# Patient Record
Sex: Female | Born: 1965 | Race: White | Hispanic: No | Smoking: Current every day smoker
Health system: Southern US, Community
[De-identification: ages and names within clinical notes are randomized; demographics above are authoritative.]

## PROBLEM LIST (undated history)

## (undated) DIAGNOSIS — I10 Essential (primary) hypertension: Secondary | ICD-10-CM

## (undated) DIAGNOSIS — J45909 Unspecified asthma, uncomplicated: Secondary | ICD-10-CM

## (undated) DIAGNOSIS — F32A Depression, unspecified: Secondary | ICD-10-CM

## (undated) DIAGNOSIS — M1990 Unspecified osteoarthritis, unspecified site: Secondary | ICD-10-CM

## (undated) DIAGNOSIS — F419 Anxiety disorder, unspecified: Secondary | ICD-10-CM

## (undated) DIAGNOSIS — R7303 Prediabetes: Secondary | ICD-10-CM

## (undated) HISTORY — DX: Unspecified asthma, uncomplicated: J45.909

## (undated) HISTORY — DX: Anxiety disorder, unspecified: F41.9

## (undated) HISTORY — PX: APPENDECTOMY: SHX54

## (undated) HISTORY — PX: CHOLECYSTECTOMY: SHX55

## (undated) HISTORY — DX: Essential (primary) hypertension: I10

## (undated) HISTORY — DX: Unspecified osteoarthritis, unspecified site: M19.90

## (undated) HISTORY — PX: TUBAL LIGATION: SHX77

## (undated) HISTORY — DX: Depression, unspecified: F32.A

## (undated) NOTE — *Deleted (*Deleted)
      Enhanced Recovery after Surgery for Orthopedics Enhanced Recovery after Surgery is a protocol used to improve the stress on your body and your recovery after surgery.  Patient Instructions  . The night before surgery:  o No food after midnight. ONLY clear liquids after midnight  . The day of surgery (if you do NOT have diabetes):  o Drink ONE (1) Pre-Surgery Clear Ensure as directed.   o This drink was given to you during your hospital  pre-op appointment visit. o The pre-op nurse will instruct you on the time to drink the  Pre-Surgery Ensure depending on your surgery time. o Finish the drink at the designated time by the pre-op nurse.  o Nothing else to drink after completing the  Pre-Surgery Clear Ensure.  . The day of surgery (if you have diabetes): o Drink ONE (1) Gatorade 2 (G2) as directed. o This drink was given to you during your hospital  pre-op appointment visit.  o The pre-op nurse will instruct you on the time to drink the   Gatorade 2 (G2) depending on your surgery time. o Color of the Gatorade may vary. Red is not allowed. o Nothing else to drink after completing the  Gatorade 2 (G2).         If you have questions, please contact your surgeon's office. 

---

## 2012-05-18 DIAGNOSIS — K3532 Acute appendicitis with perforation and localized peritonitis, without abscess: Secondary | ICD-10-CM | POA: Insufficient documentation

## 2012-05-18 DIAGNOSIS — B379 Candidiasis, unspecified: Secondary | ICD-10-CM | POA: Insufficient documentation

## 2012-05-26 DIAGNOSIS — G8918 Other acute postprocedural pain: Secondary | ICD-10-CM | POA: Insufficient documentation

## 2014-12-07 DIAGNOSIS — J189 Pneumonia, unspecified organism: Secondary | ICD-10-CM

## 2014-12-07 HISTORY — DX: Pneumonia, unspecified organism: J18.9

## 2020-08-07 ENCOUNTER — Ambulatory Visit: Payer: Self-pay | Admitting: Medical-Surgical

## 2020-08-20 ENCOUNTER — Encounter: Payer: Self-pay | Admitting: Medical-Surgical

## 2020-08-20 ENCOUNTER — Other Ambulatory Visit: Payer: Self-pay

## 2020-08-20 ENCOUNTER — Ambulatory Visit (INDEPENDENT_AMBULATORY_CARE_PROVIDER_SITE_OTHER): Payer: 59 | Admitting: Medical-Surgical

## 2020-08-20 ENCOUNTER — Ambulatory Visit (INDEPENDENT_AMBULATORY_CARE_PROVIDER_SITE_OTHER): Payer: 59

## 2020-08-20 VITALS — BP 165/107 | HR 69 | Temp 98.1°F | Ht 64.5 in | Wt 214.6 lb

## 2020-08-20 DIAGNOSIS — Z23 Encounter for immunization: Secondary | ICD-10-CM | POA: Diagnosis not present

## 2020-08-20 DIAGNOSIS — I10 Essential (primary) hypertension: Secondary | ICD-10-CM | POA: Diagnosis not present

## 2020-08-20 DIAGNOSIS — R5383 Other fatigue: Secondary | ICD-10-CM

## 2020-08-20 DIAGNOSIS — Z1231 Encounter for screening mammogram for malignant neoplasm of breast: Secondary | ICD-10-CM

## 2020-08-20 DIAGNOSIS — Z1211 Encounter for screening for malignant neoplasm of colon: Secondary | ICD-10-CM

## 2020-08-20 DIAGNOSIS — R635 Abnormal weight gain: Secondary | ICD-10-CM | POA: Diagnosis not present

## 2020-08-20 DIAGNOSIS — Z1159 Encounter for screening for other viral diseases: Secondary | ICD-10-CM

## 2020-08-20 DIAGNOSIS — Z114 Encounter for screening for human immunodeficiency virus [HIV]: Secondary | ICD-10-CM

## 2020-08-20 DIAGNOSIS — M25561 Pain in right knee: Secondary | ICD-10-CM

## 2020-08-20 DIAGNOSIS — Z7689 Persons encountering health services in other specified circumstances: Secondary | ICD-10-CM

## 2020-08-20 DIAGNOSIS — Z13228 Encounter for screening for other metabolic disorders: Secondary | ICD-10-CM

## 2020-08-20 DIAGNOSIS — Z6836 Body mass index (BMI) 36.0-36.9, adult: Secondary | ICD-10-CM

## 2020-08-20 IMAGING — DX DG KNEE COMPLETE 4+V*R*
4 series · 4 of 4 positions shown · non-contrast
Comparison: None.

CLINICAL DATA: Right knee injury 1 month ago with persistent pain.

EXAM:
RIGHT KNEE - COMPLETE 4+ VIEW

[knee ap]
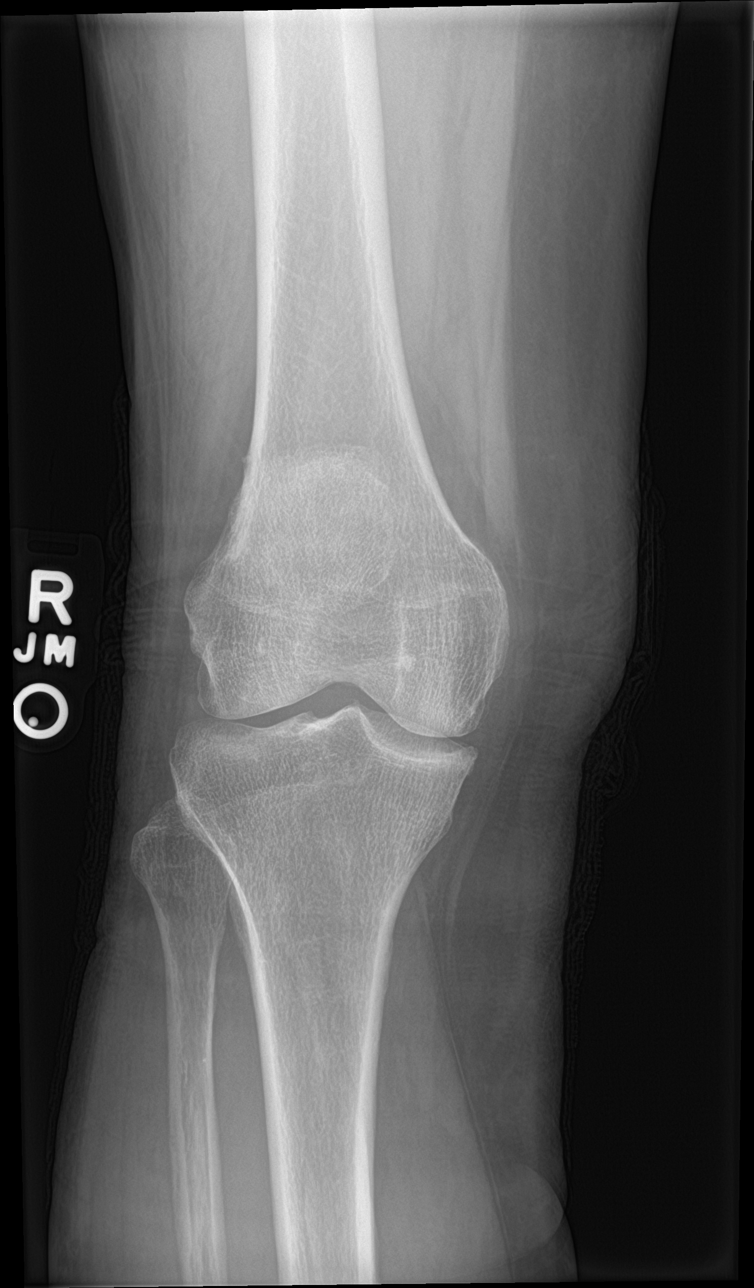

[knee lat]
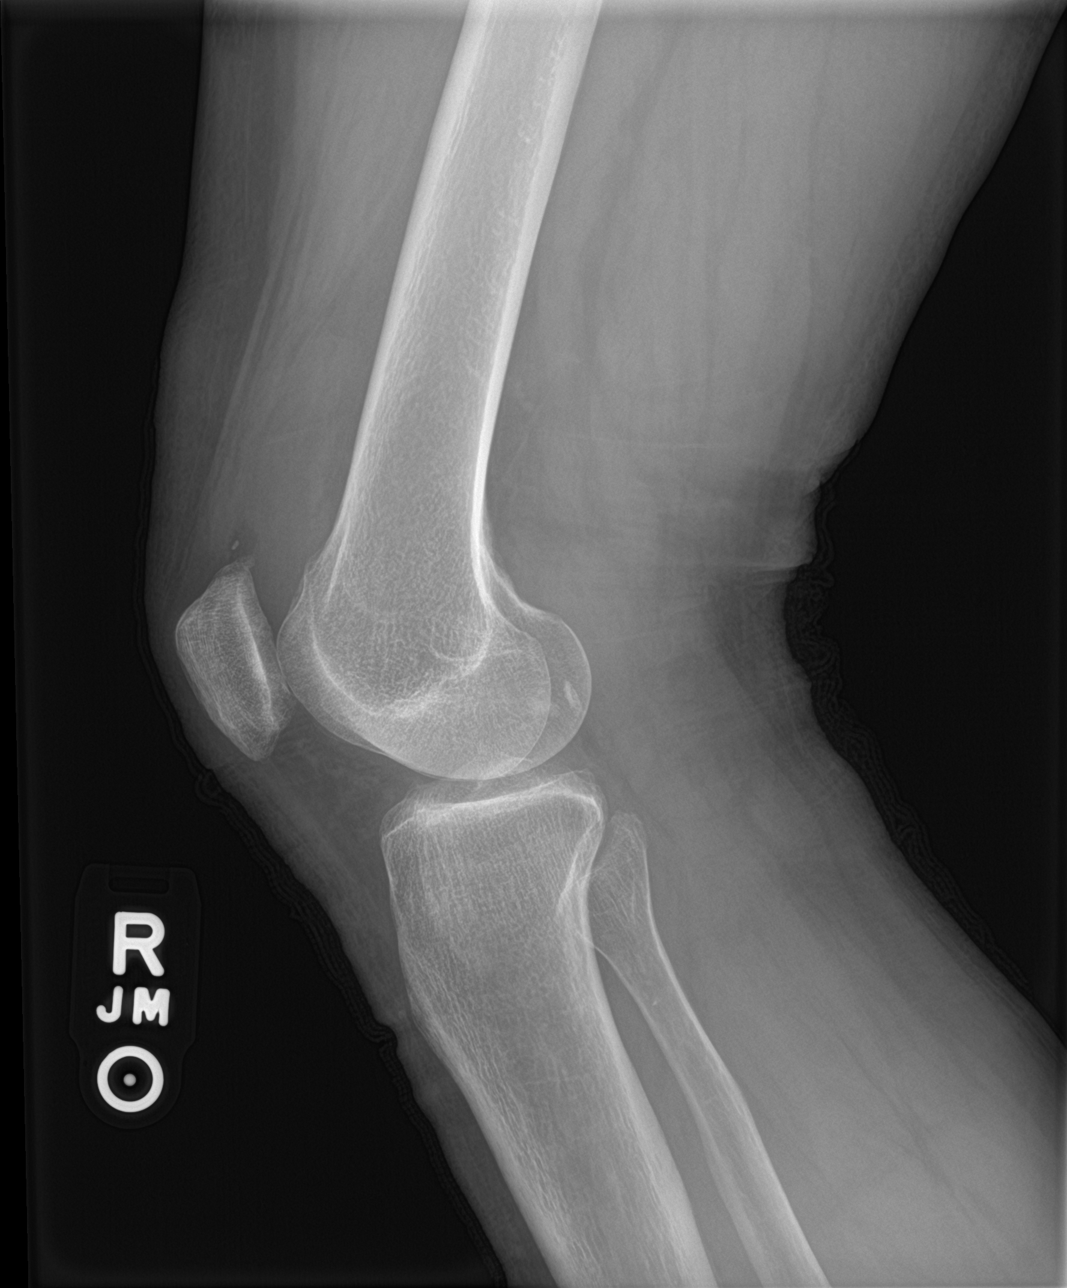

[knee obl (1 of 2)]
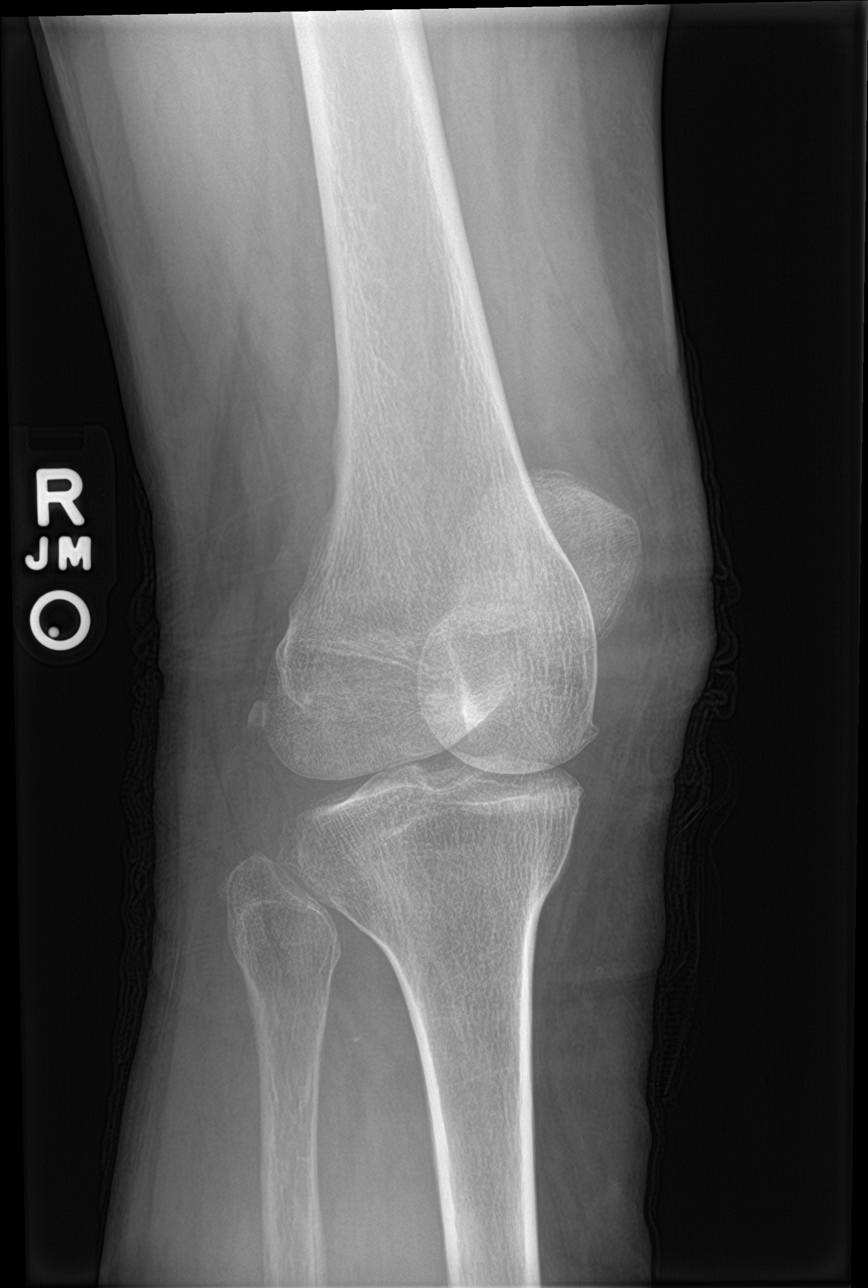

[knee obl (2 of 2)]
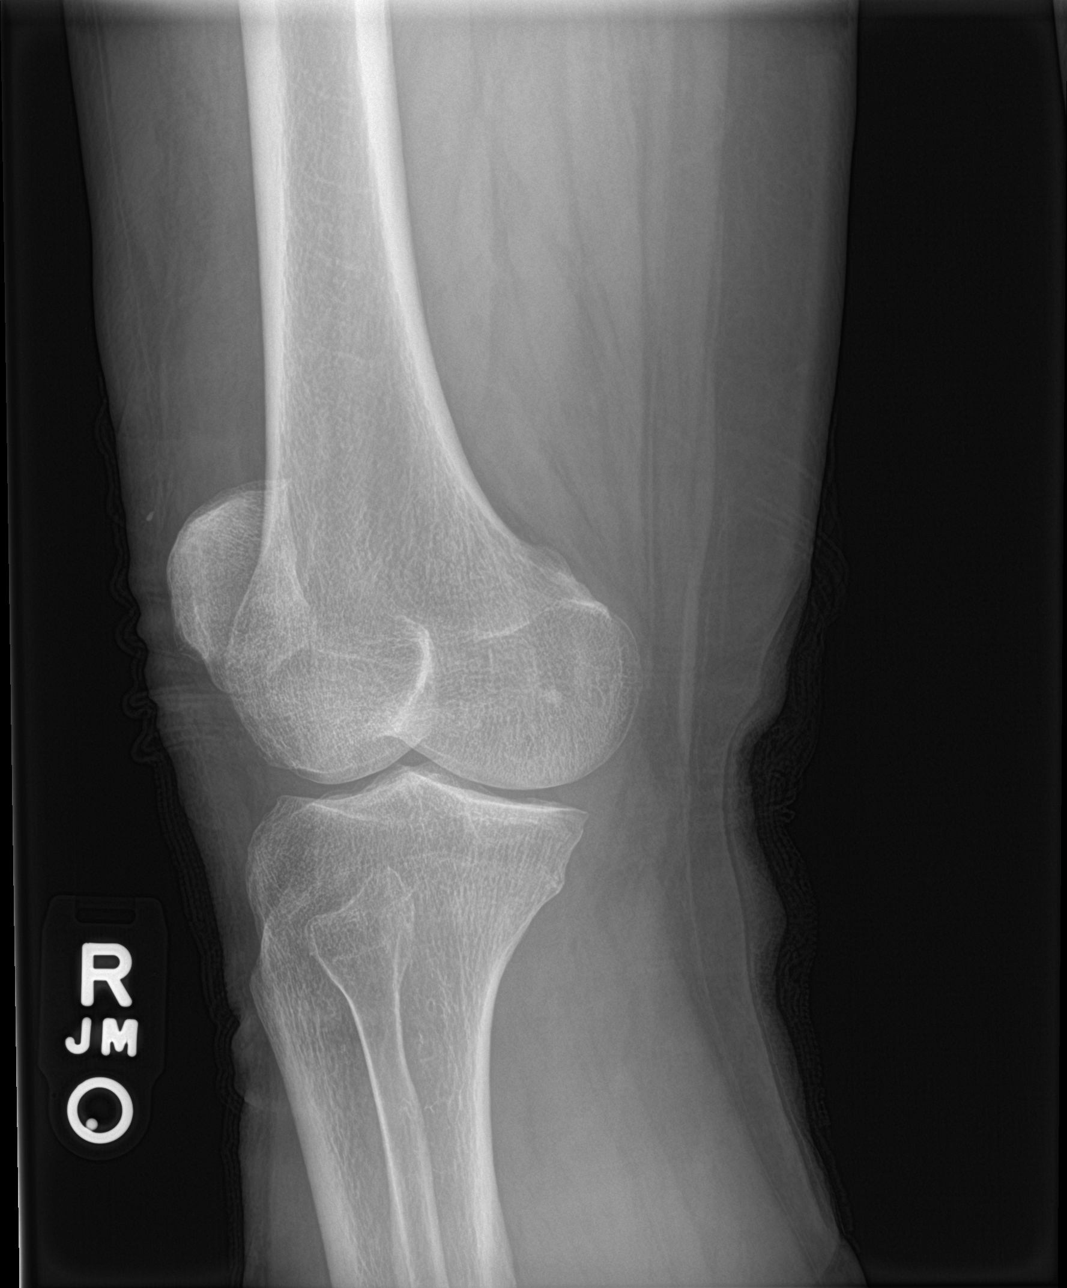

[4 of 4 positions shown; findings below may reference images not displayed]

FINDINGS: The mineralization and alignment are normal. There is no evidence of
acute fracture or dislocation. There are mild medial and
patellofemoral compartment degenerative changes. There is a moderate
size knee joint effusion.
IMPRESSION: Mild degenerative changes and moderate-sized knee joint effusion. No
acute osseous findings.

## 2020-08-20 MED ORDER — LISINOPRIL 20 MG PO TABS: 20.0000 mg | ORAL_TABLET | Freq: Every day | ORAL | 1 refills | Status: DC

## 2020-08-20 NOTE — Progress Notes (Signed)
New Patient Office Visit  Subjective:  Patient ID: Jasmine Mejia, female    DOB: 1966-05-16  Age: 54 y.o. MRN: [id number]  CC:  Chief Complaint  Patient presents with  . Establish Care    HPI Jasmine Mejia presents to establish care.  HTN- previously diagnosed with HTN and treated with Lisinopril. She thinks it was 30mg  daily but is not sure. Has been off the medication for about 8 months as she did not have insurance or a MD to prescribe the medication for her. Denies chest pain, shortness of breath, palpitations, headaches, dizziness, and vision changes.   Tobacco use- has been smoking for about 37 years. Is considering quitting but has not tried to before. Was told recently that she has COPD.   Right knee- has had right knee pain for a couple of months. Seeing an orthopedic surgeon who did a cortisone shot about a month ago. A few days after the cortisone shot, she was trying to get into the house quickly and ended up twisting her knee quite forcefully. She has since discussed her twisting injury with the orthopedic surgeon and reports they did not do any reimaging or other evaluation of the knee. Is wrapping the knee in an ACE wrap or a knee brace she got from [facility]. Notes that her ankle and lower leg swell at the end of the day. When she elevates her legs at night and unwraps the ACE, the swelling resolves. Taking Methadone 80mg  daily for pain along with Voltaren 75mg  twice daily.  Increasingly fatigued during the day. Boyfriend says she snores loudly at times and wakes herself at night when she stops breathing. Has also noticed weight gain in the past few months.   Past Medical History:  Diagnosis Date  . Anxiety   . Arthritis   . Asthma   . Depression   . Hypertension     Past Surgical History:  Procedure Laterality Date  . APPENDECTOMY    . CHOLECYSTECTOMY    . TUBAL LIGATION      Family History  Problem Relation Age of Onset  . Hypertension Mother   . Heart attack  Mother   . Diabetes Mother   . Hypertension Sister   . Heart attack Sister   . Diabetes Sister   . Hypertension Brother   . Heart attack Brother   . Diabetes Brother     Social History   Socioeconomic History  . Marital status: Single    Spouse name: Not on file  . Number of children: Not on file  . Years of education: Not on file  . Highest education level: Not on file  Occupational History  . Not on file  Tobacco Use  . Smoking status: Current Every Day Smoker    Packs/day: 1.00    Years: 37.00    Pack years: 37.00    Types: Cigarettes  . Smokeless tobacco: Never Used  Vaping Use  . Vaping Use: Never used  Substance and Sexual Activity  . Alcohol use: Not Currently  . Drug use: Never  . Sexual activity: Yes    Partners: Male    Birth control/protection: Post-menopausal  Other Topics Concern  . Not on file  Social History Narrative  . Not on file   Social Determinants of Health   Financial Resource Strain:   . Difficulty of Paying Living Expenses: Not on file  Food Insecurity:   . Worried About [place] in the Last Year: Not on file  .  Ran Out of Food in the Last Year: Not on file  Transportation Needs:   . Lack of Transportation (Medical): Not on file  . Lack of Transportation (Non-Medical): Not on file  Physical Activity:   . Days of Exercise per Week: Not on file  . Minutes of Exercise per Session: Not on file  Stress:   . Feeling of Stress : Not on file  Social Connections:   . Frequency of Communication with Friends and Family: Not on file  . Frequency of Social Gatherings with Friends and Family: Not on file  . Attends Religious Services: Not on file  . Active Member of Clubs or Organizations: Not on file  . Attends Banker Meetings: Not on file  . Marital Status: Not on file  Intimate Partner Violence:   . Fear of Current or Ex-Partner: Not on file  . Emotionally Abused: Not on file  . Physically Abused: Not on file  .  Sexually Abused: Not on file    ROS Review of Systems  Constitutional: Positive for fatigue and unexpected weight change. Negative for chills and fever.  Respiratory: Positive for shortness of breath (on exertion). Negative for cough, chest tightness and wheezing.   Cardiovascular: Positive for leg swelling. Negative for chest pain and palpitations.  Musculoskeletal: Positive for arthralgias and joint swelling.  Neurological: Negative for dizziness, light-headedness and headaches.  Psychiatric/Behavioral: Positive for sleep disturbance. Negative for dysphoric mood, self-injury and suicidal ideas. The patient is not nervous/anxious.     STOP-BANG for SLEEP APNEA Do you Snore loudly? Yes Do you often feel Tired during day? Yes Has anyone Observed you stop breathing? Yes History of high blood Pressure? Yes BMI >35? Yes Age >50? Yes Neck circumference >16 in? Yes Gender female? No 5-8 = high risk 3-4 = intermediate 0-2 = low risk   Objective:   Today's Vitals: BP (!) 165/107   Pulse 69   Temp 98.1 F (36.7 C) (Oral)   Ht 5' 4.5" (1.638 m)   Wt 214 lb 9.6 oz (97.3 kg)   SpO2 98%   BMI 36.27 kg/m   Physical Exam Vitals reviewed.  Constitutional:      General: She is not in acute distress.    Appearance: Normal appearance.  HENT:     Head: Normocephalic and atraumatic.  Cardiovascular:     Rate and Rhythm: Normal rate and regular rhythm.     Pulses: Normal pulses.     Heart sounds: Normal heart sounds. No murmur heard.  No friction rub. No gallop.   Pulmonary:     Effort: Pulmonary effort is normal. No respiratory distress.     Breath sounds: Normal breath sounds. No wheezing.  Musculoskeletal:        General: Swelling (right knee) and signs of injury (right knee) present.  Skin:    General: Skin is warm and dry.  Neurological:     Mental Status: She is alert and oriented to person, place, and time.  Psychiatric:        Mood and Affect: Mood normal.         Behavior: Behavior normal.        Thought Content: Thought content normal.        Judgment: Judgment normal.     Assessment & Plan:   1. Encounter to establish care Reviewed available information and discussed health care concerns with patient.   2. Essential hypertension Checking CBC, CMP, and lipid panel. Home sleep apnea test ordered to  evaluate for OSA.  - CBC - COMPLETE METABOLIC PANEL WITH GFR - Lipid panel - Home sleep test  3. Weight gain Checking TSH. - TSH  4. Fatigue, unspecified type Checking TSH. Home sleep apnea test ordered.  - Home sleep test  5. BMI 36.0-36.9,adult Checking Hemoglobin A1c. - Hemoglobin A1c  6. Acute pain of right knee Will get updated x-rays today for imaging after her most recent twisting injury. Discussed seeking a second opinion if she is unhappy with the orthopedic surgeon's approach and plan. Recommend seeing Dr. Karie Schwalbe here in our office for evaluation and potential treatment options discussion.  - DG Knee Complete 4 Views Right; Future  7. Screen for colon cancer Refer to GI for colonoscopy. - Ambulatory referral to Gastroenterology  8. Encounter for screening mammogram for malignant neoplasm of breast Screening mammogram ordered.  - MM DIGITAL SCREENING BILATERAL; Future  9. Encounter for screening for HIV Discussed screening recommendations. Patient agreeable so adding to blood work today. - HIV Antibody (routine testing w rflx)  10. Need for hepatitis C screening test Discussed screening recommendations. Patient agreeable so adding to blood work today.  - Hepatitis C antibody  11. Screening for metabolic disorder Checking hemoglobin a1c due to obesity and strong family history of DM. - Hemoglobin A1c  12. Need for Tdap vaccination Tdap given in office today. - Tdap vaccine greater than or equal to 52yo IM  13. Need for influenza vaccination Flu vaccine given in office today. - Flu Vaccine QUAD 36+ mos IM   Outpatient  Encounter Medications as of 08/20/2020  Medication Sig  . ARIPiprazole (ABILIFY) 5 MG tablet Take 5 mg by mouth daily.  . diclofenac (VOLTAREN) 75 MG EC tablet Take 75 mg by mouth 2 (two) times daily.  Marland Kitchen gabapentin (NEURONTIN) 600 MG tablet Take 600 mg by mouth 3 (three) times daily.  Marland Kitchen ibuprofen (ADVIL) 200 MG tablet Take 200 mg by mouth every 6 (six) hours as needed.  . methadone (DOLOPHINE) 10 MG/5ML solution Take 80 mg by mouth every 6 (six) hours as needed for pain.  . mirtazapine (REMERON) 30 MG tablet Take 30 mg by mouth at bedtime.  . Multiple Vitamin (MULTI-VITAMIN) tablet Take 1 tablet by mouth daily.  . sertraline (ZOLOFT) 100 MG tablet Take 100 mg by mouth 2 (two) times daily.  Marland Kitchen lisinopril (ZESTRIL) 20 MG tablet Take 1 tablet (20 mg total) by mouth daily.  . [DISCONTINUED] naproxen (NAPROSYN) 500 MG tablet Take 500 mg by mouth 2 (two) times daily. (Patient not taking: Reported on 08/20/2020)  . [DISCONTINUED] traMADol (ULTRAM) 50 MG tablet Take 25-50 mg by mouth every 6 (six) hours as needed. (Patient not taking: Reported on 08/20/2020)   No facility-administered encounter medications on file as of 08/20/2020.    Follow-up: Return in about 2 weeks (around 09/03/2020) for nurse visit for blood pressure check.   Thayer Ohm, DNP, APRN, FNP-BC [facility]

## 2020-08-20 NOTE — Patient Instructions (Signed)
Influenza (Flu) Vaccine (Inactivated or Recombinant): What You Need to Know 1. Why get vaccinated? Influenza vaccine can prevent influenza (flu). Flu is a contagious disease that spreads around the [place] every year, usually between October and May. Anyone can get the flu, but it is more dangerous for some people. Infants and young children, people 52 years of age and older, pregnant women, and people with certain health conditions or a weakened immune system are at greatest risk of flu complications. Pneumonia, bronchitis, sinus infections and ear infections are examples of flu-related complications. If you have a medical condition, such as heart disease, cancer or diabetes, flu can make it worse. Flu can cause fever and chills, sore throat, muscle aches, fatigue, cough, headache, and runny or stuffy nose. Some people may have vomiting and diarrhea, though this is more common in children than adults. Each year thousands of people in the [place] die from flu, and many more are hospitalized. Flu vaccine prevents millions of illnesses and flu-related visits to the doctor each year. 2. Influenza vaccine CDC recommends everyone 6 months of age and older get vaccinated every flu season. Children 6 months through 8 years of age may need 2 doses during a single flu season. Everyone else needs only 1 dose each flu season. It takes about 2 weeks for protection to develop after vaccination. There are many flu viruses, and they are always changing. Each year a new flu vaccine is made to protect against three or four viruses that are likely to cause disease in the upcoming flu season. Even when the vaccine doesn't exactly match these viruses, it may still provide some protection. Influenza vaccine does not cause flu. Influenza vaccine may be given at the same time as other vaccines. 3. Talk with your health care provider Tell your vaccine provider if the person getting the vaccine:  Has had an  allergic reaction after a previous dose of influenza vaccine, or has any severe, life-threatening allergies.  Has ever had Guillain-Barr Syndrome (also called GBS). In some cases, your health care provider may decide to postpone influenza vaccination to a future visit. People with minor illnesses, such as a cold, may be vaccinated. People who are moderately or severely ill should usually wait until they recover before getting influenza vaccine. Your health care provider can give you more information. 4. Risks of a vaccine reaction  Soreness, redness, and swelling where shot is given, fever, muscle aches, and headache can happen after influenza vaccine.  There may be a very small increased risk of Guillain-Barr Syndrome (GBS) after inactivated influenza vaccine (the flu shot). Young children who get the flu shot along with pneumococcal vaccine (PCV13), and/or DTaP vaccine at the same time might be slightly more likely to have a seizure caused by fever. Tell your health care provider if a child who is getting flu vaccine has ever had a seizure. People sometimes faint after medical procedures, including vaccination. Tell your provider if you feel dizzy or have vision changes or ringing in the ears. As with any medicine, there is a very remote chance of a vaccine causing a severe allergic reaction, other serious injury, or death. 5. What if there is a serious problem? An allergic reaction could occur after the vaccinated person leaves the clinic. If you see signs of a severe allergic reaction (hives, swelling of the face and throat, difficulty breathing, a fast heartbeat, dizziness, or weakness), call 9-1-1 and get the person to the nearest hospital. For other signs that   concern you, call your health care provider. Adverse reactions should be reported to the Vaccine Adverse Event Reporting System (VAERS). Your health care provider will usually file this report, or you can do it yourself. Visit the  VAERS website at www.vaers.hhs.gov or call 1-800-822-7967.VAERS is only for reporting reactions, and VAERS staff do not give medical advice. 6. The National Vaccine Injury Compensation Program The National Vaccine Injury Compensation Program (VICP) is a federal program that was created to compensate people who may have been injured by certain vaccines. Visit the VICP website at www.hrsa.gov/vaccinecompensation or call 1-800-338-2382 to learn about the program and about filing a claim. There is a time limit to file a claim for compensation. 7. How can I learn more?  Ask your healthcare provider.  Call your local or state health department.  Contact the Centers for Disease Control and Prevention (CDC): ? Call 1-800-232-4636 (1-800-CDC-INFO) or ? Visit CDC's www.cdc.gov/flu Vaccine Information Statement (Interim) Inactivated Influenza Vaccine (07/21/2018) This information is not intended to replace advice given to you by your health care provider. Make sure you discuss any questions you have with your health care provider. Document Revised: 03/14/2019 Document Reviewed: 07/25/2018 Elsevier Patient Education  2020 Elsevier Inc.  https://www.cdc.gov/vaccines/hcp/vis/vis-statements/tdap.pdf">  Tdap (Tetanus, Diphtheria, Pertussis) Vaccine: What You Need to Know 1. Why get vaccinated? Tdap vaccine can prevent tetanus, diphtheria, and pertussis. Diphtheria and pertussis spread from person to person. Tetanus enters the body through cuts or wounds.  TETANUS (T) causes painful stiffening of the muscles. Tetanus can lead to serious health problems, including being unable to open the mouth, having trouble swallowing and breathing, or death.  DIPHTHERIA (D) can lead to difficulty breathing, heart failure, paralysis, or death.  PERTUSSIS (aP), also known as "whooping cough," can cause uncontrollable, violent coughing which makes it hard to breathe, eat, or drink. Pertussis can be extremely serious in  babies and young children, causing pneumonia, convulsions, brain damage, or death. In teens and adults, it can cause weight loss, loss of bladder control, passing out, and rib fractures from severe coughing. 2. Tdap vaccine Tdap is only for children 7 years and older, adolescents, and adults.  Adolescents should receive a single dose of Tdap, preferably at age 11 or 12 years. Pregnant women should get a dose of Tdap during every pregnancy, to protect the newborn from pertussis. Infants are most at risk for severe, life-threatening complications from pertussis. Adults who have never received Tdap should get a dose of Tdap. Also, adults should receive a booster dose every 10 years, or earlier in the case of a severe and dirty wound or burn. Booster doses can be either Tdap or Td (a different vaccine that protects against tetanus and diphtheria but not pertussis). Tdap may be given at the same time as other vaccines. 3. Talk with your health care provider Tell your vaccine provider if the person getting the vaccine:  Has had an allergic reaction after a previous dose of any vaccine that protects against tetanus, diphtheria, or pertussis, or has any severe, life-threatening allergies.  Has had a coma, decreased level of consciousness, or prolonged seizures within 7 days after a previous dose of any pertussis vaccine (DTP, DTaP, or Tdap).  Has seizures or another nervous system problem.  Has ever had Guillain-Barr Syndrome (also called GBS).  Has had severe pain or swelling after a previous dose of any vaccine that protects against tetanus or diphtheria. In some cases, your health care provider may decide to postpone Tdap vaccination to a   future visit.  People with minor illnesses, such as a cold, may be vaccinated. People who are moderately or severely ill should usually wait until they recover before getting Tdap vaccine.  Your health care provider can give you more information. 4. Risks of a  vaccine reaction  Pain, redness, or swelling where the shot was given, mild fever, headache, feeling tired, and nausea, vomiting, diarrhea, or stomachache sometimes happen after Tdap vaccine. People sometimes faint after medical procedures, including vaccination. Tell your provider if you feel dizzy or have vision changes or ringing in the ears.  As with any medicine, there is a very remote chance of a vaccine causing a severe allergic reaction, other serious injury, or death. 5. What if there is a serious problem? An allergic reaction could occur after the vaccinated person leaves the clinic. If you see signs of a severe allergic reaction (hives, swelling of the face and throat, difficulty breathing, a fast heartbeat, dizziness, or weakness), call 9-1-1 and get the person to the nearest hospital. For other signs that concern you, call your health care provider.  Adverse reactions should be reported to the Vaccine Adverse Event Reporting System (VAERS). Your health care provider will usually file this report, or you can do it yourself. Visit the VAERS website at www.vaers.hhs.gov or call 1-800-822-7967. VAERS is only for reporting reactions, and VAERS staff do not give medical advice. 6. The National Vaccine Injury Compensation Program The National Vaccine Injury Compensation Program (VICP) is a federal program that was created to compensate people who may have been injured by certain vaccines. Visit the VICP website at www.hrsa.gov/vaccinecompensation or call 1-800-338-2382 to learn about the program and about filing a claim. There is a time limit to file a claim for compensation. 7. How can I learn more?  Ask your health care provider.  Call your local or state health department.  Contact the Centers for Disease Control and Prevention (CDC): ? Call 1-800-232-4636 (1-800-CDC-INFO) or ? Visit CDC's website at www.cdc.gov/vaccines Vaccine Information Statement Tdap (Tetanus, Diphtheria, Pertussis)  Vaccine (03/08/2019) This information is not intended to replace advice given to you by your health care provider. Make sure you discuss any questions you have with your health care provider. Document Revised: 03/17/2019 Document Reviewed: 03/20/2019 Elsevier Patient Education  2020 Elsevier Inc.   

## 2020-08-22 LAB — COMPLETE METABOLIC PANEL WITH GFR
AG Ratio: 1.6 (calc) (ref 1.0–2.5)
ALT: 23 U/L (ref 6–29)
AST: 20 U/L (ref 10–35)
Albumin: 3.9 g/dL (ref 3.6–5.1)
Alkaline phosphatase (APISO): 70 U/L (ref 37–153)
BUN: 16 mg/dL (ref 7–25)
CO2: 27 mmol/L (ref 20–32)
Calcium: 9.1 mg/dL (ref 8.6–10.4)
Chloride: 104 mmol/L (ref 98–110)
Creat: 0.89 mg/dL (ref 0.50–1.05)
GFR, Est African American: 87 mL/min/{1.73_m2} (ref >=60)
GFR, Est Non African American: 75 mL/min/{1.73_m2} (ref >=60)
Globulin: 2.5 g/dL (calc) (ref 1.9–3.7)
Glucose, Bld: 110 mg/dL — ABNORMAL HIGH (ref 65–99)
Potassium: 4.3 mmol/L (ref 3.5–5.3)
Sodium: 140 mmol/L (ref 135–146)
Total Bilirubin: 0.2 mg/dL (ref 0.2–1.2)
Total Protein: 6.4 g/dL (ref 6.1–8.1)

## 2020-08-22 LAB — CBC
HCT: 42.8 % (ref 35.0–45.0)
Hemoglobin: 14 g/dL (ref 11.7–15.5)
MCH: 29.8 pg (ref 27.0–33.0)
MCHC: 32.7 g/dL (ref 32.0–36.0)
MCV: 91.1 fL (ref 80.0–100.0)
MPV: 10 fL (ref 7.5–12.5)
Platelets: 306 10*3/uL (ref 140–400)
RBC: 4.7 10*6/uL (ref 3.80–5.10)
RDW: 12.9 % (ref 11.0–15.0)
WBC: 6.9 10*3/uL (ref 3.8–10.8)

## 2020-08-22 LAB — LIPID PANEL
Cholesterol: 211 mg/dL — ABNORMAL HIGH (ref <200)
HDL: 48 mg/dL — ABNORMAL LOW (ref >=50)
LDL Cholesterol (Calc): 130 mg/dL (calc) — ABNORMAL HIGH
Non-HDL Cholesterol (Calc): 163 mg/dL (calc) — ABNORMAL HIGH (ref <130)
Total CHOL/HDL Ratio: 4.4 (calc) (ref <5.0)
Triglycerides: 191 mg/dL — ABNORMAL HIGH (ref <150)

## 2020-08-22 LAB — HEMOGLOBIN A1C
Hgb A1c MFr Bld: 6 % of total Hgb — ABNORMAL HIGH (ref <5.7)
Mean Plasma Glucose: 126 (calc)
eAG (mmol/L): 7 (calc)

## 2020-08-22 LAB — HEPATITIS C ANTIBODY
Hepatitis C Ab: NONREACTIVE
SIGNAL TO CUT-OFF: 0.01 (ref <1.00)

## 2020-08-22 LAB — HIV ANTIBODY (ROUTINE TESTING W REFLEX): HIV 1&2 Ab, 4th Generation: NONREACTIVE

## 2020-08-22 LAB — TSH: TSH: 2.42 mIU/L

## 2020-08-23 ENCOUNTER — Encounter: Payer: Self-pay | Admitting: Medical-Surgical

## 2020-08-23 DIAGNOSIS — J45909 Unspecified asthma, uncomplicated: Secondary | ICD-10-CM | POA: Insufficient documentation

## 2020-08-23 DIAGNOSIS — M1712 Unilateral primary osteoarthritis, left knee: Secondary | ICD-10-CM | POA: Insufficient documentation

## 2020-08-23 DIAGNOSIS — F419 Anxiety disorder, unspecified: Secondary | ICD-10-CM | POA: Insufficient documentation

## 2020-08-23 DIAGNOSIS — R7303 Prediabetes: Secondary | ICD-10-CM | POA: Insufficient documentation

## 2020-08-23 DIAGNOSIS — I10 Essential (primary) hypertension: Secondary | ICD-10-CM | POA: Insufficient documentation

## 2020-08-23 DIAGNOSIS — E782 Mixed hyperlipidemia: Secondary | ICD-10-CM | POA: Insufficient documentation

## 2020-08-23 DIAGNOSIS — M1711 Unilateral primary osteoarthritis, right knee: Secondary | ICD-10-CM | POA: Insufficient documentation

## 2020-08-23 DIAGNOSIS — F32A Depression, unspecified: Secondary | ICD-10-CM | POA: Insufficient documentation

## 2020-08-23 MED ORDER — ATORVASTATIN CALCIUM 20 MG PO TABS: 20.0000 mg | ORAL_TABLET | Freq: Every day | ORAL | 3 refills | Status: DC

## 2020-08-23 NOTE — Addendum Note (Signed)
Addended byChristen Butter on: 08/23/2020 03:31 PM   Modules accepted: Orders

## 2020-08-26 ENCOUNTER — Telehealth: Payer: Self-pay | Admitting: Medical-Surgical

## 2020-08-26 NOTE — Telephone Encounter (Signed)
Thank you :)

## 2020-08-26 NOTE — Telephone Encounter (Signed)
Referral has already been entered and authorized.

## 2020-08-26 NOTE — Telephone Encounter (Signed)
Pt sent ms via mychart for referral for colonoscopy.  Thanks

## 2020-08-26 NOTE — Telephone Encounter (Signed)
Patient aware that the referral has been entered and authorized. Informed pt that someone from GI will be calling her for scheduling. No further questions or concerns at this time.

## 2020-08-29 ENCOUNTER — Other Ambulatory Visit: Payer: Self-pay | Admitting: Medical-Surgical

## 2020-08-29 DIAGNOSIS — Z1231 Encounter for screening mammogram for malignant neoplasm of breast: Secondary | ICD-10-CM

## 2020-09-03 ENCOUNTER — Ambulatory Visit: Payer: 59

## 2020-09-03 ENCOUNTER — Encounter: Payer: Self-pay | Admitting: Sports Medicine

## 2020-09-03 ENCOUNTER — Ambulatory Visit (INDEPENDENT_AMBULATORY_CARE_PROVIDER_SITE_OTHER): Payer: 59

## 2020-09-03 ENCOUNTER — Ambulatory Visit (INDEPENDENT_AMBULATORY_CARE_PROVIDER_SITE_OTHER): Payer: 59 | Admitting: Sports Medicine

## 2020-09-03 DIAGNOSIS — M25561 Pain in right knee: Secondary | ICD-10-CM

## 2020-09-03 DIAGNOSIS — G8929 Other chronic pain: Secondary | ICD-10-CM | POA: Diagnosis not present

## 2020-09-03 MED ORDER — MELOXICAM 15 MG PO TABS: ORAL_TABLET | ORAL | 3 refills | Status: DC

## 2020-09-03 NOTE — Assessment & Plan Note (Addendum)
This is a pleasant 54 year old female, she has had a number of injuries to her knee, she was ultimately seen at orthopedic practice, she had an injection that provided some good relief, unfortunately she had a reinjury. X-rays at the time showed osteoarthritis. They also try to get Synvisc/Durolane approved, this was approved but out-of-pocket cost were too high. Today she has an effusion, pain at the medial joint line and some locking, we did an aspiration and injection today, due to the locking we are going to proceed with an MRI looking for a meniscal tear. Switching from diclofenac to meloxicam, return to see me in a month. I did explain to her that she did have osteoarthritis with unhealthy cartilage, if we saw a large meniscal tear we could certainly refer for arthroscopy but that ultimately there is no cure for osteoarthritis but we would do everything possible to keep her out of the OR for arthroplasty. Also doing some physical therapy.  Ultimately she did not improve significantly from the injection, MRI obtained and available in [facility] PACS, referral to Dr. Everardo Pacific for surgical opinion, if she does end up with an arthroscopic debridement we could certainly follow this up with viscosupplementation.

## 2020-09-03 NOTE — Progress Notes (Addendum)
    Procedures performed today:    Procedure: Real-time Ultrasound Guided aspiration/injection of right knee Device: Samsung HS60  Verbal informed consent obtained.  Time-out conducted.  Noted no overlying erythema, induration, or other signs of local infection.  Skin prepped in a sterile fashion.  Local anesthesia: Topical Ethyl chloride.  With sterile technique and under real time ultrasound guidance:  Using 18-gauge needle aspirated 22 cc of clear, straw-colored fluid, syringe switched and 1 cc Kenalog 40, 2 cc lidocaine, 2 cc bupivacaine injected easily Completed without difficulty  Pain immediately resolved suggesting accurate placement of the medication.  Advised to call if fevers/chills, erythema, induration, drainage, or persistent bleeding.  Images permanently stored and available for review in PACS.  Impression: Technically successful ultrasound guided injection.  Independent interpretation of notes and tests performed by another provider:   None.  Brief History, Exam, Impression, and Recommendations:    Right knee pain This is a pleasant 54 year old female, she has had a number of injuries to her knee, she was ultimately seen at orthopedic practice, she had an injection that provided some good relief, unfortunately she had a reinjury. X-rays at the time showed osteoarthritis. They also try to get Synvisc/Durolane approved, this was approved but out-of-pocket cost were too high. Today she has an effusion, pain at the medial joint line and some locking, we did an aspiration and injection today, due to the locking we are going to proceed with an MRI looking for a meniscal tear. Switching from diclofenac to meloxicam, return to see me in a month. I did explain to her that she did have osteoarthritis with unhealthy cartilage, if we saw a large meniscal tear we could certainly refer for arthroscopy but that ultimately there is no cure for osteoarthritis but we would do everything  possible to keep her out of the OR for arthroplasty. Also doing some physical therapy.  Ultimately she did not improve significantly from the injection, MRI obtained and available in [facility] PACS, referral to Dr. Everardo Pacific for surgical opinion, if she does end up with an arthroscopic debridement we could certainly follow this up with viscosupplementation.    ___________________________________________ Ihor Austin. Benjamin Stain, M.D., ABFM., CAQSM. Primary Care and Sports Medicine [facility]  Adjunct Instructor of Family Medicine  [employer]

## 2020-09-04 ENCOUNTER — Ambulatory Visit: Payer: 59 | Admitting: Sports Medicine

## 2020-09-04 ENCOUNTER — Encounter: Payer: Self-pay | Admitting: Gastroenterology

## 2020-09-05 ENCOUNTER — Ambulatory Visit (INDEPENDENT_AMBULATORY_CARE_PROVIDER_SITE_OTHER): Payer: 59

## 2020-09-05 ENCOUNTER — Other Ambulatory Visit: Payer: Self-pay

## 2020-09-05 DIAGNOSIS — Z1231 Encounter for screening mammogram for malignant neoplasm of breast: Secondary | ICD-10-CM | POA: Diagnosis not present

## 2020-09-05 IMAGING — MG DIGITAL SCREENING BILAT W/ TOMO W/ CAD
6 of 12 series · 6 of 36 positions shown · non-contrast
Comparison: None.

CLINICAL DATA: Screening.

EXAM:
DIGITAL SCREENING BILATERAL MAMMOGRAM WITH TOMO AND CAD

[R XCCL synth-2D]
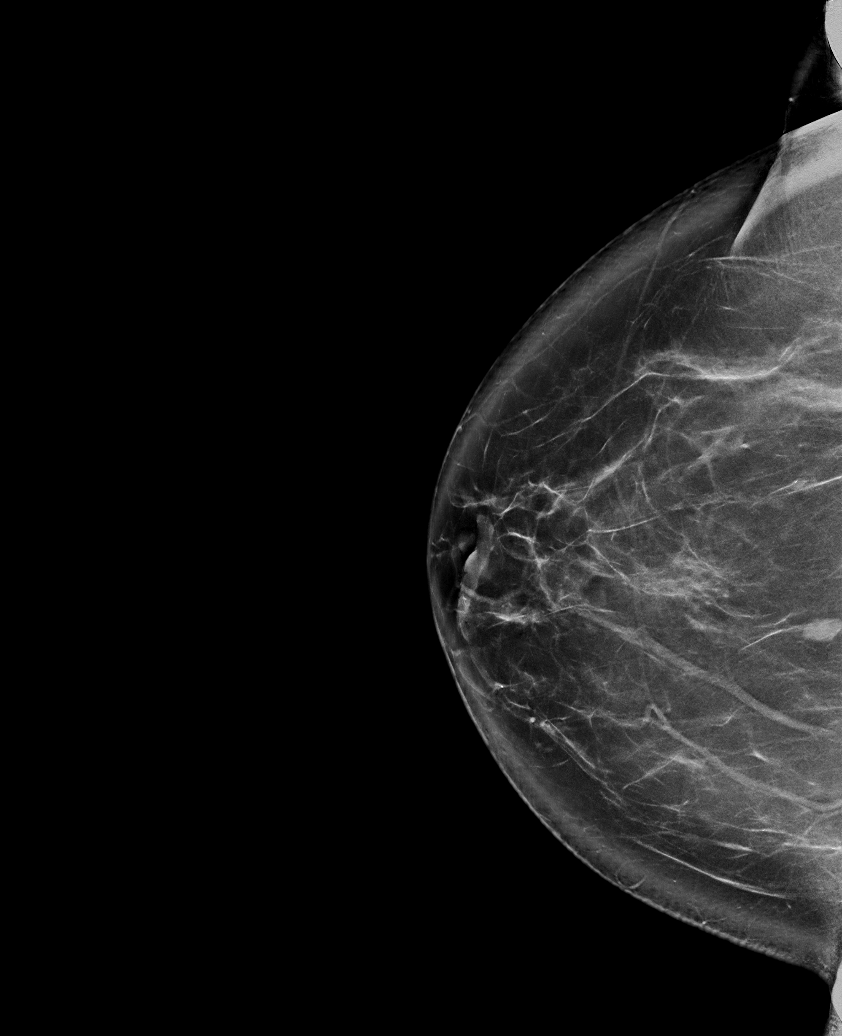

[R CC synth-2D]
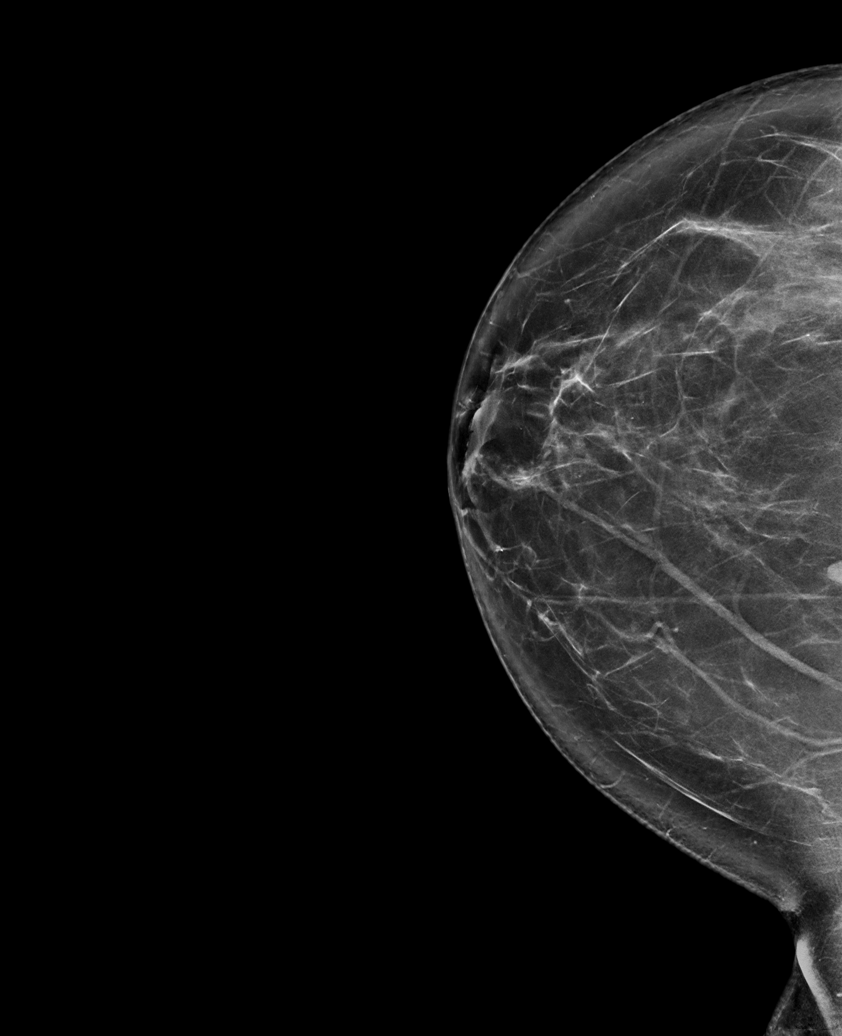

[L XCCL synth-2D]
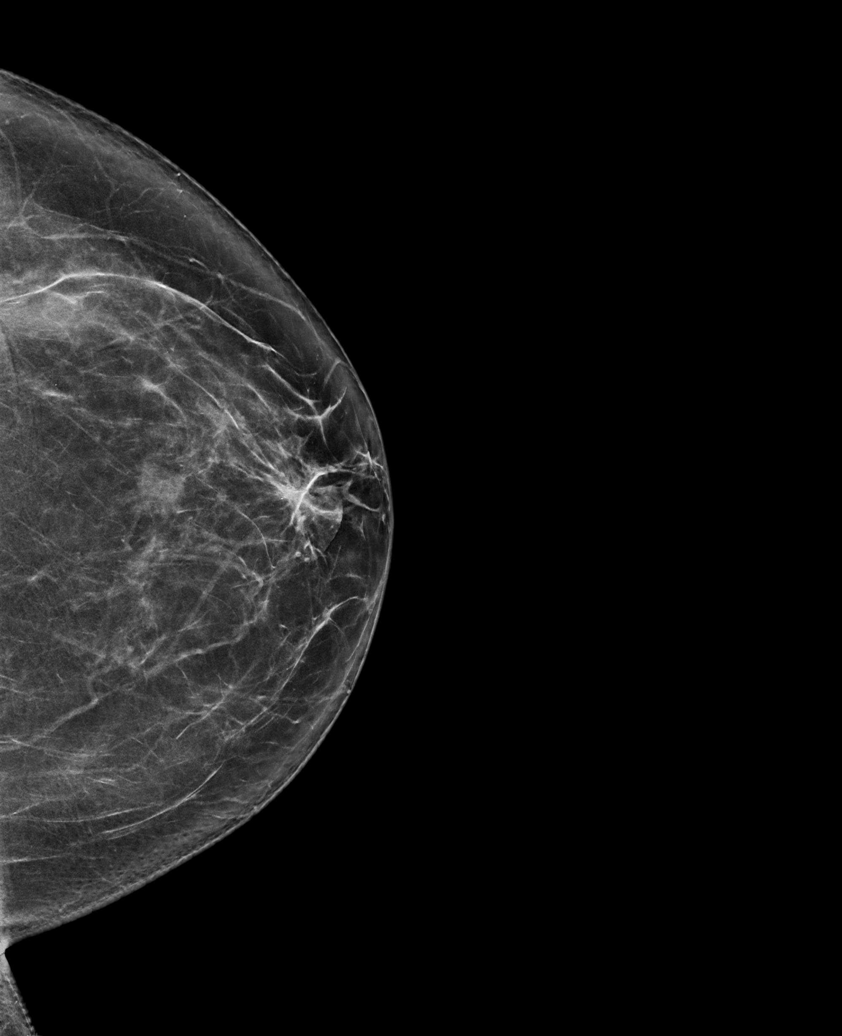

[R MLO synth-2D]
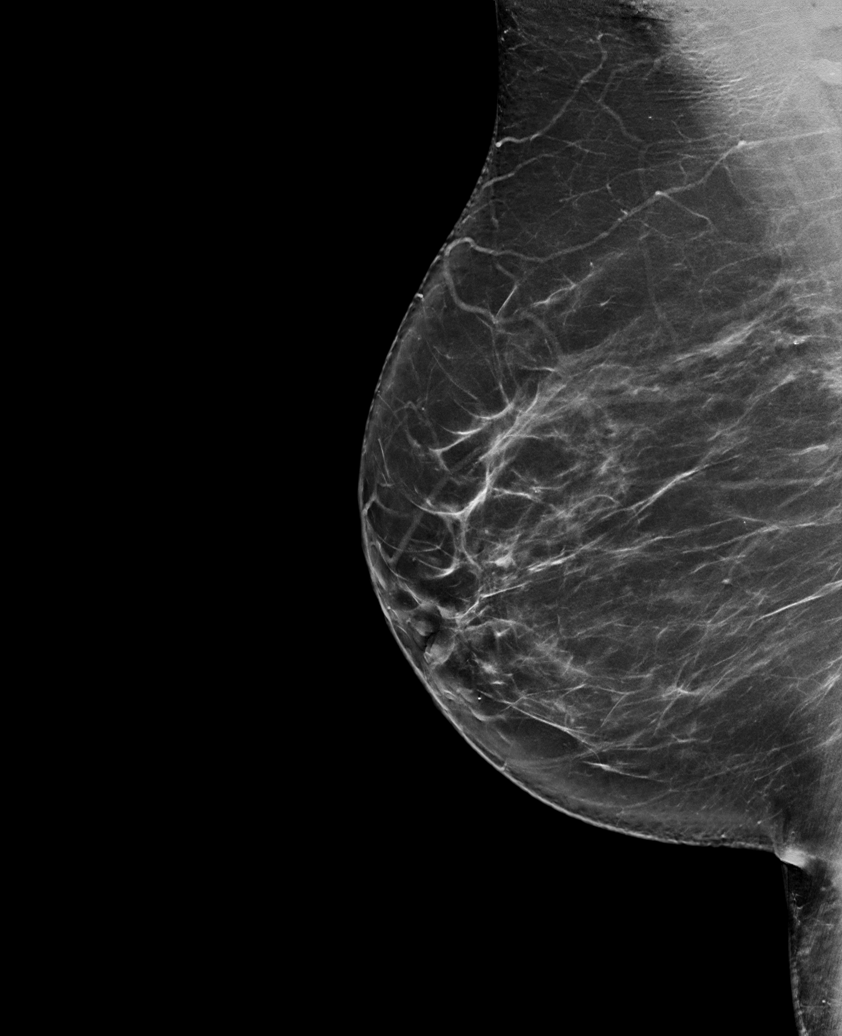

[L CC synth-2D]
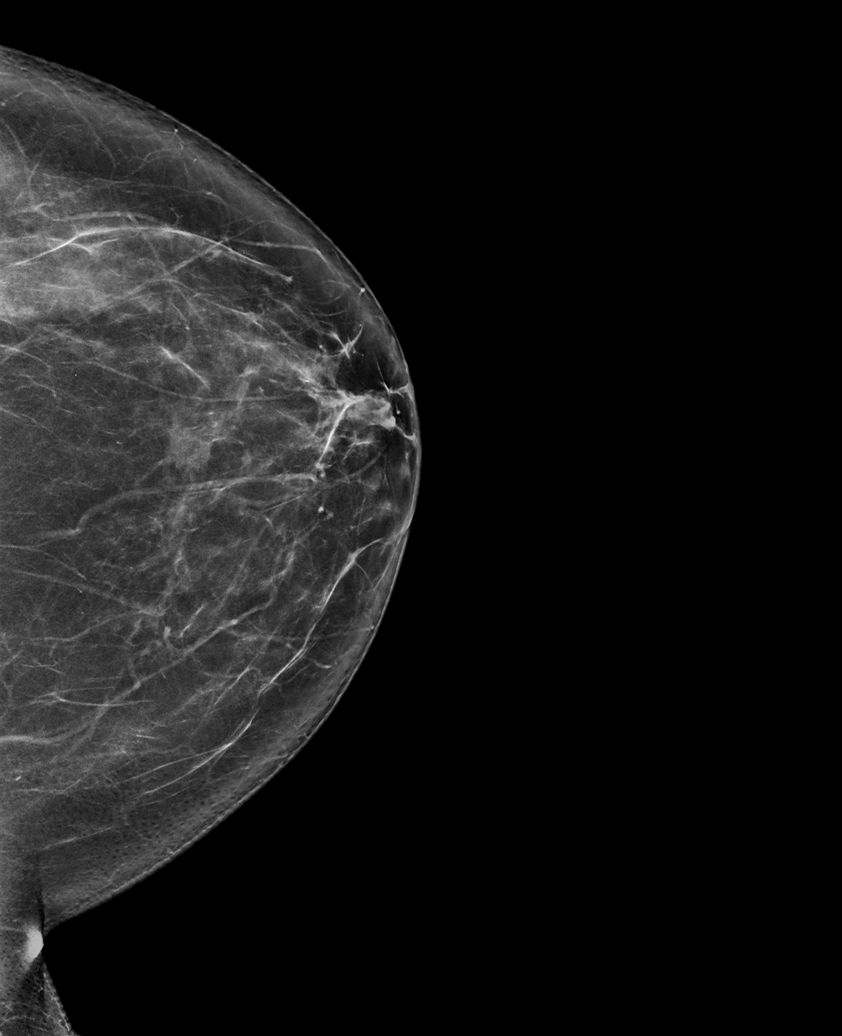

[L MLO synth-2D]
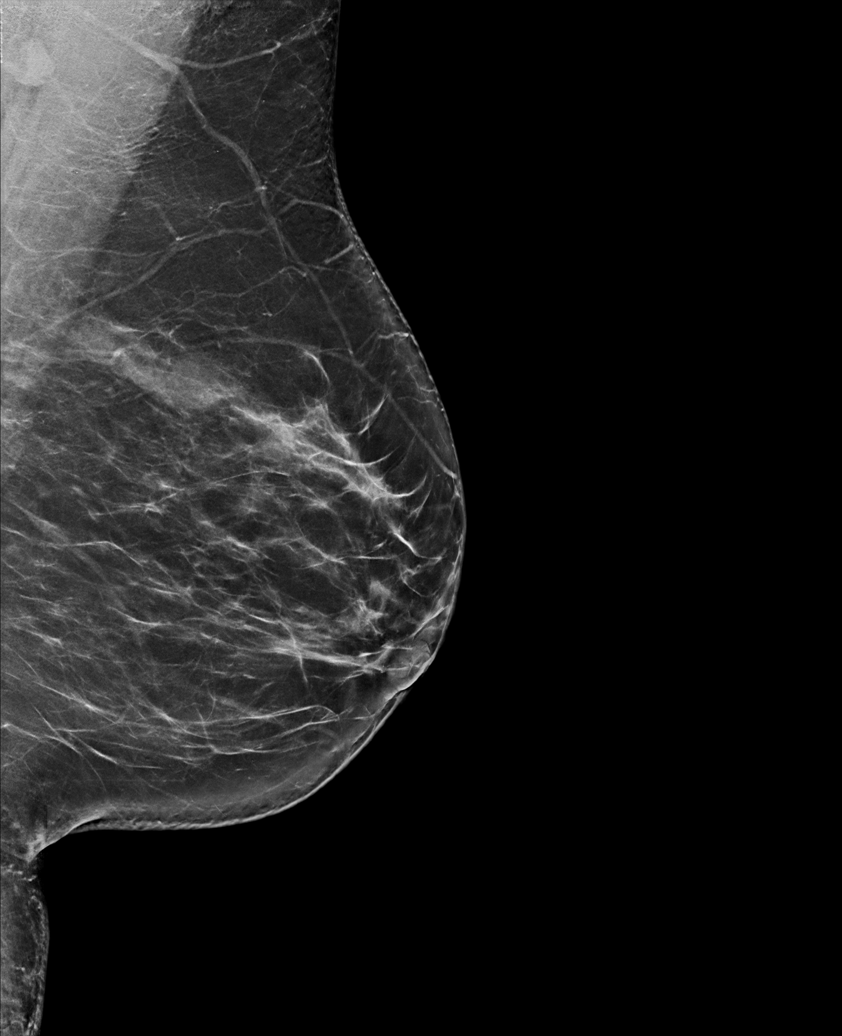

[6 of 36 positions shown; findings below may reference images not displayed]

ACR Breast Density Category b: There are scattered areas of
fibroglandular density.
FINDINGS: In the right breast a possible mass requires further evaluation.

In the left breast an asymmetry requires further evaluation.

Images were processed with CAD.
IMPRESSION: Further evaluation is suggested for a possible mass in the right
breast.

Further evaluation is suggested for an asymmetry in the left breast.

RECOMMENDATION:
Diagnostic mammogram and possibly ultrasound of both breasts.
(Code:[AF])

The patient will be contacted regarding the findings, and additional
imaging will be scheduled.

BI-RADS CATEGORY  0: Incomplete. Need additional imaging evaluation
and/or prior mammograms for comparison.

## 2020-09-09 ENCOUNTER — Other Ambulatory Visit: Payer: Self-pay | Admitting: Medical-Surgical

## 2020-09-09 ENCOUNTER — Other Ambulatory Visit: Payer: Self-pay

## 2020-09-09 ENCOUNTER — Ambulatory Visit (INDEPENDENT_AMBULATORY_CARE_PROVIDER_SITE_OTHER): Payer: 59

## 2020-09-09 DIAGNOSIS — X501XXA Overexertion from prolonged static or awkward postures, initial encounter: Secondary | ICD-10-CM

## 2020-09-09 DIAGNOSIS — M7121 Synovial cyst of popliteal space [Baker], right knee: Secondary | ICD-10-CM | POA: Diagnosis not present

## 2020-09-09 DIAGNOSIS — S83241A Other tear of medial meniscus, current injury, right knee, initial encounter: Secondary | ICD-10-CM

## 2020-09-09 DIAGNOSIS — R928 Other abnormal and inconclusive findings on diagnostic imaging of breast: Secondary | ICD-10-CM

## 2020-09-09 DIAGNOSIS — S83281A Other tear of lateral meniscus, current injury, right knee, initial encounter: Secondary | ICD-10-CM | POA: Diagnosis not present

## 2020-09-09 DIAGNOSIS — S83141A Lateral subluxation of proximal end of tibia, right knee, initial encounter: Secondary | ICD-10-CM

## 2020-09-09 IMAGING — MR MR KNEE*R* W/O CM
7 series · 40 of 40 positions shown · non-contrast
Comparison: Radiographs [DATE]

CLINICAL DATA: Twisting right knee injury 3 months ago. Medial knee
pain.

EXAM:
MRI OF THE RIGHT KNEE WITHOUT CONTRAST
TECHNIQUE: Multiplanar, multisequence MR imaging of the knee was performed. No
intravenous contrast was administered.

[Series 7: T2 fat-sat · axial · 4.0mm · 0.53mm/px · z∈[-74,+71]mm · 6 of 30 slices shown (1 of 3)]
[im 1/30]
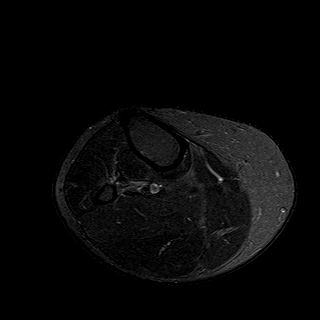
[im 6/30]
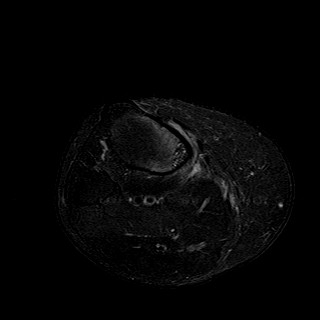
[im 12/30]
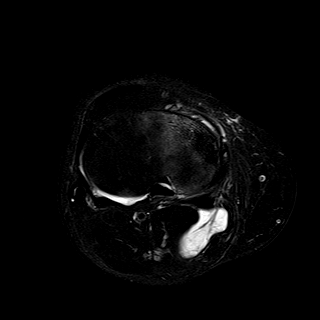
[im 18/30]
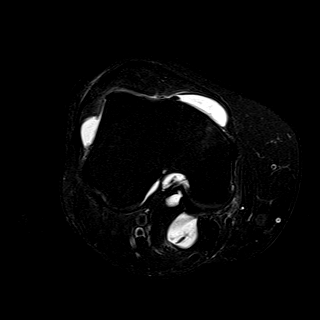
[im 24/30]
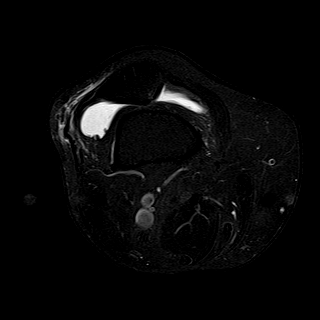
[im 30/30]
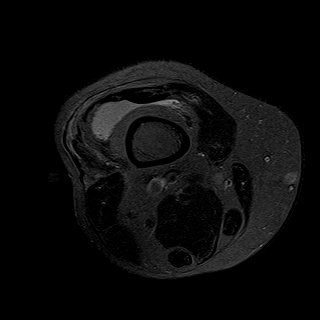

[Series 8: T1 · coronal · 4.0mm · 0.62mm/px · 6 of 27 slices shown]
[im 1/27]
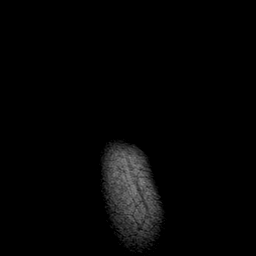
[im 6/27]
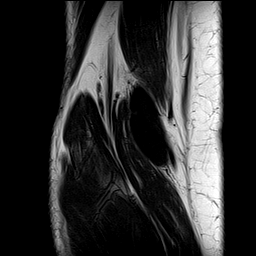
[im 11/27]
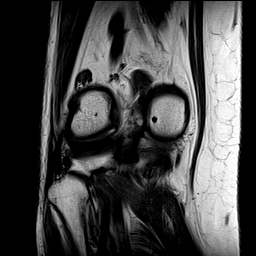
[im 16/27]
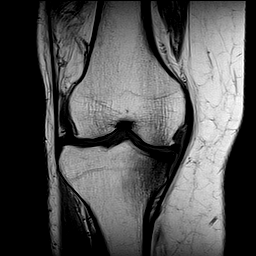
[im 21/27]
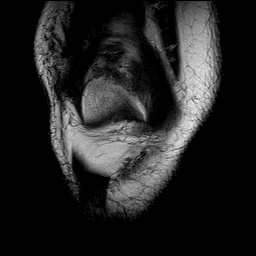
[im 27/27]
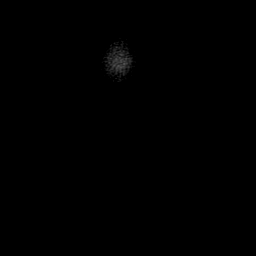

[Series 9: T2 fat-sat · sagittal · 3.0mm · 0.62mm/px · 6 of 28 slices shown (2 of 3)]
[im 1/28]
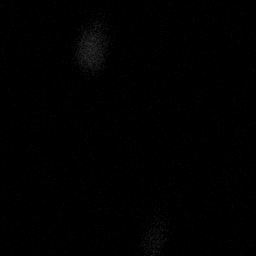
[im 6/28]
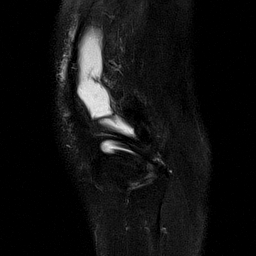
[im 11/28]
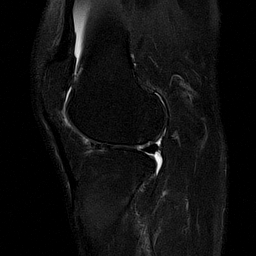
[im 17/28]
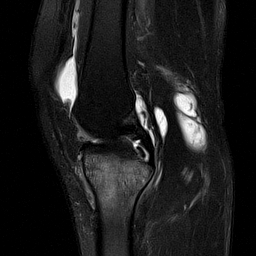
[im 22/28]
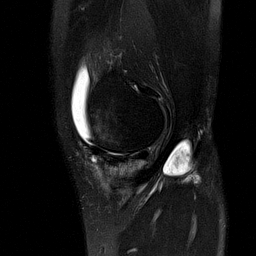
[im 28/28]
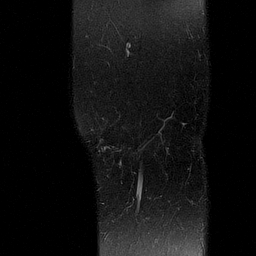

[Series 10: T2 fat-sat · coronal · 4.0mm · 0.62mm/px · 6 of 27 slices shown (3 of 3)]
[im 1/27]
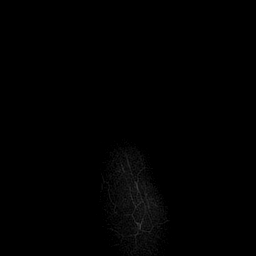
[im 6/27]
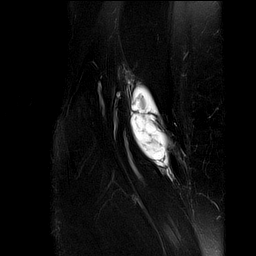
[im 11/27]
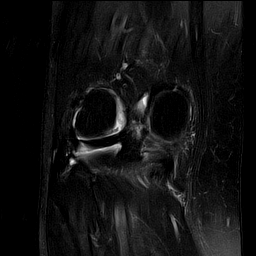
[im 16/27]
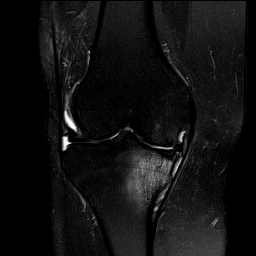
[im 21/27]
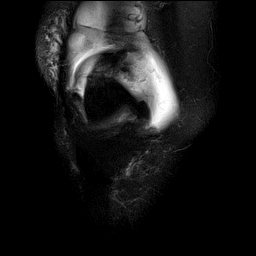
[im 27/27]
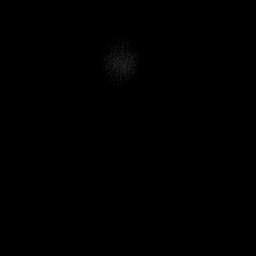

[Series 11: PD fat-sat · sagittal · 3.0mm · 0.62mm/px · 6 of 28 slices shown (1 of 3)]
[im 1/28]
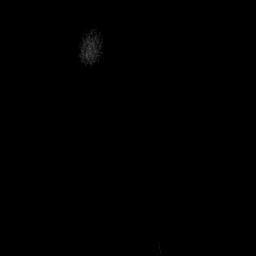
[im 6/28]
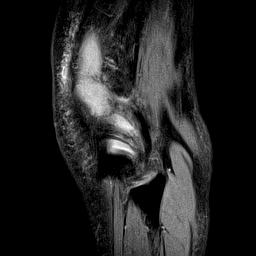
[im 11/28]
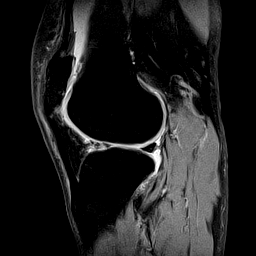
[im 17/28]
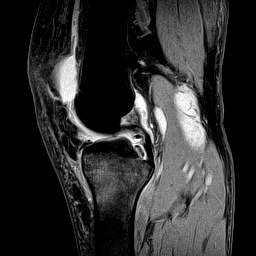
[im 22/28]
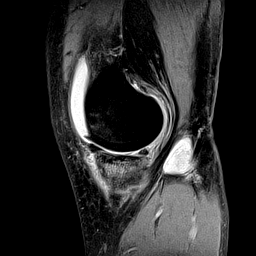
[im 28/28]
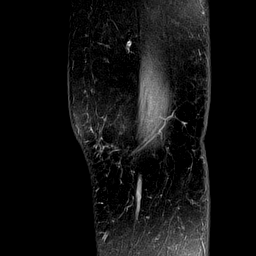

[Series 12: PD fat-sat · coronal · 4.0mm · 0.62mm/px · 6 of 27 slices shown (2 of 3)]
[im 1/27]
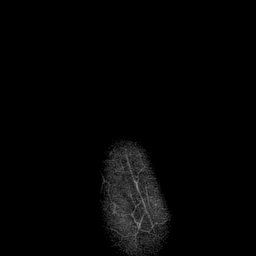
[im 6/27]
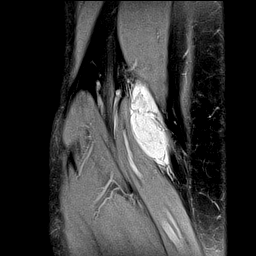
[im 11/27]
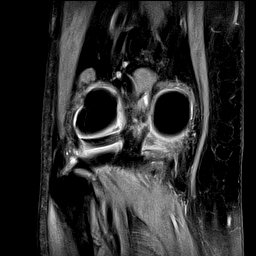
[im 16/27]
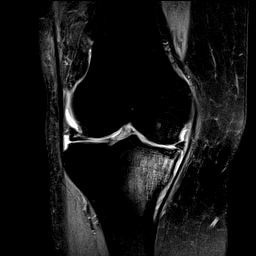
[im 21/27]
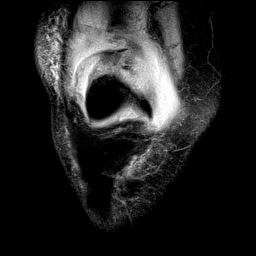
[im 27/27]
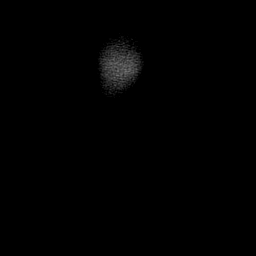

[Series 13: PD fat-sat · oblique · 2.0mm · 0.62mm/px · 4 of 19 slices shown (3 of 3)]
[im 1/19]
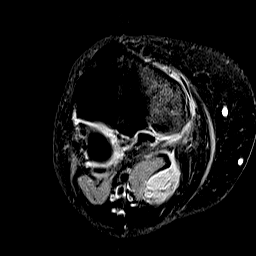
[im 7/19]
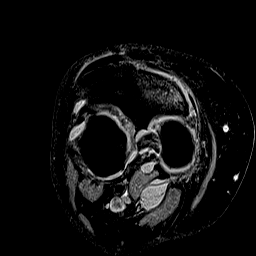
[im 13/19]
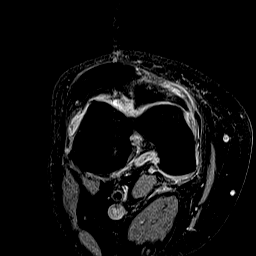
[im 19/19]
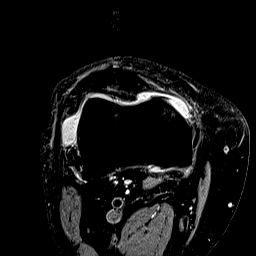

[40 of 40 positions shown; findings below may reference images not displayed]

FINDINGS: MENISCI

Medial meniscus: Large radial tear of the posterior horn with
phantom meniscus sign on image 20 series 11, with adjacent grade 3
oblique signal extending to the inferior surface of the posterior
horn.

Lateral meniscus: Vertical and degenerative tearing of the anterior
horn lateral meniscus extending into the meniscal root. There is
also a component of grade 3 oblique tearing in the anterior horn
involving the superior surface, image 16 series 12.

LIGAMENTS

Cruciates: Mildly accentuated signal in the ACL favoring
degeneration, without overt ACL tear. PCL intact.

Collaterals:  Unremarkable

CARTILAGE

Patellofemoral: Prominent chondral thinning along portions of the
patellofemoral joint with marginal spurring. Irregularity of the
remaining cartilage along the patellar facets.

Medial: Prominent degenerative chondral thinning. 1.3 by 1.9 cm
osteochondral lesion or subcortical stress fracture along the medial
tibial plateau with considerable surrounding marrow edema. Low-level
subcortical marrow edema anteromedially in the medial femoral
condyle.

Lateral: Moderate degenerative chondral thinning and chondral
irregularity. Marginal spurring.

Joint: Moderate knee effusion. Questionable small free osteochondral
fragment versus synovitis posteriorly along the intercondylar notch
region.

Popliteal Fossa:  Moderate size complex Baker's cyst.

Extensor Mechanism: Lateral patellar tilt and slight lateral
patellar subluxation. Fatty atrophy of the distal vastus lateralis
and intermedius muscles laterally as shown on coronal T1 weighted
image 17 of series 8.

Bones: No significant extra-articular osseous abnormalities
identified.

Other: No supplemental non-categorized findings.
IMPRESSION: 1. Large radial tear of the posterior horn medial meniscus with
adjacent grade 3 oblique signal extending to the inferior surface of
the posterior horn.
2. Vertical and degenerative tearing of the anterior horn lateral
meniscus extending into the meniscal root. There is also a component
of grade 3 oblique tearing in the anterior horn involving the
superior surface.
3. 1.3 by 1.9 cm osteochondral lesion or subcortical stress fracture
along the medial tibial plateau with considerable surrounding marrow
edema.
4. Prominent degenerative chondral thinning in the patellofemoral
joint and medial compartment with moderate chondral thinning in the
lateral compartment.
5. Moderate knee effusion with moderate size complex Baker's cyst.
Questionable small free osteochondral fragment versus synovitis
posteriorly along the intercondylar notch region.
6. Lateral patellar tilt and slight lateral patellar subluxation.
Fatty atrophy of the distal vastus lateralis and intermedius muscles
laterally.

## 2020-09-10 ENCOUNTER — Other Ambulatory Visit: Payer: Self-pay | Admitting: Medical-Surgical

## 2020-09-10 DIAGNOSIS — R928 Other abnormal and inconclusive findings on diagnostic imaging of breast: Secondary | ICD-10-CM

## 2020-09-11 NOTE — Addendum Note (Signed)
Addended by: Monica Becton on: 09/11/2020 04:54 PM   Modules accepted: Orders

## 2020-09-16 ENCOUNTER — Other Ambulatory Visit: Payer: Self-pay

## 2020-09-16 ENCOUNTER — Ambulatory Visit (AMBULATORY_SURGERY_CENTER): Payer: Self-pay | Admitting: *Deleted

## 2020-09-16 VITALS — Ht 66.0 in | Wt 215.0 lb

## 2020-09-16 DIAGNOSIS — Z1211 Encounter for screening for malignant neoplasm of colon: Secondary | ICD-10-CM

## 2020-09-16 NOTE — Progress Notes (Signed)

## 2020-09-17 ENCOUNTER — Encounter: Payer: Self-pay | Admitting: Gastroenterology

## 2020-09-18 ENCOUNTER — Ambulatory Visit: Payer: 59 | Admitting: Rehabilitative and Restorative Service Providers"

## 2020-09-24 ENCOUNTER — Ambulatory Visit
Admission: RE | Admit: 2020-09-24 | Discharge: 2020-09-24 | Disposition: A | Discharge status: Home / Self Care | Payer: 59 | Source: Ambulatory Visit | Attending: Medical-Surgical | Admitting: Medical-Surgical

## 2020-09-24 ENCOUNTER — Other Ambulatory Visit: Payer: Self-pay

## 2020-09-24 ENCOUNTER — Other Ambulatory Visit: Payer: Self-pay | Admitting: Medical-Surgical

## 2020-09-24 DIAGNOSIS — N63 Unspecified lump in unspecified breast: Secondary | ICD-10-CM

## 2020-09-24 DIAGNOSIS — R928 Other abnormal and inconclusive findings on diagnostic imaging of breast: Secondary | ICD-10-CM

## 2020-09-24 DIAGNOSIS — Z1231 Encounter for screening mammogram for malignant neoplasm of breast: Secondary | ICD-10-CM

## 2020-09-24 DIAGNOSIS — N6489 Other specified disorders of breast: Secondary | ICD-10-CM

## 2020-09-24 IMAGING — US US BREAST*R* LIMITED INC AXILLA
1 series · 11 of 11 positions shown · non-contrast
Comparison: None.

CLINICAL DATA: The patient was called back for a right breast mass
and a left breast asymmetry.

EXAM:
DIGITAL DIAGNOSTIC BILATERAL MAMMOGRAM WITH TOMO
ULTRASOUND BILATERAL BREAST

[Series 1: us breast*right* limited inc axilla · 0.07mm/px · 11 of 11 slices shown]
[im 1/11]
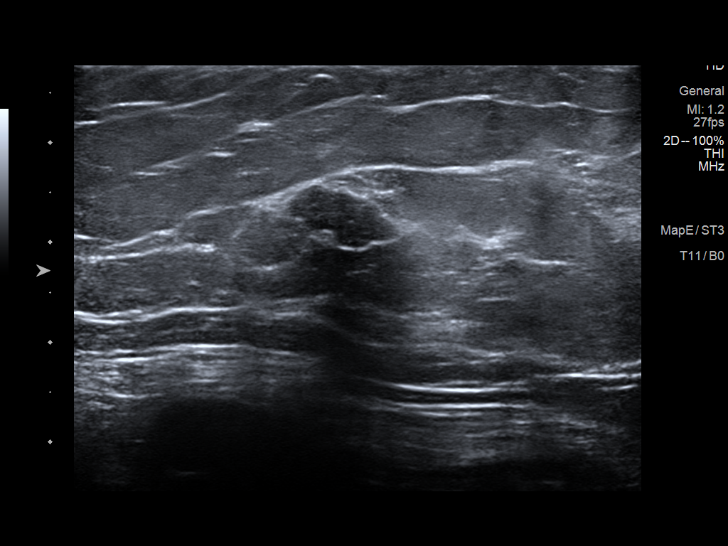
[im 2/11]
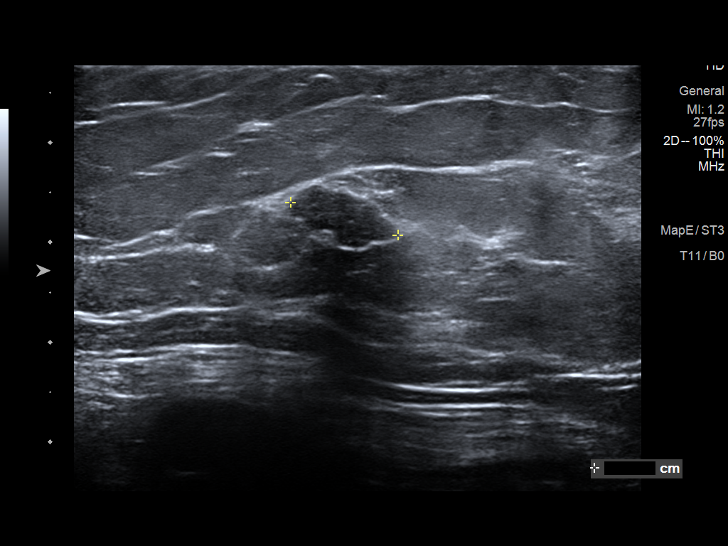
[im 3/11]
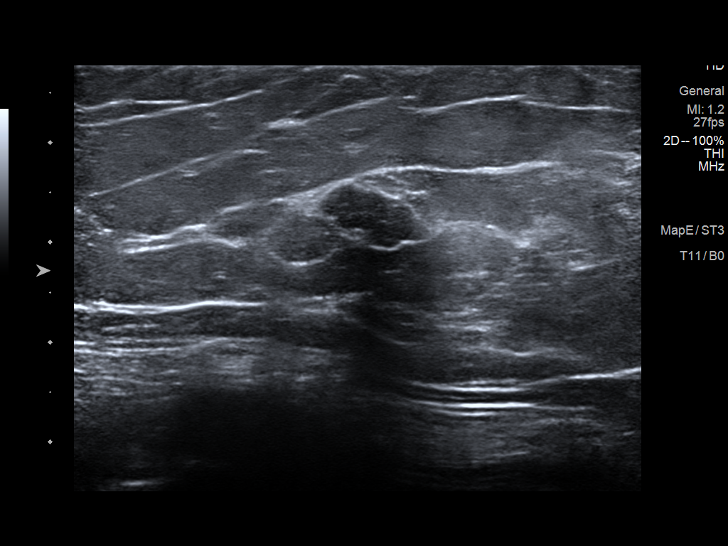
[im 4/11]
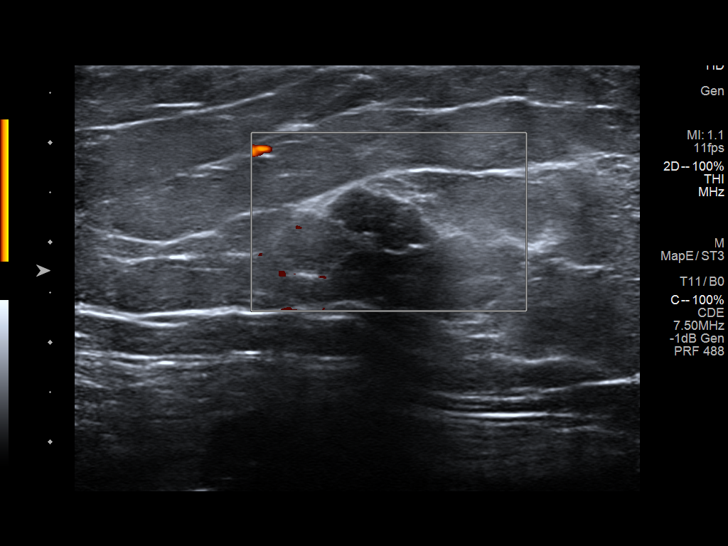
[im 5/11]
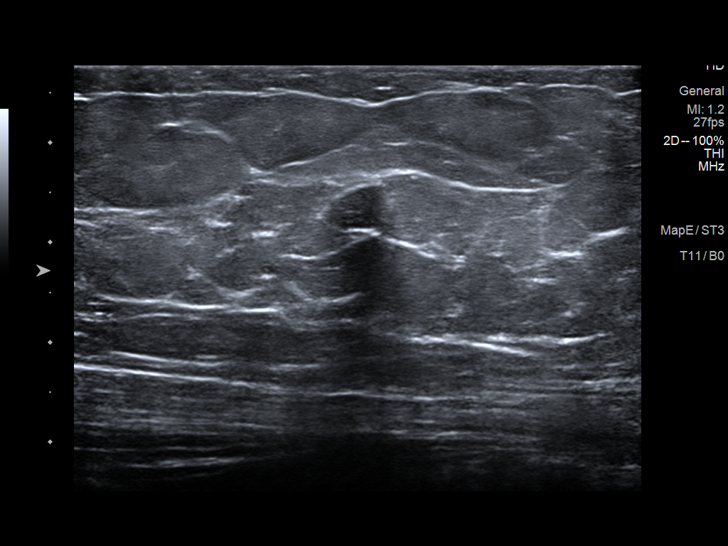
[im 6/11]
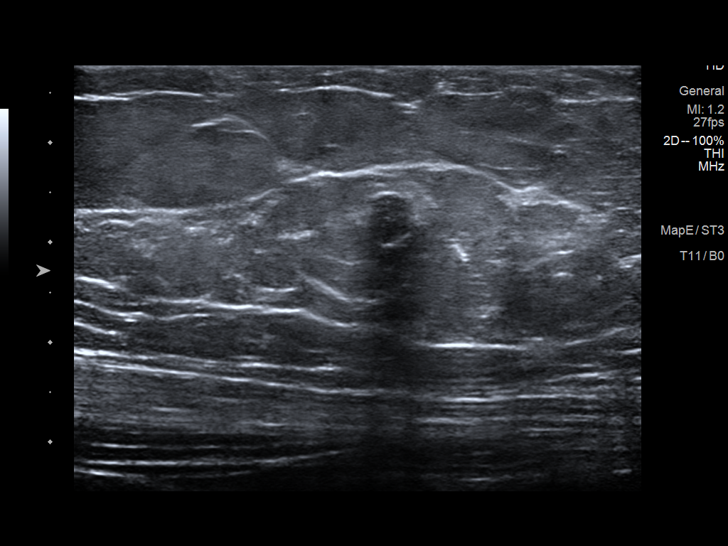
[im 7/11]
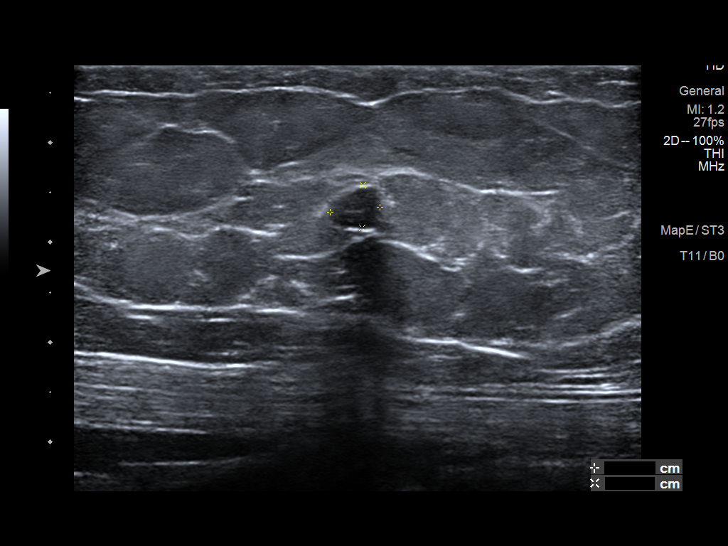
[im 8/11]
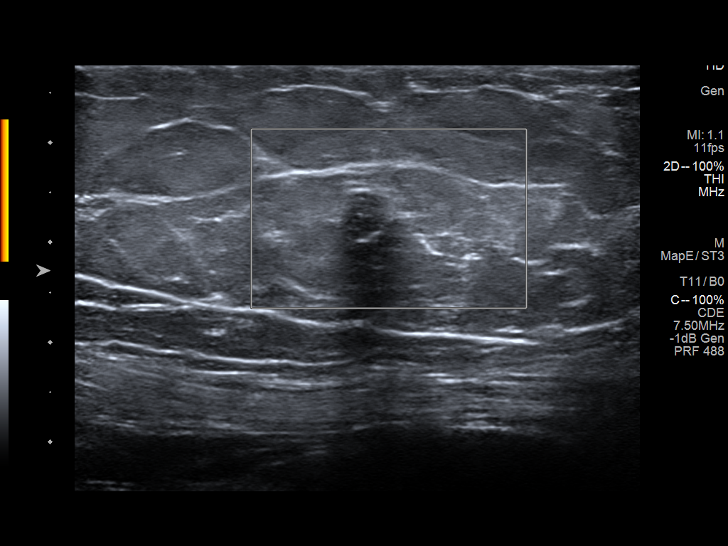
[im 9/11]
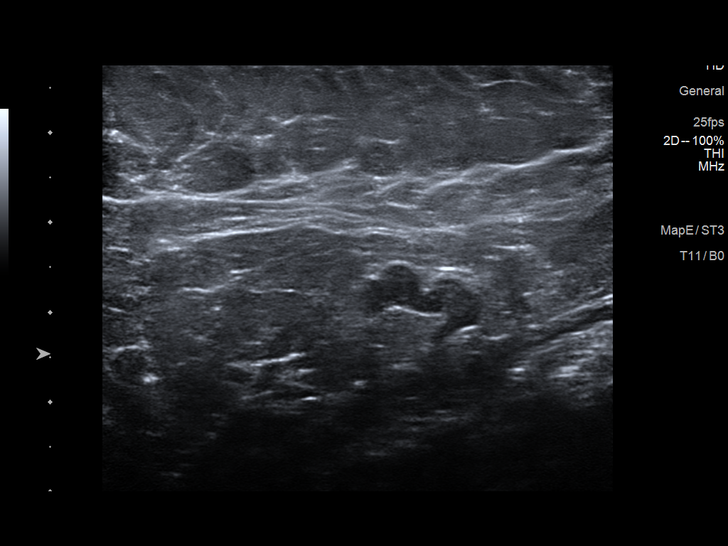
[im 10/11]
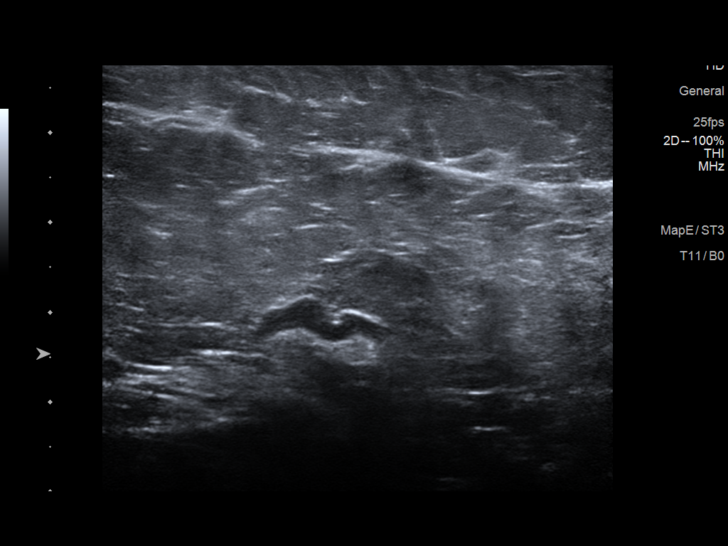
[im 11/11]
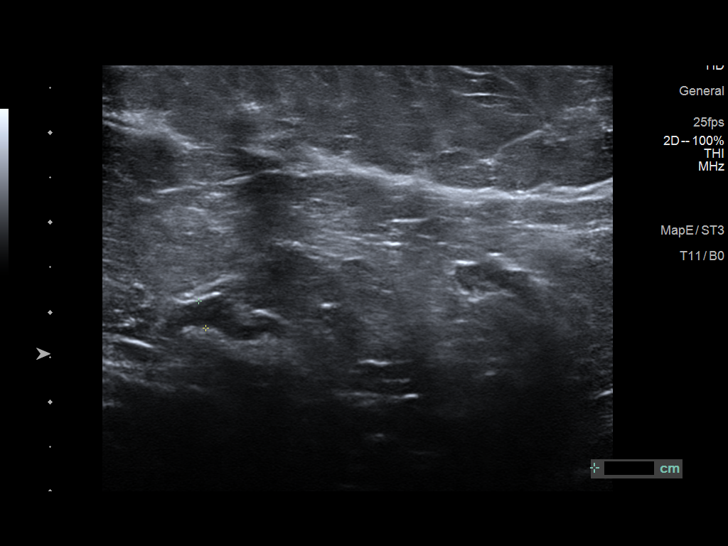

[11 of 11 positions shown; findings below may reference images not displayed]

ACR Breast Density Category b: There are scattered areas of
fibroglandular density.
FINDINGS: The mass in the superomedial right breast at a posterior depth
persists on additional imaging.

The asymmetry behind the nipple on the cc view only appears to
represent a mass based on 3D imaging measuring 6 or 7 mm.

No other suspicious findings in either breast.

On physical exam, no suspicious lumps are identified.

Targeted ultrasound is performed, showing a probably benign mass in
the left breast at 12 o'clock, 5 cm from the nipple measuring 7 x 6
x 7 mm, likely either a fibroadenoma or complicated cyst. It is
unclear whether this correlates with the mammographic finding.

On the right, there is a probably benign mass at 1 o'clock, 10 cm
from the nipple measuring 11 by 5 x 4 mm, correlating with the
mammographically identified mass.
IMPRESSION: Probably benign masses bilaterally.

RECOMMENDATION:
Recommend six-month follow-up ultrasound of the probably benign
right breast mass. Recommend six-month follow-up mammogram and
ultrasound of the probably benign left breast mass.

I have discussed the findings and recommendations with the patient.
If applicable, a reminder letter will be sent to the patient
regarding the next appointment.

BI-RADS CATEGORY  3: Probably benign.

## 2020-09-24 IMAGING — US US BREAST*L* LIMITED INC AXILLA
1 series · 9 of 9 positions shown · non-contrast
Comparison: None.

CLINICAL DATA: The patient was called back for a right breast mass
and a left breast asymmetry.

EXAM:
DIGITAL DIAGNOSTIC BILATERAL MAMMOGRAM WITH TOMO
ULTRASOUND BILATERAL BREAST

[Series 1: us breast*left* limited inc axilla · 0.08mm/px · 9 of 9 slices shown]
[im 1/9]
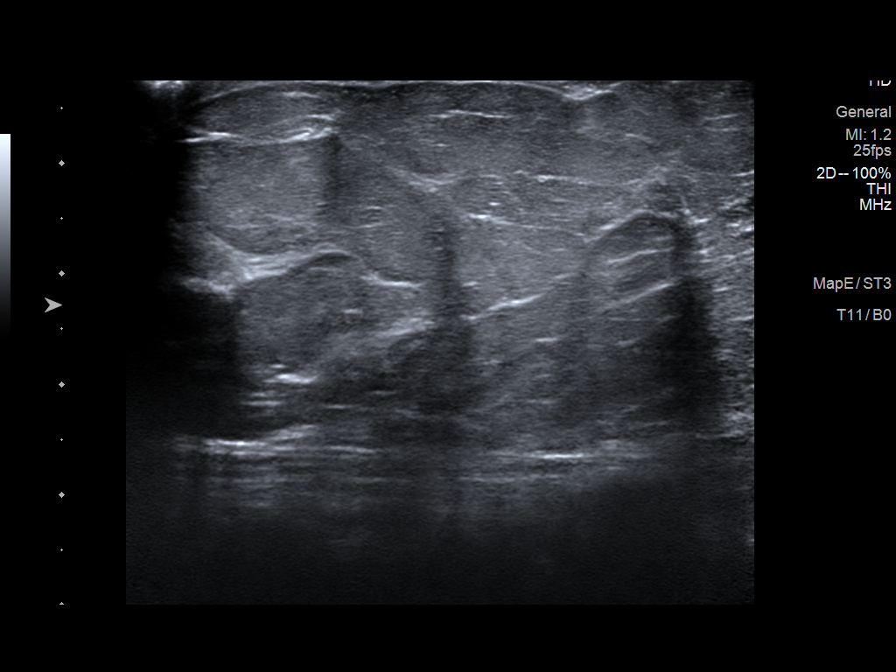
[im 2/9]
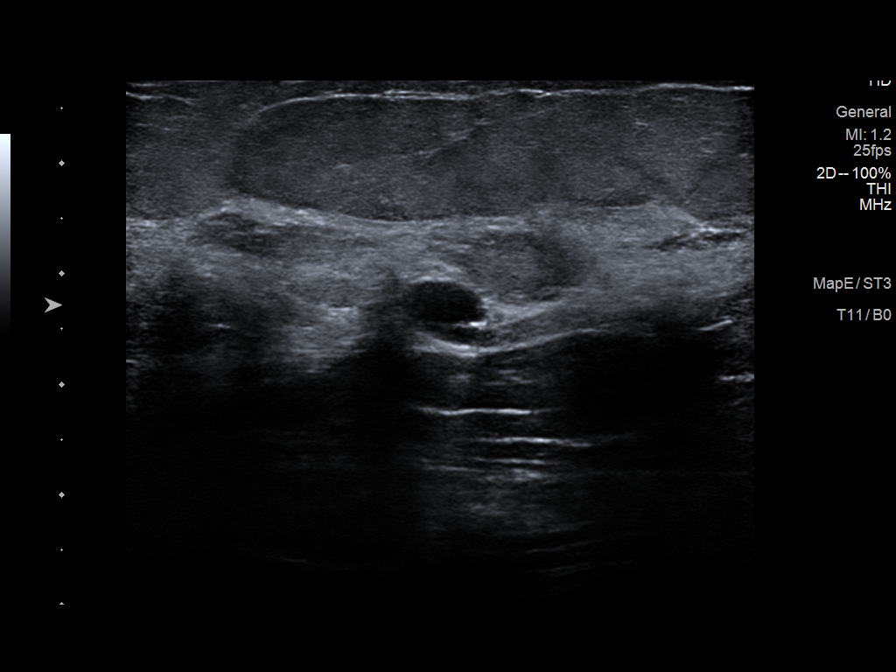
[im 3/9]
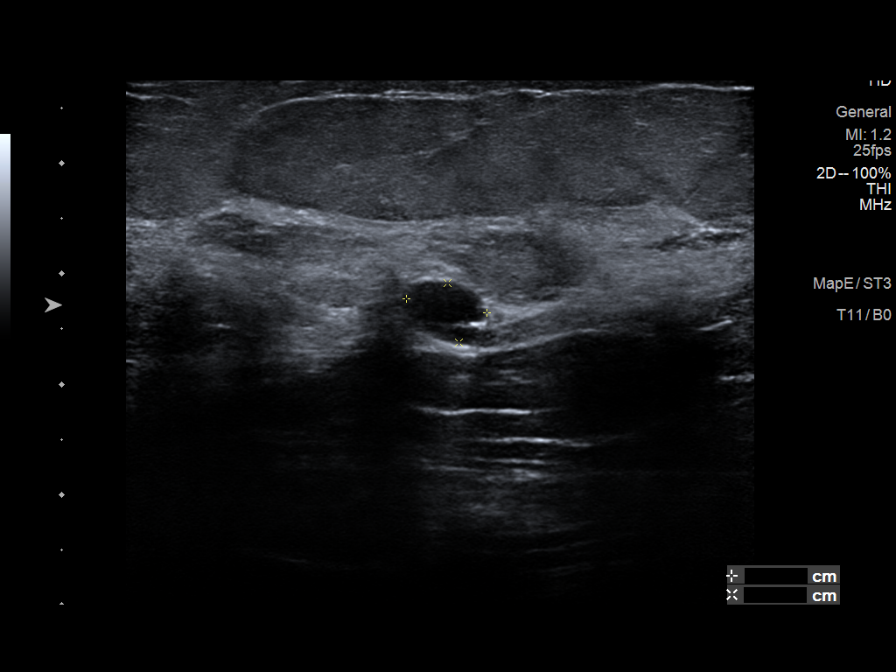
[im 4/9]
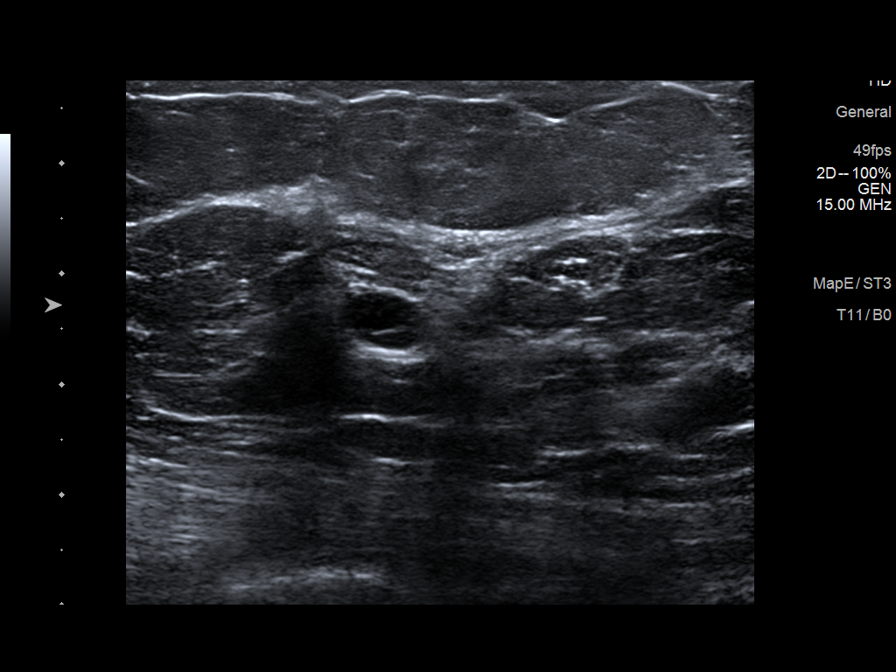
[im 5/9]
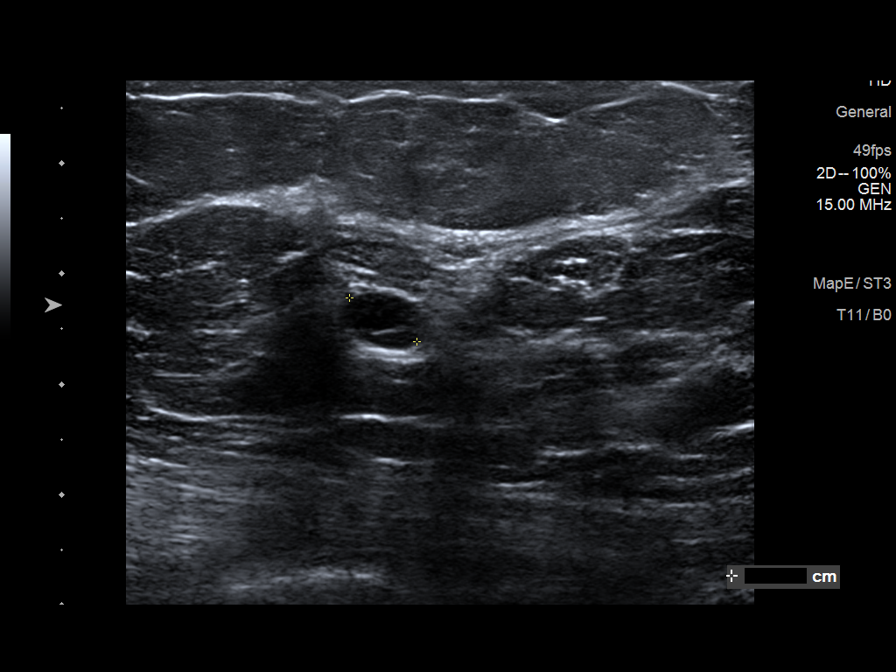
[im 6/9]
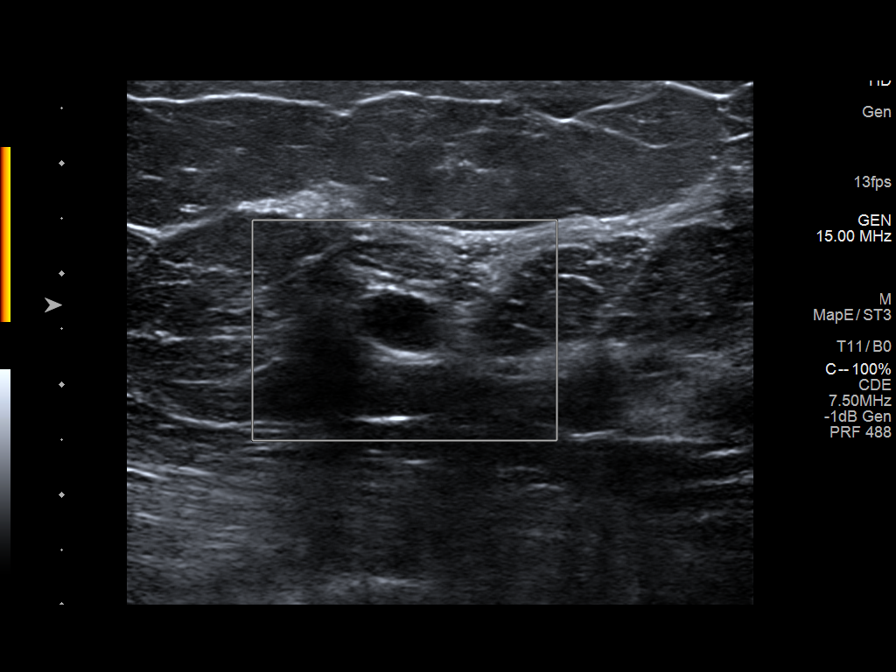
[im 7/9]
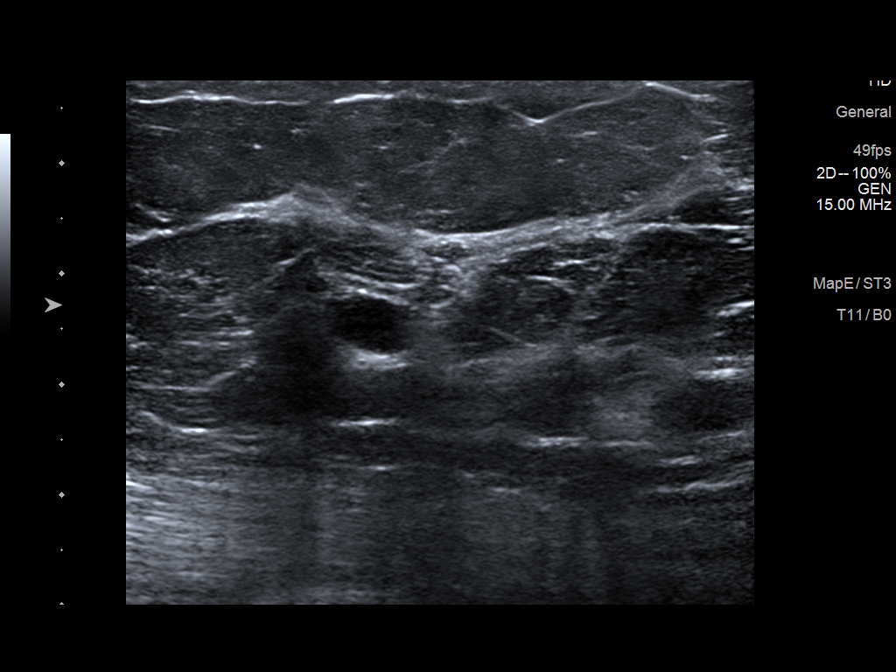
[im 8/9]
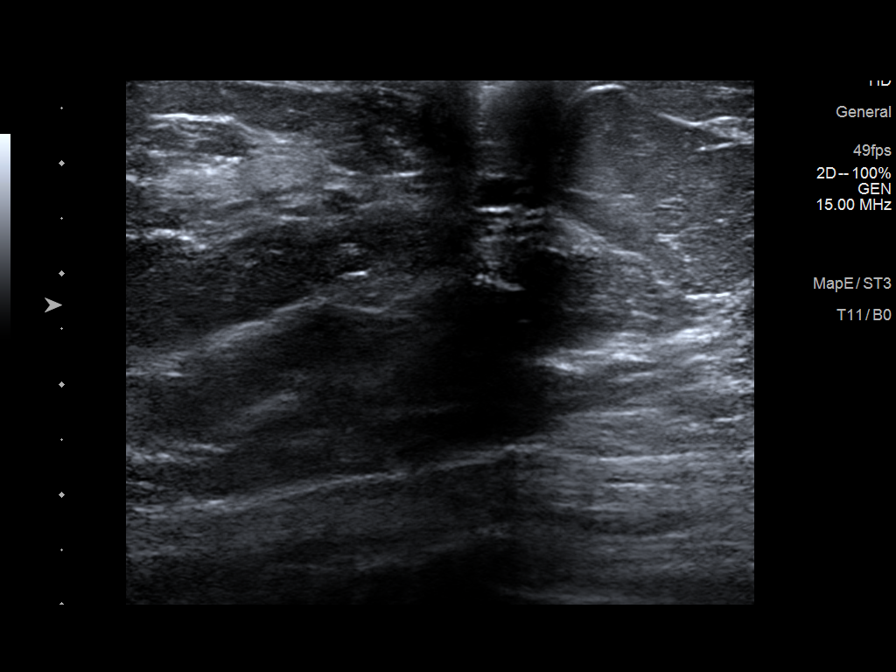
[im 9/9]
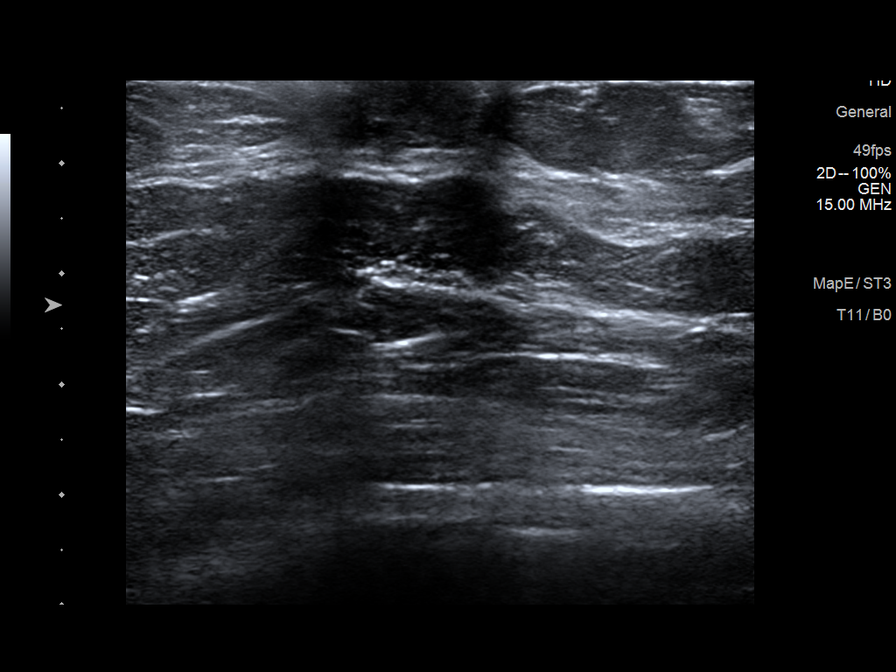

[9 of 9 positions shown; findings below may reference images not displayed]

ACR Breast Density Category b: There are scattered areas of
fibroglandular density.
FINDINGS: The mass in the superomedial right breast at a posterior depth
persists on additional imaging.

The asymmetry behind the nipple on the cc view only appears to
represent a mass based on 3D imaging measuring 6 or 7 mm.

No other suspicious findings in either breast.

On physical exam, no suspicious lumps are identified.

Targeted ultrasound is performed, showing a probably benign mass in
the left breast at 12 o'clock, 5 cm from the nipple measuring 7 x 6
x 7 mm, likely either a fibroadenoma or complicated cyst. It is
unclear whether this correlates with the mammographic finding.

On the right, there is a probably benign mass at 1 o'clock, 10 cm
from the nipple measuring 11 by 5 x 4 mm, correlating with the
mammographically identified mass.
IMPRESSION: Probably benign masses bilaterally.

RECOMMENDATION:
Recommend six-month follow-up ultrasound of the probably benign
right breast mass. Recommend six-month follow-up mammogram and
ultrasound of the probably benign left breast mass.

I have discussed the findings and recommendations with the patient.
If applicable, a reminder letter will be sent to the patient
regarding the next appointment.

BI-RADS CATEGORY  3: Probably benign.

## 2020-09-24 IMAGING — MG DIGITAL DIAGNOSTIC BILAT W/ TOMO W/ CAD
6 of 10 series · 6 of 30 positions shown · non-contrast
Comparison: None.

CLINICAL DATA: The patient was called back for a right breast mass
and a left breast asymmetry.

EXAM:
DIGITAL DIAGNOSTIC BILATERAL MAMMOGRAM WITH TOMO
ULTRASOUND BILATERAL BREAST

[L CC synth-2D (1 of 2)]
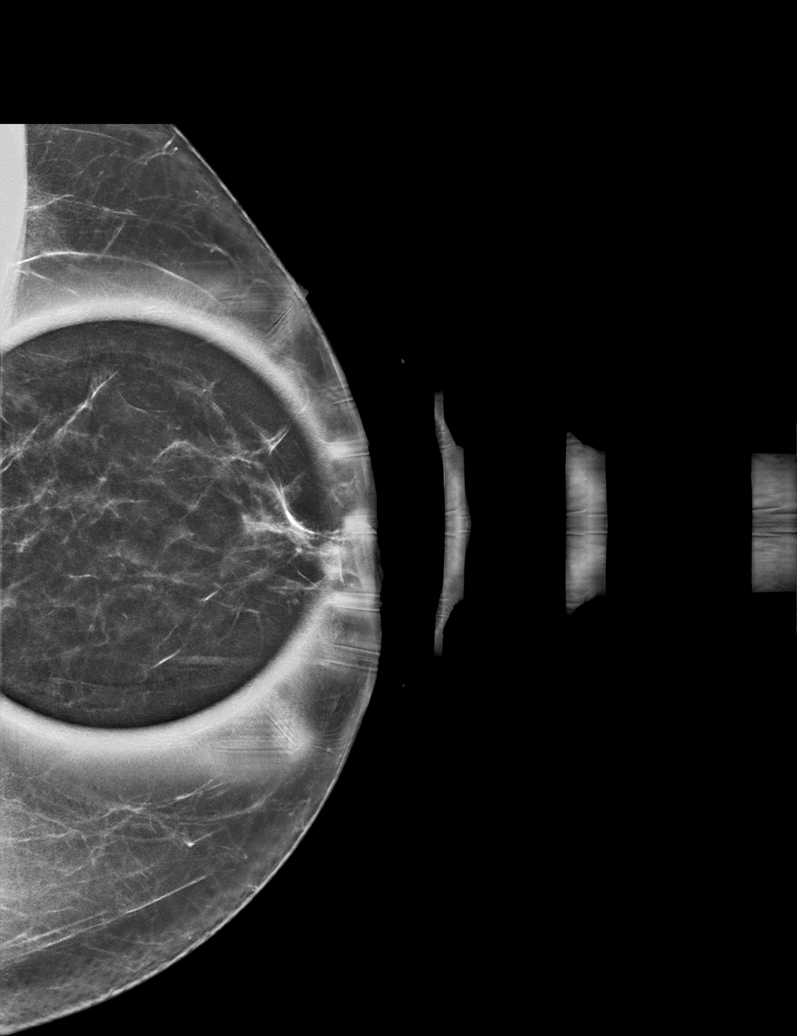

[L CC synth-2D (2 of 2)]
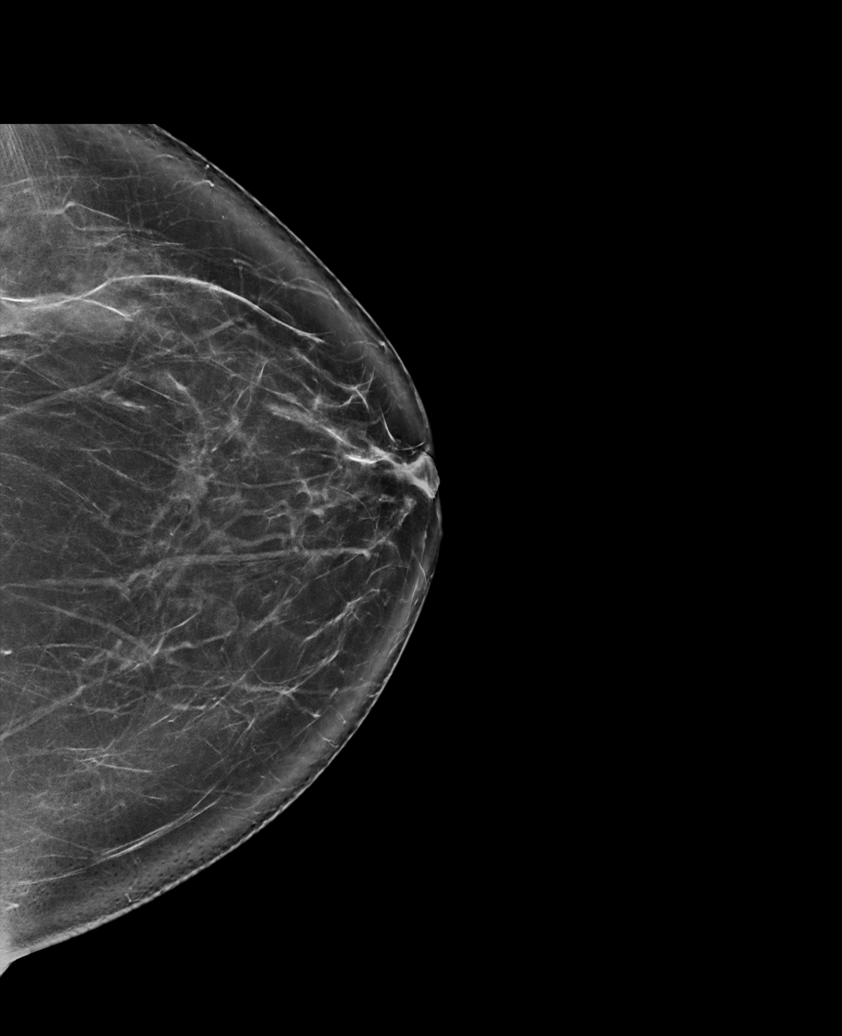

[R MLO synth-2D]
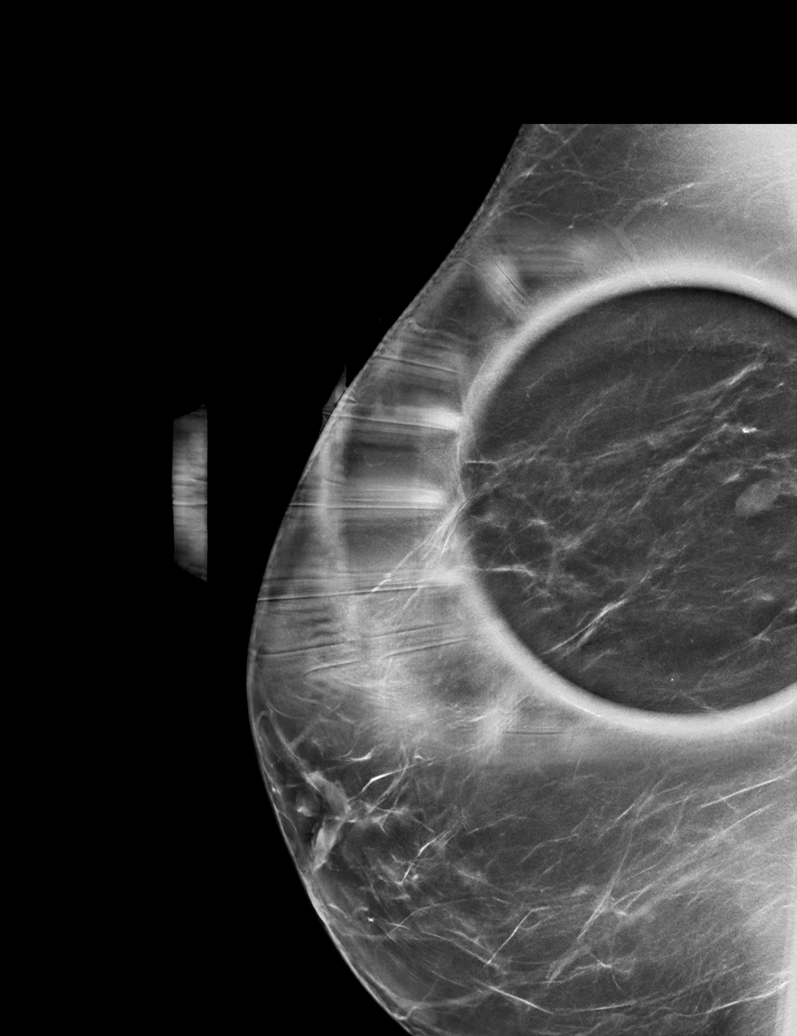

[R CC synth-2D]
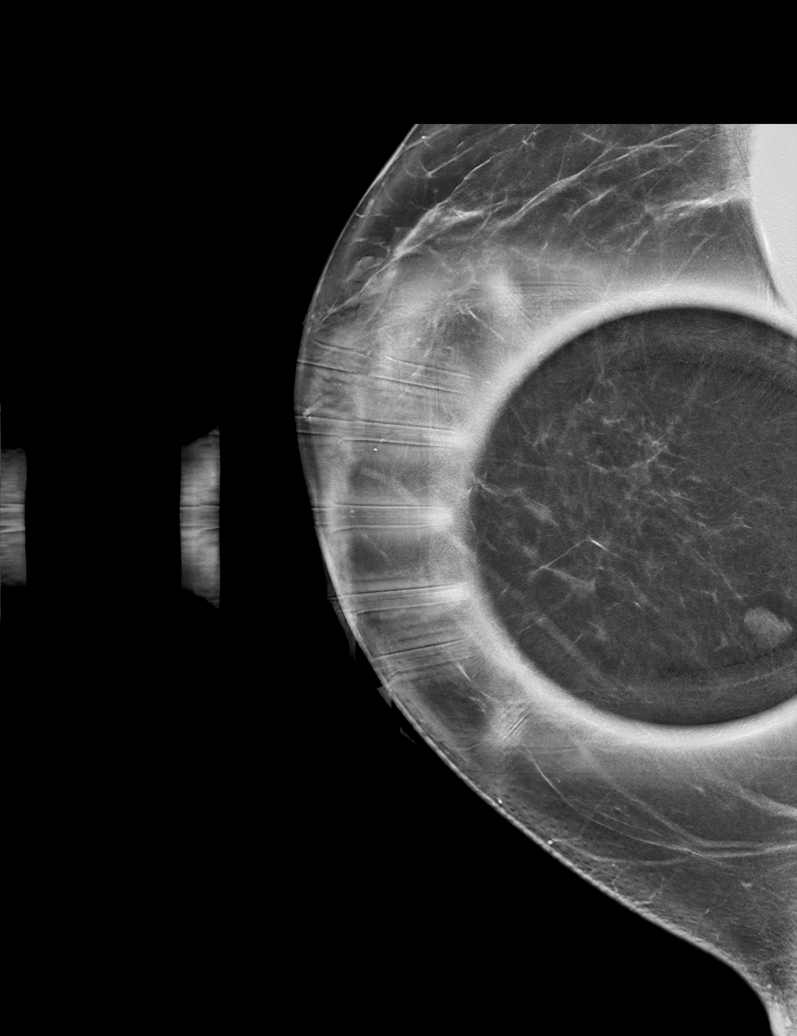

[L ML synth-2D]
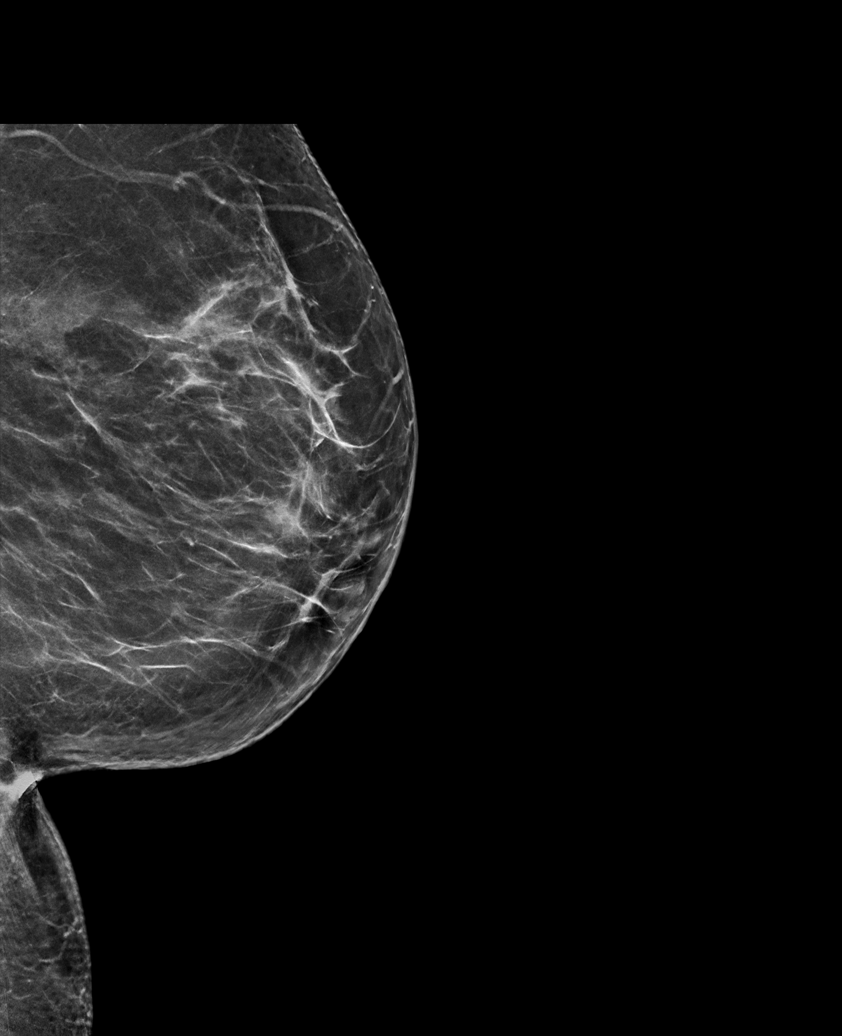

[R CC tomo · tomo slice 39/77.0]
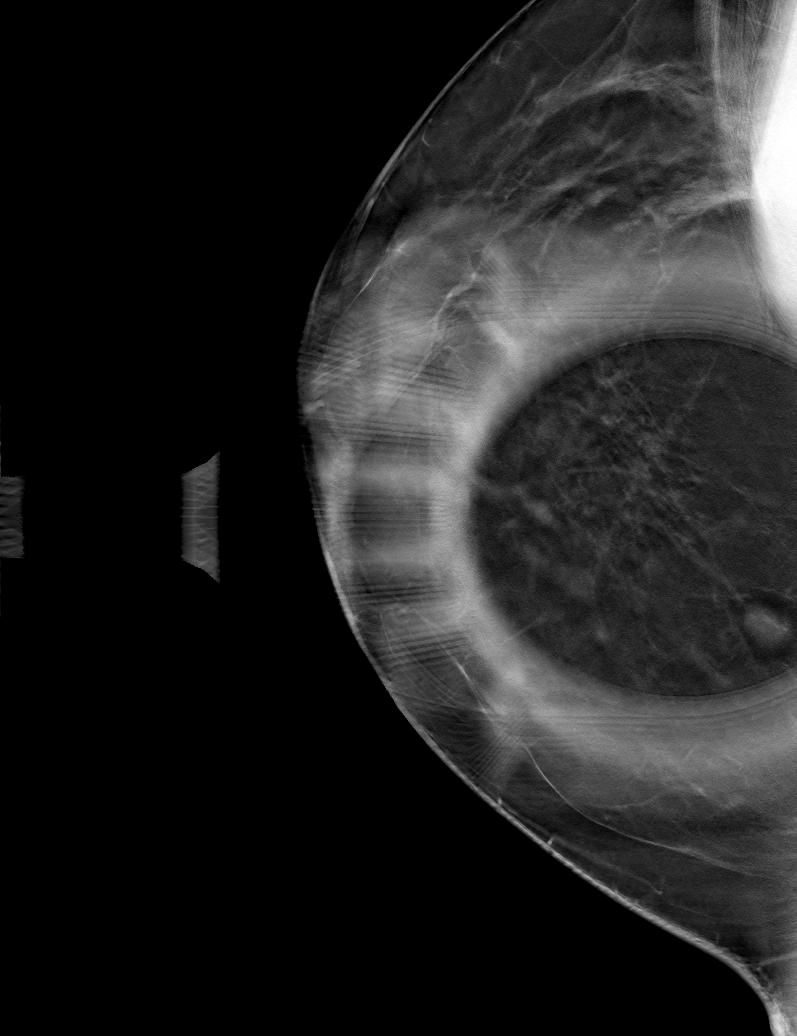

[6 of 30 positions shown; findings below may reference images not displayed]

ACR Breast Density Category b: There are scattered areas of
fibroglandular density.
FINDINGS: The mass in the superomedial right breast at a posterior depth
persists on additional imaging.

The asymmetry behind the nipple on the cc view only appears to
represent a mass based on 3D imaging measuring 6 or 7 mm.

No other suspicious findings in either breast.

On physical exam, no suspicious lumps are identified.

Targeted ultrasound is performed, showing a probably benign mass in
the left breast at 12 o'clock, 5 cm from the nipple measuring 7 x 6
x 7 mm, likely either a fibroadenoma or complicated cyst. It is
unclear whether this correlates with the mammographic finding.

On the right, there is a probably benign mass at 1 o'clock, 10 cm
from the nipple measuring 11 by 5 x 4 mm, correlating with the
mammographically identified mass.
IMPRESSION: Probably benign masses bilaterally.

RECOMMENDATION:
Recommend six-month follow-up ultrasound of the probably benign
right breast mass. Recommend six-month follow-up mammogram and
ultrasound of the probably benign left breast mass.

I have discussed the findings and recommendations with the patient.
If applicable, a reminder letter will be sent to the patient
regarding the next appointment.

BI-RADS CATEGORY  3: Probably benign.

## 2020-09-30 NOTE — Progress Notes (Signed)
Subjective:    CC: follow up  HPI: Pleasant 54 year old female presenting for follow up after starting cholesterol medication 6 weeks ago. She started atorvastatin 20mg  daily and has been tolerating it well. No worsening joint or muscle aches but notes she has had some intermittent calf cramps. No changes in urination.   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 Is worried about getting her Methadone after surgery since she will not be able to drive or go to the clinic daily to get her dose. Reports she will be stopping the Meloxicam this week as instructed by her surgeon.   She has surgery coming up next week and is happy to get something done to her right knee. She is still working and has trouble with pain but makes it through each day. Knows that she will have to be out for approximately 6 weeks and will have to do physical therapy. Is looking forward to having less knee pain.   I reviewed the past medical history, family history, social history, surgical history, and allergies today and no changes were needed.  Please see the problem list section below in epic for further details.  Past Medical History: Past Medical History:  Diagnosis Date  . Anxiety   . Arthritis   . Asthma   . Depression   . Hypertension    Past Surgical History: Past Surgical History:  Procedure Laterality Date  . APPENDECTOMY    . CHOLECYSTECTOMY    . TUBAL LIGATION     Social History: Social History   Socioeconomic History  . Marital status: Single    Spouse name: Not on file  . Number of children: Not on file  . Years of education: Not on file  . Highest education level: Not on file  Occupational History  . Not on file  Tobacco Use  . Smoking status: Current Every Day Smoker    Packs/day: 1.00    Years: 37.00    Pack years: 37.00    Types: Cigarettes  . Smokeless tobacco: Never Used  Vaping Use  . Vaping Use: Never used  Substance and Sexual Activity  .  Alcohol use: Not Currently  . Drug use: Never  . Sexual activity: Yes    Partners: Male    Birth control/protection: Post-menopausal  Other Topics Concern  . Not on file  Social History Narrative  . Not on file   Social Determinants of Health   Financial Resource Strain:   . Difficulty of Paying Living Expenses: Not on file  Food Insecurity:   . Worried About in the Last Year: Not on file  . Ran Out of Food in the Last Year: Not on file  Transportation Needs:   . Lack of Transportation (Medical): Not on file  . Lack of Transportation (Non-Medical): Not on file  Physical Activity:   . Days of Exercise per Week: Not on file  . Minutes of Exercise per Session: Not on file  Stress:   . Feeling of Stress : Not on file  Social Connections:   . Frequency of Communication with Friends and Family: Not on file  . Frequency of Social Gatherings with Friends and Family: Not on file  . Attends Religious Services: Not on file  . Active Member of Clubs or Organizations: Not on file  . Attends Programme researcher, broadcasting/film/video Meetings: Not on file  . Marital Status: Not on file   Family History: Family History  Problem Relation Age of Onset  . Hypertension Mother   . Heart attack Mother   . Diabetes Mother   . Hypertension Sister   . Heart attack Sister   . Diabetes Sister   . Hypertension Brother   . Heart attack Brother   . Diabetes Brother   . Colitis Neg Hx   . Esophageal cancer Neg Hx   . Stomach cancer Neg Hx   . Rectal cancer Neg Hx    Allergies: No Known Allergies Medications: See med rec.  Review of Systems: See HPI for pertinent positives and negatives.   Objective:    General: Well Developed, well nourished, and in no acute distress.  Neuro: Alert and oriented x3.  HEENT: Normocephalic, atraumatic.  Skin: Warm and dry. Cardiac: Regular rate and rhythm, no murmurs rubs or gallops, no lower extremity edema.  Respiratory: Clear to auscultation  bilaterally. Not using accessory muscles, speaking in full sentences. MSK: Gait limited by right knee pain.  Impression and Recommendations:    Essential hypertension At goal today. Continue Lisinopril 20mg  daily.  Hyperlipidemia, mixed Checking CMP and lipid panel today. Continue Atorvastatin 20mg  daily.  Return in about 3 months (around 01/01/2021) for HTN, anxiety/depression follow up; schedule pap smear at your convenience.  ___________________________________________ , DNP, APRN, FNP-BC Primary Care and Sports Medicine [facility] [place]

## 2020-10-01 ENCOUNTER — Ambulatory Visit (INDEPENDENT_AMBULATORY_CARE_PROVIDER_SITE_OTHER): Payer: 59 | Admitting: Sports Medicine

## 2020-10-01 ENCOUNTER — Encounter: Payer: Self-pay | Admitting: Medical-Surgical

## 2020-10-01 ENCOUNTER — Other Ambulatory Visit: Payer: Self-pay

## 2020-10-01 ENCOUNTER — Ambulatory Visit (INDEPENDENT_AMBULATORY_CARE_PROVIDER_SITE_OTHER): Payer: 59 | Admitting: Medical-Surgical

## 2020-10-01 VITALS — BP 131/86 | HR 69 | Temp 98.4°F | Ht 64.5 in | Wt 217.5 lb

## 2020-10-01 DIAGNOSIS — M25561 Pain in right knee: Secondary | ICD-10-CM | POA: Diagnosis not present

## 2020-10-01 DIAGNOSIS — E782 Mixed hyperlipidemia: Secondary | ICD-10-CM

## 2020-10-01 DIAGNOSIS — G8929 Other chronic pain: Secondary | ICD-10-CM | POA: Diagnosis not present

## 2020-10-01 DIAGNOSIS — I10 Essential (primary) hypertension: Secondary | ICD-10-CM

## 2020-10-01 MED ORDER — METHADONE HCL 10 MG/5ML PO SOLN: 80.0000 mg | Freq: Every day | ORAL | Status: AC

## 2020-10-01 NOTE — Assessment & Plan Note (Signed)
Checking CMP and lipid panel today. Continue Atorvastatin 20mg  daily.

## 2020-10-01 NOTE — Assessment & Plan Note (Signed)
At goal today. Continue Lisinopril 20mg  daily.

## 2020-10-01 NOTE — Progress Notes (Signed)
    Procedures performed today:    None.  Independent interpretation of notes and tests performed by another provider:   MRI personally reviewed and shows severe degenerative meniscal tearing, osteoarthritis, and a medial tibial plateau subchondral insufficiency fracture.  Brief History, Exam, Impression, and Recommendations:    Right knee pain Jasmine Mejia returns, ultimately her MRI showed osteoarthritis, severe meniscal tearing as well as a subchondral insufficiency fracture of the medial tibial plateau. She did see Dr. Everardo Pacific who is planning medial tibial subchondral cement as well as arthroscopic debridement She does have an unloader brace which is providing some relief. We discussed the anatomy and some simple aspects of the procedure. I think she can return to see me on an as-needed basis.    ___________________________________________ Jasmine Mejia. Jasmine Mejia, M.D., ABFM., CAQSM. Primary Care and Sports Medicine [facility]  Adjunct Instructor of Family Medicine  [employer]

## 2020-10-01 NOTE — Assessment & Plan Note (Signed)
Jasmine Mejia returns, ultimately her MRI showed osteoarthritis, severe meniscal tearing as well as a subchondral insufficiency fracture of the medial tibial plateau. She did see Dr. Everardo Pacific who is planning medial tibial subchondral cement as well as arthroscopic debridement She does have an unloader brace which is providing some relief. We discussed the anatomy and some simple aspects of the procedure. I think she can return to see me on an as-needed basis.

## 2020-10-02 ENCOUNTER — Encounter (HOSPITAL_BASED_OUTPATIENT_CLINIC_OR_DEPARTMENT_OTHER): Payer: Self-pay | Admitting: Orthopaedic Surgery

## 2020-10-02 ENCOUNTER — Other Ambulatory Visit: Payer: Self-pay

## 2020-10-02 LAB — COMPLETE METABOLIC PANEL WITH GFR
AG Ratio: 1.3 (calc) (ref 1.0–2.5)
ALT: 24 U/L (ref 6–29)
AST: 20 U/L (ref 10–35)
Albumin: 3.8 g/dL (ref 3.6–5.1)
Alkaline phosphatase (APISO): 67 U/L (ref 37–153)
BUN: 17 mg/dL (ref 7–25)
CO2: 27 mmol/L (ref 20–32)
Calcium: 9.5 mg/dL (ref 8.6–10.4)
Chloride: 107 mmol/L (ref 98–110)
Creat: 0.96 mg/dL (ref 0.50–1.05)
GFR, Est African American: 79 mL/min/{1.73_m2} (ref >=60)
GFR, Est Non African American: 68 mL/min/{1.73_m2} (ref >=60)
Globulin: 2.9 g/dL (calc) (ref 1.9–3.7)
Glucose, Bld: 86 mg/dL (ref 65–99)
Potassium: 4 mmol/L (ref 3.5–5.3)
Sodium: 142 mmol/L (ref 135–146)
Total Bilirubin: 0.3 mg/dL (ref 0.2–1.2)
Total Protein: 6.7 g/dL (ref 6.1–8.1)

## 2020-10-02 LAB — LIPID PANEL
Cholesterol: 186 mg/dL (ref <200)
HDL: 48 mg/dL — ABNORMAL LOW (ref >=50)
LDL Cholesterol (Calc): 108 mg/dL (calc) — ABNORMAL HIGH
Non-HDL Cholesterol (Calc): 138 mg/dL (calc) — ABNORMAL HIGH (ref <130)
Total CHOL/HDL Ratio: 3.9 (calc) (ref <5.0)
Triglycerides: 183 mg/dL — ABNORMAL HIGH (ref <150)

## 2020-10-02 MED ORDER — ATORVASTATIN CALCIUM 40 MG PO TABS: 40.0000 mg | ORAL_TABLET | Freq: Every day | ORAL | 3 refills | Status: DC

## 2020-10-02 NOTE — Addendum Note (Signed)
Addended byChristen Butter on: 10/02/2020 08:59 AM   Modules accepted: Orders

## 2020-10-04 ENCOUNTER — Encounter (HOSPITAL_BASED_OUTPATIENT_CLINIC_OR_DEPARTMENT_OTHER)
Admission: RE | Admit: 2020-10-04 | Discharge: 2020-10-04 | Disposition: A | Discharge status: Home / Self Care | Payer: 59 | Source: Ambulatory Visit | Attending: Orthopaedic Surgery | Admitting: Orthopaedic Surgery

## 2020-10-04 DIAGNOSIS — Z01810 Encounter for preprocedural cardiovascular examination: Secondary | ICD-10-CM | POA: Insufficient documentation

## 2020-10-04 DIAGNOSIS — I10 Essential (primary) hypertension: Secondary | ICD-10-CM | POA: Diagnosis not present

## 2020-10-05 ENCOUNTER — Other Ambulatory Visit (HOSPITAL_COMMUNITY)
Admission: RE | Admit: 2020-10-05 | Discharge: 2020-10-05 | Disposition: A | Discharge status: Home / Self Care | Payer: 59 | Source: Ambulatory Visit | Attending: Orthopaedic Surgery | Admitting: Orthopaedic Surgery

## 2020-10-05 DIAGNOSIS — Z01812 Encounter for preprocedural laboratory examination: Secondary | ICD-10-CM | POA: Diagnosis present

## 2020-10-05 DIAGNOSIS — Z20822 Contact with and (suspected) exposure to covid-19: Secondary | ICD-10-CM | POA: Diagnosis not present

## 2020-10-06 LAB — SARS CORONAVIRUS 2 (TAT 6-24 HRS): SARS Coronavirus 2: NEGATIVE

## 2020-10-08 NOTE — H&P (Signed)
PREOPERATIVE H&P  Chief Complaint: RIGHT TIBIA PLATEAU FRACTURE  HPI: Jasmine Mejia is a 54 y.o. female who is scheduled for, Procedure(s): OPEN REDUCTION INTERNAL FIXATION (ORIF) TIBIA FRACTURE.   Patient has a past medical history significant for HTN, hyperlipidemia, chronic pain on methadone.   Patient is a 54 year-old female who has had right knee pain for four months.  She had injections and was seen in the emergency room.  She continues to have pain on the medial side of her knee.  She has catching and locking.  After she twisted her knee her pain has been severe.    Her symptoms are rated as moderate to severe, and have been worsening.  This is significantly impairing activities of daily living.    Please see clinic note for further details on this patient's care.    She has elected for surgical management.   Past Medical History:  Diagnosis Date  . Anxiety   . Arthritis   . Asthma   . Depression   . Hypertension    Past Surgical History:  Procedure Laterality Date  . APPENDECTOMY    . CHOLECYSTECTOMY    . TUBAL LIGATION     Social History   Socioeconomic History  . Marital status: Single    Spouse name: Not on file  . Number of children: Not on file  . Years of education: Not on file  . Highest education level: Not on file  Occupational History  . Not on file  Tobacco Use  . Smoking status: Current Every Day Smoker    Packs/day: 1.00    Years: 37.00    Pack years: 37.00    Types: Cigarettes  . Smokeless tobacco: Never Used  Vaping Use  . Vaping Use: Never used  Substance and Sexual Activity  . Alcohol use: Not Currently  . Drug use: Never  . Sexual activity: Yes    Partners: Male    Birth control/protection: Post-menopausal  Other Topics Concern  . Not on file  Social History Narrative  . Not on file   Social Determinants of Health   Financial Resource Strain:   . Difficulty of Paying Living Expenses: Not on file  Food Insecurity:   .  Worried About Programme researcher, broadcasting/film/video in the Last Year: Not on file  . Ran Out of Food in the Last Year: Not on file  Transportation Needs:   . Lack of Transportation (Medical): Not on file  . Lack of Transportation (Non-Medical): Not on file  Physical Activity:   . Days of Exercise per Week: Not on file  . Minutes of Exercise per Session: Not on file  Stress:   . Feeling of Stress : Not on file  Social Connections:   . Frequency of Communication with Friends and Family: Not on file  . Frequency of Social Gatherings with Friends and Family: Not on file  . Attends Religious Services: Not on file  . Active Member of Clubs or Organizations: Not on file  . Attends Banker Meetings: Not on file  . Marital Status: Not on file   Family History  Problem Relation Age of Onset  . Hypertension Mother   . Heart attack Mother   . Diabetes Mother   . Hypertension Sister   . Heart attack Sister   . Diabetes Sister   . Hypertension Brother   . Heart attack Brother   . Diabetes Brother   . Colitis Neg Hx   .  Esophageal cancer Neg Hx   . Stomach cancer Neg Hx   . Rectal cancer Neg Hx    No Known Allergies Prior to Admission medications   Medication Sig Start Date End Date Taking? Authorizing Provider  ARIPiprazole (ABILIFY) 5 MG tablet Take 5 mg by mouth daily. 07/06/20  Yes [provider]  atorvastatin (LIPITOR) 40 MG tablet Take 1 tablet (40 mg total) by mouth daily. 10/02/20  Yes Christen Butter, NP  gabapentin (NEURONTIN) 600 MG tablet Take 600 mg by mouth 3 (three) times daily. 08/05/20  Yes [provider]  lisinopril (ZESTRIL) 20 MG tablet Take 1 tablet (20 mg total) by mouth daily. 08/20/20  Yes Christen Butter, NP  meloxicam (MOBIC) 15 MG tablet One tab PO qAM with a meal for 2 weeks, then daily prn pain. 09/03/20  Yes Monica Becton, MD  methadone (DOLOPHINE) 10 MG/5ML solution Take 40 mLs (80 mg total) by mouth daily. 10/01/20  Yes Christen Butter, NP    mirtazapine (REMERON) 30 MG tablet Take 30 mg by mouth at bedtime. 08/05/20  Yes [provider]  Multiple Vitamin (MULTI-VITAMIN) tablet Take 1 tablet by mouth daily.   Yes [provider]  sertraline (ZOLOFT) 100 MG tablet Take 100 mg by mouth 2 (two) times daily. 07/06/20  Yes [provider]  Polyethylene Glycol 3350 (MIRALAX PO) Take 238 g by mouth once. Colonoscopy prep    [provider]    ROS: All other systems have been reviewed and were otherwise negative with the exception of those mentioned in the HPI and as above.  Physical Exam: General: Alert, no acute distress Cardiovascular: No pedal edema Respiratory: No cyanosis, no use of accessory musculature GI: No organomegaly, abdomen is soft and non-tender Skin: No lesions in the area of chief complaint Neurologic: Sensation intact distally Psychiatric: Patient is competent for consent with normal mood and affect Lymphatic: No axillary or cervical lymphadenopathy  MUSCULOSKELETAL:  Right knee demonstrates gross tenderness to palpation about the medial joint line.  No instability.  Positive McMurray.  Difficult exam secondary to patient discomfort.  No obvious swelling.  Imaging: MRI and x-rays reviewed in [facility] and x-rays demonstrate no obvious acute osseous abnormalities on four views.  MRI demonstrates significant medial compartment change with subchondral marrow edema throughout, including all the way up to the tibial tubercle.  Extensor mechanism is intact.  Assessment: Subchondral marrow edema and large medial meniscus tear on the right knee.  Osteochondral lesion and subchondral fracture of the medial tibial plateau, 1 x 2 centimeters.  Plan: Plan for Procedure(s): Arthroscopic meniscectomy and Subchondroplasty   We think that an arthroscopy with medial meniscectomy and possible loose body excision would be reasonable.  Additionally, Subchondroplasty would be reasonable.  If she  doesn't make progress with these things then we would likely talk about an arthroplasty with one of my partners.  Risks, benefits and alternatives discussed.  She knows she will be out of work for 4-6 weeks.  She can walk on it right away.  The risks benefits and alternatives were discussed with the patient including but not limited to the risks of nonoperative treatment, versus surgical intervention including infection, bleeding, nerve injury,  blood clots, cardiopulmonary complications, morbidity, mortality, among others, and they were willing to proceed.   The patient acknowledged the explanation, agreed to proceed with the plan and consent was signed.   Operative Plan: Right knee scope with medial meniscectomy, possible loose body excision, and subchondroplasty Discharge Medications: on methadone.  Tylenol, celebrex, robaxin, zofran DVT Prophylaxis: Aspirin Physical Therapy: +/- outpatient PT Special Discharge needs: knee immobilizer if receives a block   Vernetta Honey, PA-C  10/08/2020 5:07 PM

## 2020-10-09 ENCOUNTER — Ambulatory Visit (HOSPITAL_BASED_OUTPATIENT_CLINIC_OR_DEPARTMENT_OTHER)
Admission: RE | Admit: 2020-10-09 | Discharge: 2020-10-09 | Disposition: A | Discharge status: Home / Self Care | Payer: 59 | Attending: Orthopaedic Surgery | Admitting: Orthopaedic Surgery

## 2020-10-09 ENCOUNTER — Ambulatory Visit (HOSPITAL_BASED_OUTPATIENT_CLINIC_OR_DEPARTMENT_OTHER): Payer: 59 | Admitting: Anesthesiology

## 2020-10-09 ENCOUNTER — Other Ambulatory Visit: Payer: Self-pay

## 2020-10-09 ENCOUNTER — Encounter (HOSPITAL_BASED_OUTPATIENT_CLINIC_OR_DEPARTMENT_OTHER): Payer: Self-pay | Admitting: Orthopaedic Surgery

## 2020-10-09 ENCOUNTER — Encounter (HOSPITAL_BASED_OUTPATIENT_CLINIC_OR_DEPARTMENT_OTHER)
Admission: RE | Disposition: A | Discharge status: Home / Self Care | Payer: Self-pay | Source: Home / Self Care | Attending: Orthopaedic Surgery

## 2020-10-09 DIAGNOSIS — M1711 Unilateral primary osteoarthritis, right knee: Secondary | ICD-10-CM | POA: Insufficient documentation

## 2020-10-09 DIAGNOSIS — S82141A Displaced bicondylar fracture of right tibia, initial encounter for closed fracture: Secondary | ICD-10-CM | POA: Diagnosis not present

## 2020-10-09 DIAGNOSIS — S83241A Other tear of medial meniscus, current injury, right knee, initial encounter: Secondary | ICD-10-CM | POA: Diagnosis present

## 2020-10-09 DIAGNOSIS — X501XXA Overexertion from prolonged static or awkward postures, initial encounter: Secondary | ICD-10-CM | POA: Diagnosis not present

## 2020-10-09 HISTORY — PX: CHONDROPLASTY: SHX5177

## 2020-10-09 HISTORY — PX: KNEE ARTHROSCOPY WITH LATERAL MENISECTOMY: SHX6193

## 2020-10-09 HISTORY — PX: KNEE ARTHROSCOPY WITH MEDIAL MENISECTOMY: SHX5651

## 2020-10-09 HISTORY — PX: KNEE ARTHROSCOPY WITH SUBCHONDROPLASTY: SHX6732

## 2020-10-09 LAB — POCT PREGNANCY, URINE: Preg Test, Ur: NEGATIVE

## 2020-10-09 SURGERY — ARTHROSCOPY, KNEE, WITH SUBCHONDROPLASTY
Anesthesia: General | Site: Knee | Laterality: Right

## 2020-10-09 MED ORDER — CEFAZOLIN SODIUM-DEXTROSE 2-4 GM/100ML-% IV SOLN
INTRAVENOUS | Status: AC
  Filled 2020-10-09: qty 100

## 2020-10-09 MED ORDER — DEXAMETHASONE SODIUM PHOSPHATE 10 MG/ML IJ SOLN
INTRAMUSCULAR | Status: DC | PRN
  Administered 2020-10-09: 5 mg via INTRAVENOUS

## 2020-10-09 MED ORDER — LIDOCAINE HCL (CARDIAC) PF 100 MG/5ML IV SOSY
PREFILLED_SYRINGE | INTRAVENOUS | Status: DC | PRN
  Administered 2020-10-09: 100 mg via INTRAVENOUS

## 2020-10-09 MED ORDER — ONDANSETRON HCL 4 MG PO TABS: 4.0000 mg | ORAL_TABLET | Freq: Three times a day (TID) | ORAL | 1 refills | Status: AC | PRN

## 2020-10-09 MED ORDER — CELECOXIB 100 MG PO CAPS: 100.0000 mg | ORAL_CAPSULE | Freq: Two times a day (BID) | ORAL | 0 refills | Status: AC

## 2020-10-09 MED ORDER — ASPIRIN 81 MG PO CHEW: 81.0000 mg | CHEWABLE_TABLET | Freq: Two times a day (BID) | ORAL | 0 refills | Status: AC

## 2020-10-09 MED ORDER — METHOCARBAMOL 500 MG PO TABS: 500.0000 mg | ORAL_TABLET | Freq: Three times a day (TID) | ORAL | 0 refills | Status: DC | PRN

## 2020-10-09 MED ORDER — PROPOFOL 10 MG/ML IV BOLUS
INTRAVENOUS | Status: AC
  Filled 2020-10-09: qty 20

## 2020-10-09 MED ORDER — MIDAZOLAM HCL 2 MG/2ML IJ SOLN
INTRAMUSCULAR | Status: AC
  Filled 2020-10-09: qty 2

## 2020-10-09 MED ORDER — FENTANYL CITRATE (PF) 100 MCG/2ML IJ SOLN
100.0000 ug | Freq: Once | INTRAMUSCULAR | Status: AC
  Administered 2020-10-09: 100 ug via INTRAVENOUS

## 2020-10-09 MED ORDER — SODIUM CHLORIDE 0.9 % IR SOLN
Status: DC | PRN
  Administered 2020-10-09: 1500 mL

## 2020-10-09 MED ORDER — ACETAMINOPHEN 500 MG PO TABS: 1000.0000 mg | ORAL_TABLET | Freq: Three times a day (TID) | ORAL | 0 refills | Status: AC

## 2020-10-09 MED ORDER — KETOROLAC TROMETHAMINE 10 MG PO TABS: 10.0000 mg | ORAL_TABLET | Freq: Three times a day (TID) | ORAL | 0 refills | Status: DC

## 2020-10-09 MED ORDER — ONDANSETRON HCL 4 MG/2ML IJ SOLN
INTRAMUSCULAR | Status: DC | PRN
  Administered 2020-10-09: 4 mg via INTRAVENOUS

## 2020-10-09 MED ORDER — CEFAZOLIN SODIUM-DEXTROSE 2-4 GM/100ML-% IV SOLN: 2.0000 g | INTRAVENOUS | Status: DC

## 2020-10-09 MED ORDER — LACTATED RINGERS IV SOLN: INTRAVENOUS | Status: DC

## 2020-10-09 MED ORDER — PROPOFOL 10 MG/ML IV BOLUS
INTRAVENOUS | Status: DC | PRN
  Administered 2020-10-09: 150 mg via INTRAVENOUS
  Administered 2020-10-09: 50 mg via INTRAVENOUS

## 2020-10-09 MED ORDER — HYDROMORPHONE HCL 1 MG/ML IJ SOLN
0.2500 mg | INTRAMUSCULAR | Status: DC | PRN
  Administered 2020-10-09 (×2): 0.5 mg via INTRAVENOUS

## 2020-10-09 MED ORDER — LIDOCAINE 2% (20 MG/ML) 5 ML SYRINGE
INTRAMUSCULAR | Status: AC
  Filled 2020-10-09: qty 5

## 2020-10-09 MED ORDER — FENTANYL CITRATE (PF) 100 MCG/2ML IJ SOLN
INTRAMUSCULAR | Status: AC
  Filled 2020-10-09: qty 2

## 2020-10-09 MED ORDER — LACTATED RINGERS IV SOLN: INTRAVENOUS | Status: DC | PRN

## 2020-10-09 MED ORDER — OMEPRAZOLE 20 MG PO CPDR: 20.0000 mg | DELAYED_RELEASE_CAPSULE | Freq: Every day | ORAL | 0 refills | Status: DC

## 2020-10-09 MED ORDER — ROPIVACAINE HCL 7.5 MG/ML IJ SOLN
INTRAMUSCULAR | Status: DC | PRN
  Administered 2020-10-09: 20 mL via PERINEURAL

## 2020-10-09 MED ORDER — KETOROLAC TROMETHAMINE 30 MG/ML IJ SOLN
INTRAMUSCULAR | Status: DC | PRN
  Administered 2020-10-09: 30 mg via INTRAVENOUS

## 2020-10-09 MED ORDER — EPHEDRINE SULFATE 50 MG/ML IJ SOLN
INTRAMUSCULAR | Status: DC | PRN
  Administered 2020-10-09: 15 mg via INTRAVENOUS

## 2020-10-09 MED ORDER — ONDANSETRON HCL 4 MG/2ML IJ SOLN: 4.0000 mg | Freq: Once | INTRAMUSCULAR | Status: DC | PRN

## 2020-10-09 MED ORDER — HYDROMORPHONE HCL 1 MG/ML IJ SOLN
INTRAMUSCULAR | Status: AC
  Filled 2020-10-09: qty 0.5

## 2020-10-09 MED ORDER — ONDANSETRON HCL 4 MG/2ML IJ SOLN
INTRAMUSCULAR | Status: AC
  Filled 2020-10-09: qty 2

## 2020-10-09 MED ORDER — MEPERIDINE HCL 25 MG/ML IJ SOLN: 6.2500 mg | INTRAMUSCULAR | Status: DC | PRN

## 2020-10-09 MED ORDER — MIDAZOLAM HCL 2 MG/2ML IJ SOLN
2.0000 mg | Freq: Once | INTRAMUSCULAR | Status: AC
  Administered 2020-10-09: 2 mg via INTRAVENOUS

## 2020-10-09 MED ORDER — SODIUM CHLORIDE 0.9 % IR SOLN
Status: DC | PRN
  Administered 2020-10-09: 3000 mL

## 2020-10-09 SURGICAL SUPPLY — 81 items
APL PRP STRL LF DISP 70% ISPRP (MISCELLANEOUS) ×2
BANDAGE ESMARK 6X9 LF (GAUZE/BANDAGES/DRESSINGS) IMPLANT
BLADE HEX COATED 2.75 (ELECTRODE) IMPLANT
BLADE SURG 10 STRL SS (BLADE) IMPLANT
BLADE SURG 15 STRL LF DISP TIS (BLADE) ×2 IMPLANT
BLADE SURG 15 STRL SS (BLADE) ×3
BNDG CMPR 9X6 STRL LF SNTH (GAUZE/BANDAGES/DRESSINGS)
BNDG ELASTIC 4X5.8 VLCR STR LF (GAUZE/BANDAGES/DRESSINGS) IMPLANT
BNDG ELASTIC 6X5.8 VLCR STR LF (GAUZE/BANDAGES/DRESSINGS) ×3 IMPLANT
BNDG ESMARK 6X9 LF (GAUZE/BANDAGES/DRESSINGS)
CANISTER SUCT 1200ML W/VALVE (MISCELLANEOUS) IMPLANT
CHLORAPREP W/TINT 26 (MISCELLANEOUS) ×3 IMPLANT
CLSR STERI-STRIP ANTIMIC 1/2X4 (GAUZE/BANDAGES/DRESSINGS) IMPLANT
COOLER ICEMAN CLASSIC (MISCELLANEOUS) ×3 IMPLANT
COVER WAND RF STERILE (DRAPES) IMPLANT
CUFF TOURN SGL QUICK 34 (TOURNIQUET CUFF)
CUFF TRNQT CYL 34X4.125X (TOURNIQUET CUFF) IMPLANT
DECANTER SPIKE VIAL GLASS SM (MISCELLANEOUS) IMPLANT
DRAPE ARTHROSCOPY W/POUCH 90 (DRAPES) ×3 IMPLANT
DRAPE C-ARM 42X72 X-RAY (DRAPES) IMPLANT
DRAPE C-ARMOR (DRAPES) IMPLANT
DRAPE EXTREMITY T 121X128X90 (DISPOSABLE) IMPLANT
DRAPE IMP U-DRAPE 54X76 (DRAPES) IMPLANT
DRAPE INCISE IOBAN 66X45 STRL (DRAPES) IMPLANT
DRAPE OEC MINIVIEW 54X84 (DRAPES) ×3 IMPLANT
DRAPE U-SHAPE 47X51 STRL (DRAPES) ×3 IMPLANT
DRSG AQUACEL AG ADV 3.5X 6 (GAUZE/BANDAGES/DRESSINGS) IMPLANT
DRSG PAD ABDOMINAL 8X10 ST (GAUZE/BANDAGES/DRESSINGS) IMPLANT
ELECT REM PT RETURN 9FT ADLT (ELECTROSURGICAL) ×3
ELECTRODE REM PT RTRN 9FT ADLT (ELECTROSURGICAL) ×2 IMPLANT
FIBER TAPE 2MM (SUTURE) IMPLANT
GAUZE SPONGE 4X4 12PLY STRL (GAUZE/BANDAGES/DRESSINGS) ×3 IMPLANT
GLOVE BIO SURGEON STRL SZ 6.5 (GLOVE) ×3 IMPLANT
GLOVE BIO SURGEON STRL SZ7 (GLOVE) ×6 IMPLANT
GLOVE BIOGEL PI IND STRL 6.5 (GLOVE) ×4 IMPLANT
GLOVE BIOGEL PI IND STRL 8 (GLOVE) ×4 IMPLANT
GLOVE BIOGEL PI INDICATOR 6.5 (GLOVE) ×2
GLOVE BIOGEL PI INDICATOR 8 (GLOVE) ×2
GLOVE ECLIPSE 6.5 STRL STRAW (GLOVE) ×6 IMPLANT
GLOVE ECLIPSE 8.0 STRL XLNG CF (GLOVE) ×3 IMPLANT
GOWN STRL REUS W/ TWL LRG LVL3 (GOWN DISPOSABLE) ×6 IMPLANT
GOWN STRL REUS W/TWL LRG LVL3 (GOWN DISPOSABLE) ×9
GOWN STRL REUS W/TWL XL LVL3 (GOWN DISPOSABLE) ×3 IMPLANT
IMMOBILIZER KNEE 22 UNIV (SOFTGOODS) ×3 IMPLANT
IMMOBILIZER KNEE 24 THIGH 36 (MISCELLANEOUS) IMPLANT
IMMOBILIZER KNEE 24 UNIV (MISCELLANEOUS)
KIT CEMENT MACRO CA PHOSPHATE (Cement) ×3 IMPLANT
KIT IOBP KNEE OPEN TIP (KITS) ×3 IMPLANT
MANIFOLD NEPTUNE II (INSTRUMENTS) ×3 IMPLANT
NDL SUT 6 .5 CRC .975X.05 MAYO (NEEDLE) IMPLANT
NEEDLE KEITH (NEEDLE) IMPLANT
NEEDLE MAYO TAPER (NEEDLE)
NS IRRIG 1000ML POUR BTL (IV SOLUTION) IMPLANT
PACK ARTHROSCOPY DSU (CUSTOM PROCEDURE TRAY) ×3 IMPLANT
PACK BASIN DAY SURGERY FS (CUSTOM PROCEDURE TRAY) ×3 IMPLANT
PAD CAST 4YDX4 CTTN HI CHSV (CAST SUPPLIES) IMPLANT
PAD COLD SHLDR WRAP-ON (PAD) ×3 IMPLANT
PADDING CAST COTTON 4X4 STRL (CAST SUPPLIES)
PADDING CAST COTTON 6X4 STRL (CAST SUPPLIES) IMPLANT
PENCIL SMOKE EVACUATOR (MISCELLANEOUS) IMPLANT
SLEEVE SCD COMPRESS KNEE MED (MISCELLANEOUS) ×3 IMPLANT
SPLINT FAST PLASTER 5X30 (CAST SUPPLIES)
SPLINT PLASTER CAST FAST 5X30 (CAST SUPPLIES) IMPLANT
SPONGE LAP 18X18 RF (DISPOSABLE) ×3 IMPLANT
SUCTION FRAZIER HANDLE 10FR (MISCELLANEOUS)
SUCTION TUBE FRAZIER 10FR DISP (MISCELLANEOUS) IMPLANT
SUT FIBERWIRE #2 38 T-5 BLUE (SUTURE)
SUT FIBERWIRE 2-0 18 17.9 3/8 (SUTURE)
SUT MNCRL AB 4-0 PS2 18 (SUTURE) ×3 IMPLANT
SUT VIC AB 0 CT1 27 (SUTURE)
SUT VIC AB 0 CT1 27XBRD ANBCTR (SUTURE) IMPLANT
SUT VIC AB 1 CT1 27 (SUTURE)
SUT VIC AB 1 CT1 27XBRD ANBCTR (SUTURE) IMPLANT
SUT VIC AB 3-0 SH 27 (SUTURE) ×3
SUT VIC AB 3-0 SH 27X BRD (SUTURE) ×2 IMPLANT
SUTURE FIBERWR #2 38 T-5 BLUE (SUTURE) IMPLANT
SUTURE FIBERWR 2-0 18 17.9 3/8 (SUTURE) IMPLANT
SYR BULB IRRIG 60ML STRL (SYRINGE) IMPLANT
TOWEL GREEN STERILE FF (TOWEL DISPOSABLE) ×6 IMPLANT
TUBE SUCTION HIGH CAP CLEAR NV (SUCTIONS) IMPLANT
TUBING ARTHROSCOPY IRRIG 16FT (MISCELLANEOUS) ×3 IMPLANT

## 2020-10-09 NOTE — Anesthesia Procedure Notes (Signed)
Anesthesia Regional Block: Adductor canal block   Pre-Anesthetic Checklist: ,, timeout performed, Correct Patient, Correct Site, Correct Laterality, Correct Procedure, Correct Position, site marked, Risks and benefits discussed,  Surgical consent,  Pre-op evaluation,  At surgeon's request and post-op pain management  Laterality: Right  Prep: chloraprep       Needles:  Injection technique: Single-shot  Needle Type: Echogenic Stimulator Needle     Needle Length: 9cm  Needle Gauge: 21     Additional Needles:   Narrative:  Start time: 10/09/2020 12:44 PM End time: 10/09/2020 12:24 PM Injection made incrementally with aspirations every 5 mL.  Performed by: Personally  Anesthesiologist: Arta Bruce, MD  Additional Notes: Monitors applied. Patient sedated. Sterile prep and drape,hand hygiene and sterile gloves were used. Relevant anatomy identified.Needle position confirmed.Local anesthetic injected incrementally after negative aspiration. Local anesthetic spread visualized around nerve(s). Vascular puncture avoided. No complications. Image printed for medical record.The patient tolerated the procedure well.    Arta Bruce MD

## 2020-10-09 NOTE — Transfer of Care (Signed)
Immediate Anesthesia Transfer of Care Note  Patient: Jasmine Mejia  Procedure(s) Performed: KNEE ARTHROSCOPY WITH SUBCHONDROPLASTY (Right Knee) CHONDROPLASTY (Right Knee) KNEE ARTHROSCOPY WITH MEDIAL MENISECTOMY (Right Knee) KNEE ARTHROSCOPY WITH LATERAL MENISECTOMY (Right Knee)  Patient Location: PACU  Anesthesia Type:General and Regional  Level of Consciousness: awake, alert  and oriented  Airway & Oxygen Therapy: Patient Spontanous Breathing and Patient connected to face mask oxygen  Post-op Assessment: Report given to RN and Post -op Vital signs reviewed and stable  Post vital signs: Reviewed and stable  Last Vitals:  Vitals Value Taken Time  BP    Temp    Pulse 74 10/09/20 1429  Resp 9 10/09/20 1429  SpO2 95 % 10/09/20 1429  Vitals shown include unvalidated device data.  Last Pain:  Vitals:   10/09/20 1241  TempSrc: Oral  PainSc: 9          Complications: No complications documented.

## 2020-10-09 NOTE — Anesthesia Postprocedure Evaluation (Signed)
Anesthesia Post Note  Patient: Jasmine Mejia  Procedure(s) Performed: KNEE ARTHROSCOPY WITH SUBCHONDROPLASTY (Right Knee) CHONDROPLASTY (Right Knee) KNEE ARTHROSCOPY WITH MEDIAL MENISECTOMY (Right Knee) KNEE ARTHROSCOPY WITH LATERAL MENISECTOMY (Right Knee)     Patient location during evaluation: PACU Anesthesia Type: General Level of consciousness: awake and alert Pain management: pain level controlled Vital Signs Assessment: post-procedure vital signs reviewed and stable Respiratory status: spontaneous breathing, nonlabored ventilation, respiratory function stable and patient connected to nasal cannula oxygen Cardiovascular status: blood pressure returned to baseline and stable Postop Assessment: no apparent nausea or vomiting Anesthetic complications: no   No complications documented.  Last Vitals:  Vitals:   10/09/20 1524 10/09/20 1530  BP:  (!) 143/63  Pulse: 72 72  Resp: 14 16  Temp:    SpO2: 100% 100%    Last Pain:  Vitals:   10/09/20 1530  TempSrc:   PainSc: 5                  Dominick Zertuche DAVID

## 2020-10-09 NOTE — Progress Notes (Signed)
AssistedDr. Ossey with right, ultrasound guided, adductor canal block. Side rails up, monitors on throughout procedure. See vital signs in flow sheet. Tolerated Procedure well.  

## 2020-10-09 NOTE — Anesthesia Procedure Notes (Signed)
Procedure Name: LMA Insertion Performed by: Haneen Bernales M, CRNA Pre-anesthesia Checklist: Patient identified, Emergency Drugs available, Suction available and Patient being monitored Patient Re-evaluated:Patient Re-evaluated prior to induction Oxygen Delivery Method: Circle system utilized Preoxygenation: Pre-oxygenation with 100% oxygen Induction Type: IV induction Ventilation: Mask ventilation without difficulty LMA: LMA inserted LMA Size: 4.0 Tube type: Oral Number of attempts: 1 Airway Equipment and Method: Bite block Placement Confirmation: positive ETCO2,  CO2 detector and breath sounds checked- equal and bilateral Tube secured with: Tape Dental Injury: Teeth and Oropharynx as per pre-operative assessment        

## 2020-10-09 NOTE — Op Note (Signed)
Orthopaedic Surgery Operative Note (CSN: [id number])  Jasmine Mejia  March 10, 1966 Date of Surgery: 10/09/2020   Diagnoses:  Right medial knee pain with known meniscus tear and subchondral insufficiency fracture  Procedure: Right knee subchondroplasty with open reduction internal fixation of the medial sided plateau insufficiency fracture Chondroplasty medial femoral condyle lateral femoral condyle Medial lateral meniscectomy partial   Operative Finding Exam under anesthesia: Full motion no limitation, no loose bodies Suprapatellar pouch: Normal Patellofemoral Compartment: Grade 4 cartilage loss on the lateral facet of the patella Medial Compartment: Grade 4 cartilage loss of the femur, grade 3 of the tibia there was a fracture line actually in the far medial aspect of the plateau that was mostly underneath the meniscus.  There was a root tear with significant degeneration the posterior medial meniscus.  Meniscus was debrided back to stable base and there were essentially no radial fibers still in place.  Patient had 40% total meniscal volume resected. Lateral Compartment: Frayed tear of the anterior lateral meniscus.  She is debrided back.  Patient grade 3 and changes of the tibial plateau and grade 2 changes in the femur. Intercondylar Notch: Normal  Successful completion of the planned procedure.  Patient had significant arthritic disease more so than her x-rays and MRI resulted in.  She did have a fracture of her medial femoral condyle that was not apparent on MRI or x-ray as well.  We think that she may get good relief temporarily albeit from her procedure and she may if she fails to be a candidate for a total knee arthroplasty but we do not think that further arthroscopic procedures or partial arthroplasty options would be beneficial for her.  Her history is complicated by her methadone use and we are not able to really use any narcotics in the setting of methadone use.  Post-operative plan:  The patient will be discharged home.  The patient will be weightbearing to tolerance.  DVT prophylaxis Aspirin 81 mg twice daily for 6 weeks.  Pain control with PRN pain medication preferring oral medicines.  Follow up plan will be scheduled in approximately 7 days for incision check and XR.  Post-Op Diagnosis: Same Surgeons:Primary: Hiram Gash, MD Assistants:Caroline McBane PA-C Location: [facility] ROOM 5 Anesthesia: General with abductor canal Antibiotics: Ancef 2 g Tourniquet time:  Total Tourniquet Time Documented: Thigh (Right) - 37 minutes Total: Thigh (Right) - 37 minutes  Estimated Blood Loss: Minimal Complications: None Specimens: None Implants: Implant Name Type Inv. Item Serial No. Manufacturer Lot No. LRB No. Used Action  Arthrex Quickset kit    [employer] [id number] Right 1 Implanted    Indications for Surgery:   Jasmine Mejia is a 54 y.o. female with known OA though not bone on bone and MRI findings of meniscus tear and subchondral insufficiency fracture.  Benefits and risks of operative and nonoperative management were discussed prior to surgery with patient/guardian(s) and informed consent form was completed.  Specific risks including infection, need for additional surgery, extravasation of the bone cement, fracture and need for arthroplasty were discussed amongst others as well as incomplete pain relief in the setting of her chronic medication use.   Procedure:   The patient was identified properly. Informed consent was obtained and the surgical site was marked. The patient was taken up to suite where general anesthesia was induced. The patient was placed in the supine position with a post against the surgical leg and a nonsterile tourniquet applied. The surgical leg was then prepped and draped usual  sterile fashion.  A standard surgical timeout was performed.  2 standard anterior portals were made and diagnostic arthroscopy performed. Please note the findings as noted  above.  We performed medial lateral partial meniscectomies as above.  Gentle chondroplasty performed with a shaver to the 3 compartments of the knee.  That point we noted the fracture line that was mostly covered by the meniscus that was seemed relatively stable.  We used the Arthrex interosseous bio plasty/subchondroplasty kit to place a trocar needle into the medial plateau under x-ray guidance.  Once this was placed we are able to inject calcium phosphate until we are able to get resistance.  We noted that none of the calcium phosphate extravasated into the joint.  Once this was complete we were able to let the area set and confirm its placement x-ray and fluoroscopic imaging as well as with the arthroscope.  Were happy with our fill.  We waited 8 minutes for the material to set.  Incisions closed with absorbable suture. The patient was awoken from general anesthesia and taken to the PACU in stable condition without complication.   Noemi Chapel, PA-C, present and scrubbed throughout the case, critical for completion in a timely fashion, and for retraction, instrumentation, closure.

## 2020-10-09 NOTE — Interval H&P Note (Signed)
History and Physical Interval Note:  10/09/2020 12:25 PM  Jasmine Mejia  has presented today for surgery, with the diagnosis of RIGHT TIBIA PLATEAU FRACTURE.  The various methods of treatment have been discussed with the patient and family. After consideration of risks, benefits and other options for treatment, the patient has consented to  Procedure(s): OPEN REDUCTION INTERNAL FIXATION (ORIF) TIBIA FRACTURE (Right) as a surgical intervention.  The patient's history has been reviewed, patient examined, no change in status, stable for surgery.  I have reviewed the patient's chart and labs.  Questions were answered to the patient's satisfaction.    We were very clear that narcotics would not be effective with her pain contract and methadone prescribing so we would lean on her baseline medicines to treat her pain.     Bjorn Pippin

## 2020-10-09 NOTE — Discharge Instructions (Signed)
Ramond Marrow MD, MPH  Alfonse Alpers, PA-C  [facility]  [address], [address]  [phone] (tel)   [phone] (fax)    POST-OPERATIVE INSTRUCTIONS - Knee Arthroscopy   WOUND CARE  - You may remove the Operative Dressing on Post-Op Day #3 (72hrs after surgery).   - Alternatively if you would like you can leave dressing on until follow-up if within 7-8 days but keep it dry.  - Leave steri-strips in place until they fall off on their own, usually 2 weeks postop.  - An ACE wrap may be used to control swelling, do not wrap this too tight. If the initial ACE wrap feels too tight you may loosen it.  - There may be a small amount of fluid/bleeding leaking at the surgical site.  - This is normal; the knee is filled with fluid during the procedure and can leak for 24-48hrs after surgery. You may change/reinforce the bandage as needed.  - Use the Cryocuff or Ice as often as possible for the first 7 days, then as needed for pain relief. Always keep a towel, ACE wrap or other barrier between the cooling unit and your skin.  - You may shower on Post-Op Day #3. Gently pat the area dry. Do not soak the knee in water or submerge it.  - Do not go swimming in the pool or ocean until 4 weeks after surgery or when otherwise instructed. Keep dry incisions as dry as possible.    BRACE/AMBULATION  - You will be placed in a knee immobilizer after surgery  - You should wear your brace until your nerve block wears off, after that you do not need it  - If needed you may use crutches to help you ambulate  - You may put full weight on your operative leg   REGIONAL ANESTHESIA (NERVE BLOCKS)  - The anesthesia team may have performed a nerve block for you if safe in the setting of your care. This is a great tool used to minimize pain. Typically the block may start wearing off overnight. This can be a challenging period but please utilize your as needed pain medications to try and  manage this period and know it will be a brief transition as the nerve block wears completely   POST-OP MEDICATIONS - Multimodal approach to pain control  - In general your pain will be controlled with a combination of substances. Prescriptions unless otherwise discussed are electronically sent to your pharmacy. This is a carefully made plan we use to minimize narcotic use.    - Toradol - take as scheduled for 3 days                 - Once you finish the Toradol, you will start Celebrex                - Do NOT take these medications together! - Celebrex - Anti-inflammatory medication taken on a scheduled basis  - Acetaminophen - Non-narcotic pain medicine taken on a scheduled basis  - Aspirin 81mg  - This medicine is used to minimize the risk of blood clots after surgery.  - Zofran - take as needed for nausea  - Omeprazole - this is to protect your stomach while taking NSAIDs  - Robaxin - take as needed for muscle spasms  - Continue your Methadone as prescribed by your pain management provider.   FOLLOW-UP   Please call the office to schedule a follow-up appointment for your incision check,  7-10 days post-operatively.   IF YOU HAVE ANY QUESTIONS, PLEASE FEEL FREE TO CALL OUR OFFICE.    HELPFUL INFORMATION   - If you had a block, it will wear off between 8-24 hrs postop typically. This is period when your pain may go from nearly zero to the pain you would have had post-op without the block. This is an abrupt transition but nothing dangerous is happening. You may take an extra dose of narcotic when this happens.    Keep your leg elevated to decrease swelling, which will then in turn decrease your pain. I would elevate the foot of your bed by putting a couple of couch pillows between your mattress and box spring. I would not keep pillow directly under your ankle.   - Do not sleep with a pillow behind your knee even if it is more comfortable as this may make it harder to get your knee  fully straight long term.    There will be MORE swelling on days 1-3 than there is on the day of surgery. This also is normal. The swelling will decrease with the anti-inflammatory medication, ice and keeping it elevated. The swelling will make it more difficult to bend your knee. As the swelling goes down your motion will become easier    You may develop swelling and bruising that extends from your knee down to your calf and perhaps even to your foot over the next week. Do not be alarmed. This too is normal, and it is due to gravity    There may be some numbness adjacent to the incision site. This may last for 6-12 months or longer in some patients and is expected.    You may return to sedentary work/school in the next couple of days when you feel up to it. You will need to keep your leg elevated as much as possible    You should wean off your narcotic medicines as soon as you are able. Most patients will be off or using minimal narcotics before their first postop appointment.    We suggest you use the pain medication the first night prior to going to bed, in order to ease any pain when the anesthesia wears off. You should avoid taking pain medications on an empty stomach as it will make you nauseous.    Do not drink alcoholic beverages or take illicit drugs when taking pain medications.    It is against the law to drive while taking narcotics. You cannot drive if your Right leg is in brace locked in extension.    Pain medication may make you constipated. Below are a few solutions to try in this order:  o Decrease the amount of pain medication if you aren't having pain.  o Drink lots of decaffeinated fluids.  o Drink prune juice and/or eat dried prunes   o If the first 3 don't work start with additional solutions  o Take Colace - an over-the-counter stool softener  o Take Senokot - an over-the-counter laxative  o Take Miralax - a stronger over-the-counter laxative   No  ibuprofen before 8:00pm if needed  Post Anesthesia Home Care Instructions  Activity: Get plenty of rest for the remainder of the day. A responsible individual must stay with you for 24 hours following the procedure.  For the next 24 hours, DO NOT: -Drive a car -Advertising copywriter -Drink alcoholic beverages -Take any medication unless instructed by your physician -Make any legal decisions or sign important papers.  Meals:  Start with liquid foods such as gelatin or soup. Progress to regular foods as tolerated. Avoid greasy, spicy, heavy foods. If nausea and/or vomiting occur, drink only clear liquids until the nausea and/or vomiting subsides. Call your physician if vomiting continues.  Special Instructions/Symptoms: Your throat may feel dry or sore from the anesthesia or the breathing tube placed in your throat during surgery. If this causes discomfort, gargle with warm salt water. The discomfort should disappear within 24 hours.  If you had a scopolamine patch placed behind your ear for the management of post- operative nausea and/or vomiting:  1. The medication in the patch is effective for 72 hours, after which it should be removed.  Wrap patch in a tissue and discard in the trash. Wash hands thoroughly with soap and water. 2. You may remove the patch earlier than 72 hours if you experience unpleasant side effects which may include dry mouth, dizziness or visual disturbances. 3. Avoid touching the patch. Wash your hands with soap and water after contact with the patch.     Regional Anesthesia Blocks  1. Numbness or the inability to move the "blocked" extremity may last from 3-48 hours after placement. The length of time depends on the medication injected and your individual response to the medication. If the numbness is not going away after 48 hours, call your surgeon.  2. The extremity that is blocked will need to be protected until the numbness is gone and the  Strength has returned.  Because you cannot feel it, you will need to take extra care to avoid injury. Because it may be weak, you may have difficulty moving it or using it. You may not know what position it is in without looking at it while the block is in effect.  3. For blocks in the legs and feet, returning to weight bearing and walking needs to be done carefully. You will need to wait until the numbness is entirely gone and the strength has returned. You should be able to move your leg and foot normally before you try and bear weight or walk. You will need someone to be with you when you first try to ensure you do not fall and possibly risk injury.  4. Bruising and tenderness at the needle site are common side effects and will resolve in a few days.  5. Persistent numbness or new problems with movement should be communicated to the surgeon or the [facility] [phone] [facility] [phone]).

## 2020-10-09 NOTE — Anesthesia Preprocedure Evaluation (Signed)
Anesthesia Evaluation  Patient identified by MRN, date of birth, ID band Patient awake    Reviewed: Allergy & Precautions, NPO status , Patient's Chart, lab work & pertinent test results  Airway Mallampati: I  TM Distance: >3 FB Neck ROM: Full    Dental   Pulmonary Current Smoker and Patient abstained from smoking.,    Pulmonary exam normal        Cardiovascular hypertension, Pt. on medications Normal cardiovascular exam     Neuro/Psych Anxiety Depression    GI/Hepatic   Endo/Other    Renal/GU      Musculoskeletal   Abdominal   Peds  Hematology   Anesthesia Other Findings   Reproductive/Obstetrics                             Anesthesia Physical Anesthesia Plan  ASA: III  Anesthesia Plan: General   Post-op Pain Management:  Regional for Post-op pain   Induction: Intravenous  PONV Risk Score and Plan: 2 and Ondansetron and Midazolam  Airway Management Planned: LMA  Additional Equipment:   Intra-op Plan:   Post-operative Plan: Extubation in OR  Informed Consent: I have reviewed the patients History and Physical, chart, labs and discussed the procedure including the risks, benefits and alternatives for the proposed anesthesia with the patient or authorized representative who has indicated his/her understanding and acceptance.       Plan Discussed with: CRNA and Surgeon  Anesthesia Plan Comments: (Pt on Methadone 80mg  per day. Took it this am.)        Anesthesia Quick Evaluation

## 2020-10-11 ENCOUNTER — Encounter (HOSPITAL_BASED_OUTPATIENT_CLINIC_OR_DEPARTMENT_OTHER): Payer: Self-pay | Admitting: Orthopaedic Surgery

## 2020-10-14 ENCOUNTER — Encounter: Payer: 59 | Admitting: Gastroenterology

## 2020-10-15 ENCOUNTER — Other Ambulatory Visit: Payer: Self-pay | Admitting: Medical-Surgical

## 2020-10-25 ENCOUNTER — Encounter: Payer: Self-pay | Admitting: Rehabilitative and Restorative Service Providers"

## 2020-10-25 ENCOUNTER — Ambulatory Visit (INDEPENDENT_AMBULATORY_CARE_PROVIDER_SITE_OTHER): Payer: 59 | Admitting: Rehabilitative and Restorative Service Providers"

## 2020-10-25 ENCOUNTER — Other Ambulatory Visit: Payer: Self-pay

## 2020-10-25 DIAGNOSIS — G8929 Other chronic pain: Secondary | ICD-10-CM

## 2020-10-25 DIAGNOSIS — M6281 Muscle weakness (generalized): Secondary | ICD-10-CM | POA: Diagnosis not present

## 2020-10-25 DIAGNOSIS — M25561 Pain in right knee: Secondary | ICD-10-CM | POA: Diagnosis not present

## 2020-10-25 DIAGNOSIS — R2689 Other abnormalities of gait and mobility: Secondary | ICD-10-CM

## 2020-10-25 NOTE — Therapy (Signed)
[facility] [place] [address] [place], [place], [address] Phone: [phone]   Fax:  [phone]  Physical Therapy Evaluation  Patient Details  Name: Jasmine Mejia MRN: [id number] Date of Birth: 09/11/1966 Referring Provider (PT): Dr Ramond Marrow    Encounter Date: 10/25/2020   PT End of Session - 10/25/20 1101    Visit Number 1    Number of Visits 12    Date for PT Re-Evaluation 12/09/20    PT Start Time 1015    PT Stop Time 1107    PT Time Calculation (min) 52 min    Activity Tolerance Patient tolerated treatment well           Past Medical History:  Diagnosis Date  . Anxiety   . Arthritis   . Asthma   . Depression   . Hypertension     Past Surgical History:  Procedure Laterality Date  . APPENDECTOMY    . CHOLECYSTECTOMY    . CHONDROPLASTY Right 10/09/2020   Procedure: CHONDROPLASTY;  Surgeon: Bjorn Pippin, MD;  Location: [facility];  Service: Orthopedics;  Laterality: Right;  . KNEE ARTHROSCOPY WITH LATERAL MENISECTOMY Right 10/09/2020   Procedure: KNEE ARTHROSCOPY WITH LATERAL MENISECTOMY;  Surgeon: Bjorn Pippin, MD;  Location: [facility];  Service: Orthopedics;  Laterality: Right;  . KNEE ARTHROSCOPY WITH MEDIAL MENISECTOMY Right 10/09/2020   Procedure: KNEE ARTHROSCOPY WITH MEDIAL MENISECTOMY;  Surgeon: Bjorn Pippin, MD;  Location: [facility];  Service: Orthopedics;  Laterality: Right;  . KNEE ARTHROSCOPY WITH SUBCHONDROPLASTY Right 10/09/2020   Procedure: KNEE ARTHROSCOPY WITH SUBCHONDROPLASTY;  Surgeon: Bjorn Pippin, MD;  Location:  SURGERY CENTER;  Service: Orthopedics;  Laterality: Right;  . TUBAL LIGATION      There were no vitals filed for this visit.    Subjective Assessment - 10/25/20 1021    Subjective Patient reports that she has had knee pain for the last 6 months - may have been due to fall. Pain continued to worsen with twisting injury  to Rt knee. MRI showed fracture or medial plateau and torn medial and lateral meniscus. Patient underwent surgery 10/09/20 with no complications. She returned to work 10/21/20. She is having stiffness and pain in the Rt knee.    Pertinent History HTN; asthma; arthritis    Patient Stated Goals get the leg moving again; use the knee; bending and straightening knee    Currently in Pain? Yes    Pain Score 6     Pain Location Knee    Pain Orientation Right    Pain Descriptors / Indicators Aching    Pain Type Chronic pain;Surgical pain    Pain Radiating Towards sometimes into calf    Pain Onset More than a month ago    Pain Frequency Intermittent    Aggravating Factors  standing; walking; bending; lifting    Pain Relieving Factors ice; meds              [facility] PT Assessment - 10/25/20 0001      Assessment   Medical Diagnosis Rt knee scope subchondroplasty with ORIF of medial sided plateau insufficiency fracture; medial and lateral meniscetomy     Referring Provider (PT) Dr Ramond Marrow     Onset Date/Surgical Date 10/09/20   pain since 04/06/20    Hand Dominance Left    Next MD Visit 11/16/20    Prior Therapy none       Precautions   Precautions  None      Restrictions   Other Position/Activity Restrictions WBAT       Balance Screen   Has the patient fallen in the past 6 months No    Has the patient had a decrease in activity level because of a fear of falling?  No    Is the patient reluctant to leave their home because of a fear of falling?  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     Leisure household chores;  Air cabin crewyard/flowers; walking; shopping      Observation/Other Assessments   Skin Integrity well healed scope sites Rt knee     Focus on Therapeutic Outcomes (FOTO)  64% limitation       Sensation   Additional Comments WFL's per pt report       Posture/Postural Control   Posture Comments wt shifted to the Lt; hips flexed forward       AROM   Right Knee Extension -12    Right Knee Flexion 107    Left Knee Extension -7    Left Knee Flexion 142      Strength   Right Hip Flexion 4+/5    Right Hip Extension 4/5    Right Hip ABduction 4+/5    Left Hip Extension 4+/5    Right Knee Flexion 4+/5    Right Knee Extension 4+/5    Left Knee Flexion 5/5    Left Knee Extension 5/5      Flexibility   Hamstrings tight Rt > Lt     Quadriceps tight Rt 90 deg; Lt 120 deg prone     ITB tight Rt > Lt     Piriformis tight Rt       Palpation   Patella mobility hypomobility Rt patella       Ambulation/Gait   Gait Comments antalgic gait with limp Rt LE with Wt bearing Rt                       Objective measurements completed on examination: See above findings.       [facility] Adult PT Treatment/Exercise - 10/25/20 0001      Knee/Hip Exercises: Stretches   Passive Hamstring Stretch Right;2 reps;30 seconds   supine with strap    Quad Stretch Right;2 reps;30 seconds   prone with strap    ITB Stretch Right;2 reps;30 seconds   supine with strap    Piriformis Stretch Right;2 reps;30 seconds   supine travell      Knee/Hip Exercises: Supine   Quad Sets Strengthening;Right;5 reps   5 sec hold    Straight Leg Raises Strengthening;Right;5 reps   ~12 inch lift - 5 sec hold      Cryotherapy   Number Minutes Cryotherapy 10 Minutes    Cryotherapy Location Knee   Rt   Type of Cryotherapy Ice pack      Manual Therapy   Manual therapy comments patellar mobs 1-2 min gentle medial/lateral  PT Education - 10/25/20 1056    Education Details HEP POC    Person(s)  Educated Patient    Methods Explanation;Demonstration;Tactile cues;Verbal cues;Handout    Comprehension Verbalized understanding;Returned demonstration;Verbal cues required;Tactile cues required               PT Long Term Goals - 10/25/20 1240      PT LONG TERM GOAL #1   Title Improve AROM Rt knee to =/> than AROM Lt knee    Time 6    Period Weeks    Status New    Target Date 12/09/20      PT LONG TERM GOAL #2   Title 4+/5 to 5/5 strength Rt LE    Time 6    Period Weeks    Status New    Target Date 12/09/20      PT LONG TERM GOAL #3   Title normal gait pattern with increased standing and walking tolerance for work and ADL's with minimal to no pain Rt knee    Time 6    Period Weeks    Status New    Target Date 12/09/20      PT LONG TERM GOAL #4   Title Independent in HEP    Time 6    Period Weeks    Status New    Target Date 12/09/20      PT LONG TERM GOAL #5   Title Improve FOTO to </= 42% limitation    Time 6    Period Weeks    Status New    Target Date 12/09/20                  Plan - 10/25/20 1233    Clinical Impression Statement Patient presents s/p Rt knee scope with subchondroplasty with ORIF of medial sided plateau insufficiency fracture; chondroplasty; medial and lateral meniscectomy 10/09/20. She has pain in the Rt knee; abnormal gait; decreased ROM, mobilty, strength Rt LE; edema Rt knee; limited functional activities. Patient will benefit from PT to address probelsm identified and return to PLOF.    Stability/Clinical Decision Making Stable/Uncomplicated    Clinical Decision Making Low    Rehab Potential Good    PT Frequency 2x / week    PT Duration 6 weeks    PT Treatment/Interventions ADLs/Self Care Home Management;Aquatic Therapy;Cryotherapy;Electrical Stimulation;Iontophoresis 4mg /ml Dexamethasone;Moist Heat;Ultrasound;Gait training;Stair training;Functional mobility training;Therapeutic activities;Therapeutic exercise;Balance  training;Neuromuscular re-education;Patient/family education;Manual techniques;Passive range of motion;Dry needling;Taping;Vasopneumatic Device    PT Next Visit Plan review HEP; progress with ROM and strengthening; gait training; manual release HS/quads/hip rotators as indicated; modalities as indicated    PT Home Exercise Plan [email or web address]    Consulted and Agree with Plan of Care Patient           Patient will benefit from skilled therapeutic intervention in order to improve the following deficits and impairments:  Abnormal gait, Decreased range of motion, Difficulty walking, Decreased activity tolerance, Pain, Decreased balance, Impaired flexibility, Improper body mechanics, Decreased mobility, Decreased strength, Postural dysfunction  Visit Diagnosis: Acute pain of right knee - Plan: PT plan of care cert/re-cert  Chronic pain of right knee - Plan: PT plan of care cert/re-cert  Muscle weakness (generalized) - Plan: PT plan of care cert/re-cert  Other abnormalities of gait and mobility - Plan: PT plan of care cert/re-cert     Problem List Patient Active Problem List   Diagnosis Date Noted  . Right knee pain 08/23/2020  . Prediabetes 08/23/2020  . Essential hypertension 08/23/2020  .  Anxiety 08/23/2020  . Depression 08/23/2020  . Asthma 08/23/2020  . Hyperlipidemia, mixed 08/23/2020    Yulitza Shorts Rober Minion PT, MPH  10/25/2020, 12:47 PM  [facility] [address] [place], [place], [address] Phone: [phone]   Fax:  [phone]  Name: Jasmine Mejia MRN: [id number] Date of Birth: [birth year]

## 2020-10-25 NOTE — Patient Instructions (Signed)
Access Code: [email or web address]: [email or web address]: 11/19/2021Prepared by: Eladio Dentremont HoltExercises  Prone Quadriceps Stretch with Strap - 2 x daily - 7 x weekly - 1 sets - 3 reps - 30 sec hold  Hooklying Hamstring Stretch with Strap - 2 x daily - 7 x weekly - 1 sets - 3 reps - 30 sec hold  Supine ITB Stretch with Strap - 2 x daily - 7 x weekly - 1 sets - 3 reps - 30 sec hold  Supine Piriformis Stretch with Leg Straight - 2 x daily - 7 x weekly - 1 sets - 3 reps - 30 sec hold  Supine Quad Set - 2 x daily - 7 x weekly - 1 sets - 10 reps - 3 sec hold  Small Range Straight Leg Raise - 2 x daily - 7 x weekly - 1 sets - 10 reps - 5 sec hold

## 2020-11-03 ENCOUNTER — Encounter: Payer: Self-pay | Admitting: Medical-Surgical

## 2020-11-04 ENCOUNTER — Telehealth (INDEPENDENT_AMBULATORY_CARE_PROVIDER_SITE_OTHER): Payer: 59 | Admitting: Medical-Surgical

## 2020-11-04 ENCOUNTER — Ambulatory Visit: Payer: 59 | Admitting: Physical Therapy

## 2020-11-04 ENCOUNTER — Other Ambulatory Visit: Payer: Self-pay

## 2020-11-04 ENCOUNTER — Encounter: Payer: Self-pay | Admitting: Physical Therapy

## 2020-11-04 ENCOUNTER — Encounter: Payer: Self-pay | Admitting: Medical-Surgical

## 2020-11-04 VITALS — HR 66 | Temp 98.1°F

## 2020-11-04 DIAGNOSIS — J0100 Acute maxillary sinusitis, unspecified: Secondary | ICD-10-CM

## 2020-11-04 DIAGNOSIS — M25561 Pain in right knee: Secondary | ICD-10-CM | POA: Diagnosis not present

## 2020-11-04 DIAGNOSIS — R2689 Other abnormalities of gait and mobility: Secondary | ICD-10-CM

## 2020-11-04 DIAGNOSIS — G8929 Other chronic pain: Secondary | ICD-10-CM | POA: Diagnosis not present

## 2020-11-04 DIAGNOSIS — M6281 Muscle weakness (generalized): Secondary | ICD-10-CM

## 2020-11-04 MED ORDER — AMOXICILLIN-POT CLAVULANATE 875-125 MG PO TABS: 1.0000 | ORAL_TABLET | Freq: Two times a day (BID) | ORAL | 0 refills | Status: AC

## 2020-11-04 NOTE — Progress Notes (Signed)
Virtual Visit via Video Note  I connected with Jasmine Mejia on 11/04/20 at  1:00 PM EST by a video enabled telemedicine application and verified that I am speaking with the correct person using two identifiers.   I discussed the limitations of evaluation and management by telemedicine and the availability of in person appointments. The patient expressed understanding and agreed to proceed.  Patient location: home Provider locations: office  Subjective:    CC: upper respiratory symptoms  HPI: Pleasant 54 year old female presenting via MyChart video visit for 1 week of upper respiratory symptoms including sinus pressure, nasal congestion, cough productive of yellow/green mucus, intermittent mild rhinorrhea, and ear fullness. Denies fever, chills, sore throat, shortness of breath, chest pain. She has been taking over-the-counter sinus medications with no relief in symptoms. Drinking plenty of water to avoid water he avoid water to stay hydrated and thin secretions. Was Covid tested yesterday with negative results.  Past medical history, Surgical history, Family history not pertinant except as noted below, Social history, Allergies, and medications have been entered into the medical record, reviewed, and corrections made.   Review of Systems: See HPI for pertinent positives and negatives.   Objective:    General: Speaking clearly in complete sentences without any shortness of breath.  Alert and oriented x3.  Normal judgment. No apparent acute distress.  Impression and Recommendations:    1. Acute non-recurrent maxillary sinusitis Augmentin twice daily x7 days. Okay to continue over-the-counter medications but closely monitor blood pressure. Consider Delsym for cough. Continue with increased PO fluids.  I discussed the assessment and treatment plan with the patient. The patient was provided an opportunity to ask questions and all were answered. The patient agreed with the plan and  demonstrated an understanding of the instructions.   The patient was advised to call back or seek an in-person evaluation if the symptoms worsen or if the condition fails to improve as anticipated.  20 minutes of non-face-to-face time was provided during this encounter.  Return if symptoms worsen or fail to improve.  Thayer Ohm, DNP, APRN, FNP-BC [facility]

## 2020-11-04 NOTE — Therapy (Signed)
[facility] [place] [address] [place], [place], [address] Phone: [phone]   Fax:  [phone]  Physical Therapy Treatment  Patient Details  Name: Jasmine Mejia MRN: [id number] Date of Birth: 11/18/1966 Referring Provider (PT): Dr Ramond Marrow    Encounter Date: 11/04/2020   PT End of Session - 11/04/20 1439    Visit Number 2    Number of Visits 12    Date for PT Re-Evaluation 12/09/20    PT Start Time 1434    PT Stop Time 1516    PT Time Calculation (min) 42 min    Activity Tolerance Patient tolerated treatment well    Behavior During Therapy [facility] for tasks assessed/performed           Past Medical History:  Diagnosis Date   Anxiety    Arthritis    Asthma    Depression    Hypertension     Past Surgical History:  Procedure Laterality Date   APPENDECTOMY     CHOLECYSTECTOMY     CHONDROPLASTY Right 10/09/2020   Procedure: CHONDROPLASTY;  Surgeon: Bjorn Pippin, MD;  Location: [facility];  Service: Orthopedics;  Laterality: Right;   KNEE ARTHROSCOPY WITH LATERAL MENISECTOMY Right 10/09/2020   Procedure: KNEE ARTHROSCOPY WITH LATERAL MENISECTOMY;  Surgeon: Bjorn Pippin, MD;  Location: [facility];  Service: Orthopedics;  Laterality: Right;   KNEE ARTHROSCOPY WITH MEDIAL MENISECTOMY Right 10/09/2020   Procedure: KNEE ARTHROSCOPY WITH MEDIAL MENISECTOMY;  Surgeon: Bjorn Pippin, MD;  Location: [facility];  Service: Orthopedics;  Laterality: Right;   KNEE ARTHROSCOPY WITH SUBCHONDROPLASTY Right 10/09/2020   Procedure: KNEE ARTHROSCOPY WITH SUBCHONDROPLASTY;  Surgeon: Bjorn Pippin, MD;  Location: [facility];  Service: Orthopedics;  Laterality: Right;   TUBAL LIGATION      There were no vitals filed for this visit.   Subjective Assessment - 11/04/20 1440    Subjective Pt reports that her knee is not hurting as bad as it did a week ago.  She states  she is doing her exercises 1x/day.  She believes the prone quad stretch is helping.    Patient Stated Goals get the leg moving again; use the knee; bending and straightening knee    Currently in Pain? Yes    Pain Score 5     Pain Location Knee    Pain Orientation Right    Pain Descriptors / Indicators Aching    Aggravating Factors  working all day    Pain Relieving Factors ice              [facility] PT Assessment - 11/04/20 0001      Assessment   Medical Diagnosis Rt knee scope subchondroplasty with ORIF of medial sided plateau insufficiency fracture; medial and lateral meniscetomy     Referring Provider (PT) Dr Ramond Marrow     Onset Date/Surgical Date 10/09/20   pain since 04/06/20    Hand Dominance Left    Next MD Visit 11/16/20    Prior Therapy none       AROM   Right Knee Extension -6    Right Knee Flexion 130            [facility] Adult PT Treatment/Exercise - 11/04/20 0001      Self-Care   Self-Care Scar Mobilizations    Scar Mobilizations pt educated regarding scar mobilization; demo provided. Pt verbalized understanding.       Knee/Hip Exercises: Stretches  Passive Hamstring Stretch Right;30 seconds;3 reps   supine with strap   Passive Hamstring Stretch Limitations supine x 2, seated x 1     Quad Stretch Right;3 reps;30 seconds   towel roll above knee   Piriformis Stretch Right;Left;2 reps;20 seconds    Gastroc Stretch Right;2 reps;30 seconds   heel off of step     Knee/Hip Exercises: Aerobic   Nustep L3: legs only x 5 min for ROM.     Other Aerobic single laps around gym to assess response to exercises.       Knee/Hip Exercises: Standing   Stairs 4-6" steps with reciprocal pattern x 12 steps with bilat UE on rail.       Knee/Hip Exercises: Supine   Quad Sets Strengthening;Right;5 reps   5 sec hold    Heel Slides AAROM;Right;1 set;5 reps    Bridges 1 set;10 reps    Straight Leg Raises Strengthening;Right;5 reps   ~12 inch lift - 5 sec hold      Knee/Hip  Exercises: Prone   Prone Knee Hang 1 minute    Other Prone Exercises Rt TKE quad set x 5 sec x 5 reps      Modalities   Modalities --   deferred; will ice at home.                       PT Long Term Goals - 10/25/20 1240      PT LONG TERM GOAL #1   Title Improve AROM Rt knee to =/> than AROM Lt knee    Time 6    Period Weeks    Status New    Target Date 12/09/20      PT LONG TERM GOAL #2   Title 4+/5 to 5/5 strength Rt LE    Time 6    Period Weeks    Status New    Target Date 12/09/20      PT LONG TERM GOAL #3   Title normal gait pattern with increased standing and walking tolerance for work and ADL's with minimal to no pain Rt knee    Time 6    Period Weeks    Status New    Target Date 12/09/20      PT LONG TERM GOAL #4   Title Independent in HEP    Time 6    Period Weeks    Status New    Target Date 12/09/20      PT LONG TERM GOAL #5   Title Improve FOTO to </= 42% limitation    Time 6    Period Weeks    Status New    Target Date 12/09/20                 Plan - 11/04/20 1509    Clinical Impression Statement Good improvement in Rt knee ROM.  Encouraged pt to complete stretches on LLE as well, due to limited ext ROM.  She tolerated all exercises well, without increase in pain.  Progressing towards goals.    Stability/Clinical Decision Making Stable/Uncomplicated    Rehab Potential Good    PT Frequency 2x / week    PT Duration 6 weeks    PT Treatment/Interventions ADLs/Self Care Home Management;Aquatic Therapy;Cryotherapy;Electrical Stimulation;Iontophoresis 4mg /ml Dexamethasone;Moist Heat;Ultrasound;Gait training;Stair training;Functional mobility training;Therapeutic activities;Therapeutic exercise;Balance training;Neuromuscular re-education;Patient/family education;Manual techniques;Passive range of motion;Dry needling;Taping;Vasopneumatic Device    PT Next Visit Plan progress with ROM and strengthening; gait training; manual release  HS/quads/hip rotators as  indicated; modalities as indicated    PT Home Exercise Plan [id number]    Consulted and Agree with Plan of Care Patient           Patient will benefit from skilled therapeutic intervention in order to improve the following deficits and impairments:  Abnormal gait, Decreased range of motion, Difficulty walking, Decreased activity tolerance, Pain, Decreased balance, Impaired flexibility, Improper body mechanics, Decreased mobility, Decreased strength, Postural dysfunction  Visit Diagnosis: Acute pain of right knee  Chronic pain of right knee  Muscle weakness (generalized)  Other abnormalities of gait and mobility     Problem List Patient Active Problem List   Diagnosis Date Noted   Right knee pain 08/23/2020   Prediabetes 08/23/2020   Essential hypertension 08/23/2020   Anxiety 08/23/2020   Depression 08/23/2020   Asthma 08/23/2020   Hyperlipidemia, mixed 08/23/2020   Mayer Camel, PTA 11/04/20 5:05 PM  [facility] [place] [address] [place], [place], [address] Phone: [phone]   Fax:  [phone]  Name: Jasmine Mejia MRN: [id number] Date of Birth: 03/12/1966

## 2020-11-06 ENCOUNTER — Encounter: Payer: Self-pay | Admitting: Physical Therapy

## 2020-11-06 ENCOUNTER — Ambulatory Visit (INDEPENDENT_AMBULATORY_CARE_PROVIDER_SITE_OTHER): Payer: 59 | Admitting: Physical Therapy

## 2020-11-06 ENCOUNTER — Other Ambulatory Visit: Payer: Self-pay

## 2020-11-06 DIAGNOSIS — R2689 Other abnormalities of gait and mobility: Secondary | ICD-10-CM | POA: Diagnosis not present

## 2020-11-06 DIAGNOSIS — M6281 Muscle weakness (generalized): Secondary | ICD-10-CM | POA: Diagnosis not present

## 2020-11-06 DIAGNOSIS — G8929 Other chronic pain: Secondary | ICD-10-CM

## 2020-11-06 DIAGNOSIS — M25561 Pain in right knee: Secondary | ICD-10-CM

## 2020-11-06 NOTE — Therapy (Addendum)
[place], [place], [address] Phone: [phone]   Fax:  [phone]  Physical Therapy Treatment  Patient Details  Name: Jasmine Mejia MRN: [id number] Date of Birth: 03/10/1966 Referring Provider (PT): Dr Ophelia Charter    Encounter Date: 11/06/2020   PT End of Session - 11/06/20 1528    Visit Number 3    Number of Visits 12    Date for PT Re-Evaluation 12/09/20    PT Start Time 7371   pt arrived late   PT Stop Time 1600    PT Time Calculation (min) 39 min    Activity Tolerance Patient tolerated treatment well    Behavior During Therapy [facility] for tasks assessed/performed           Past Medical History:  Diagnosis Date  . Anxiety   . Arthritis   . Asthma   . Depression   . Hypertension     Past Surgical History:  Procedure Laterality Date  . APPENDECTOMY    . CHOLECYSTECTOMY    . CHONDROPLASTY Right 10/09/2020   Procedure: CHONDROPLASTY;  Surgeon: Hiram Gash, MD;  Location: [facility];  Service: Orthopedics;  Laterality: Right;  . KNEE ARTHROSCOPY WITH LATERAL MENISECTOMY Right 10/09/2020   Procedure: KNEE ARTHROSCOPY WITH LATERAL MENISECTOMY;  Surgeon: Hiram Gash, MD;  Location: [place];  Service: Orthopedics;  Laterality: Right;  . KNEE ARTHROSCOPY WITH MEDIAL MENISECTOMY Right 10/09/2020   Procedure: KNEE ARTHROSCOPY WITH MEDIAL MENISECTOMY;  Surgeon: Hiram Gash, MD;  Location: [place];  Service: Orthopedics;  Laterality: Right;  . KNEE ARTHROSCOPY WITH SUBCHONDROPLASTY Right 10/09/2020   Procedure: KNEE ARTHROSCOPY WITH SUBCHONDROPLASTY;  Surgeon: Hiram Gash, MD;  Location: [place];  Service: Orthopedics;  Laterality: Right;  . TUBAL LIGATION      There were no vitals filed for this visit.   Subjective Assessment - 11/06/20 1528    Subjective Pt reports she hurt her Lt leg (points to back of knee) at the end  of work day yesterday, unsure how she hurt it.  She was sent home today.  She has been using ice on back of Lt leg and Rt knee.    Patient Stated Goals get the leg moving again; use the knee; bending and straightening knee    Currently in Pain? Yes    Pain Score 3     Pain Location Knee    Pain Orientation Right    Pain Descriptors / Indicators Sore    Multiple Pain Sites Yes    Pain Score 7    Pain Location Leg   Lt lateral hamstring   Pain Descriptors / Indicators Sharp              [facility] PT Assessment - 11/06/20 0001      Assessment   Medical Diagnosis Rt knee scope subchondroplasty with ORIF of medial sided plateau insufficiency fracture; medial and lateral meniscetomy     Referring Provider (PT) Dr Ophelia Charter     Onset Date/Surgical Date 10/09/20   pain since 04/06/20    Hand Dominance Left    Next MD Visit 11/16/20    Prior Therapy none              [facility] Adult PT Treatment/Exercise - 11/06/20 0001      Ambulation/Gait   Ambulation/Gait Yes    Ambulation/Gait Assistance 6: Modified independent (Device/Increase time)    Ambulation Distance (  Feet) 160 Feet    Assistive device Rolling walker    Gait Pattern Step-through pattern    Ambulation Surface Level;Indoor    Gait Comments VC for upright posture, increased Rt heel strike and even weight shift.       Knee/Hip Exercises: Stretches   Passive Hamstring Stretch Right;Left;2 reps;30 seconds   seated with straight back and hip hinge   Quad Stretch Right;3 reps;30 seconds   towel roll above knee   Gastroc Stretch Right;Left;2 reps;20 seconds   runner's stretch   Gastroc Stretch Limitations heavy cues for form.     Other Knee/Hip Stretches L stretch at counter, ( like modified downward dog) x 4 reps, 5-10 sec hold.       Knee/Hip Exercises: Aerobic   Nustep L3: legs only x 6 min for ROM.       Knee/Hip Exercises: Standing   Heel Raises Both;1 set x  8 reps, with toe raises   Forward Step Up Right;1 set;10 reps;Hand  Hold: 2;Step Height: 6"    Functional Squat Limitations mini squat at sink x 5, tactile cues for hip hinge and VC for knee alignment.       Knee/Hip Exercises: Seated   Sit to Sand 1 set;5 reps;with UE support   Rt hand on NuStep, Lt hand on walker        Declined modalities; will ice at home.       PT Long Term Goals - 10/25/20 1240      PT LONG TERM GOAL #1   Title Improve AROM Rt knee to =/> than AROM Lt knee    Time 6    Period Weeks    Status New    Target Date 12/09/20      PT LONG TERM GOAL #2   Title 4+/5 to 5/5 strength Rt LE    Time 6    Period Weeks    Status New    Target Date 12/09/20      PT LONG TERM GOAL #3   Title normal gait pattern with increased standing and walking tolerance for work and ADL's with minimal to no pain Rt knee    Time 6    Period Weeks    Status New    Target Date 12/09/20      PT LONG TERM GOAL #4   Title Independent in HEP    Time 6    Period Weeks    Status New    Target Date 12/09/20      PT LONG TERM GOAL #5   Title Improve FOTO to </= 42% limitation    Time 6    Period Weeks    Status New    Target Date 12/09/20                 Plan - 11/06/20 1542    Clinical Impression Statement Pt has had episode of pain in Lt posterior knee.  Utilized RW during session for increased stability with gait in clinic, due to pt report of fear of LLE buckling. Pt reported slight reduction in LLE pain with exercises.  She was able to perform exercises leading with RLE with report of decrease in pain in Rt knee. Goals are ongoing.    Stability/Clinical Decision Making Stable/Uncomplicated    Rehab Potential Good    PT Frequency 2x / week    PT Duration 6 weeks    PT Treatment/Interventions ADLs/Self Care Home Management;Aquatic Therapy;Cryotherapy;Electrical Stimulation;Iontophoresis 65m/ml Dexamethasone;Moist Heat;Ultrasound;Gait training;Stair training;Functional  mobility training;Therapeutic activities;Therapeutic  exercise;Balance training;Neuromuscular re-education;Patient/family education;Manual techniques;Passive range of motion;Dry needling;Taping;Vasopneumatic Device    PT Next Visit Plan progress with ROM and strengthening; gait training; manual release HS/quads/hip rotators as indicated; modalities as indicated    PT Home Exercise Plan [id number]    Consulted and Agree with Plan of Care Patient           Patient will benefit from skilled therapeutic intervention in order to improve the following deficits and impairments:  Abnormal gait, Decreased range of motion, Difficulty walking, Decreased activity tolerance, Pain, Decreased balance, Impaired flexibility, Improper body mechanics, Decreased mobility, Decreased strength, Postural dysfunction  Visit Diagnosis: Acute pain of right knee  Chronic pain of right knee  Muscle weakness (generalized)  Other abnormalities of gait and mobility     Problem List Patient Active Problem List   Diagnosis Date Noted  . Right knee pain 08/23/2020  . Prediabetes 08/23/2020  . Essential hypertension 08/23/2020  . Anxiety 08/23/2020  . Depression 08/23/2020  . Asthma 08/23/2020  . Hyperlipidemia, mixed 08/23/2020   Kerin Perna, PTA 11/06/20 5:16 PM  [place], [place], [address] Phone: [phone]   Fax:  [phone]  Name: Jasmine Mejia MRN: [id number] Date of Birth: 1966/09/16  PHYSICAL THERAPY DISCHARGE SUMMARY  Visits from Start of Care: 3  Current functional level related to goals / functional outcomes: See last progress note for discharge status    Remaining deficits: Unknown    Education / Equipment: HEP Plan: Patient agrees to discharge.  Patient goals were partially met. Patient is being discharged due to not returning since the last visit.  ?????     Celyn P. Helene Kelp PT, MPH 01/23/21 4:16 PM

## 2020-11-11 ENCOUNTER — Encounter: Payer: 59 | Admitting: Physical Therapy

## 2020-11-14 ENCOUNTER — Telehealth: Payer: Self-pay | Admitting: Physical Therapy

## 2020-11-14 ENCOUNTER — Encounter: Payer: 59 | Admitting: Physical Therapy

## 2020-11-14 NOTE — Telephone Encounter (Signed)
Patient did not show for physical therapy appt.  Called patient, but no answer.  Left patient HIPAA compliant message requesting that she return phone call regarding upcoming appts, [phone].  Mayer Camel, PTA 11/14/20 3:57 PM

## 2020-11-18 ENCOUNTER — Encounter: Payer: 59 | Admitting: Physical Therapy

## 2020-11-21 ENCOUNTER — Encounter: Payer: 59 | Admitting: Physical Therapy

## 2020-12-02 ENCOUNTER — Ambulatory Visit: Payer: 59 | Admitting: Medical-Surgical

## 2020-12-03 ENCOUNTER — Other Ambulatory Visit: Payer: Self-pay

## 2020-12-03 ENCOUNTER — Ambulatory Visit (INDEPENDENT_AMBULATORY_CARE_PROVIDER_SITE_OTHER): Payer: 59 | Admitting: Medical-Surgical

## 2020-12-03 ENCOUNTER — Encounter: Payer: Self-pay | Admitting: Medical-Surgical

## 2020-12-03 VITALS — BP 130/87 | HR 77 | Temp 98.5°F | Ht 66.0 in | Wt 218.9 lb

## 2020-12-03 DIAGNOSIS — J45901 Unspecified asthma with (acute) exacerbation: Secondary | ICD-10-CM | POA: Diagnosis not present

## 2020-12-03 DIAGNOSIS — N951 Menopausal and female climacteric states: Secondary | ICD-10-CM

## 2020-12-03 DIAGNOSIS — F3341 Major depressive disorder, recurrent, in partial remission: Secondary | ICD-10-CM

## 2020-12-03 DIAGNOSIS — F419 Anxiety disorder, unspecified: Secondary | ICD-10-CM | POA: Diagnosis not present

## 2020-12-03 MED ORDER — AZITHROMYCIN 250 MG PO TABS: ORAL_TABLET | ORAL | 0 refills | Status: DC

## 2020-12-03 MED ORDER — VENLAFAXINE HCL ER 37.5 MG PO CP24: ORAL_CAPSULE | ORAL | 1 refills | Status: DC

## 2020-12-03 MED ORDER — SERTRALINE HCL 50 MG PO TABS: ORAL_TABLET | ORAL | 0 refills | Status: DC

## 2020-12-03 MED ORDER — ALBUTEROL SULFATE HFA 108 (90 BASE) MCG/ACT IN AERS: 2.0000 | INHALATION_SPRAY | Freq: Four times a day (QID) | RESPIRATORY_TRACT | 11 refills | Status: DC | PRN

## 2020-12-03 NOTE — Progress Notes (Signed)
Subjective:    CC: Discuss menopause  HPI: Pleasant 53 year old female presenting today to discuss menopause.  She has been having severe hot flashes occur approximately every hour at night.  She is currently working on sleeping with her ceiling fan on as well as a stand-up fan.  She has started sleeping naked because she cannot tolerate even the lightest pajamas.  Her last menstrual cycle was not quite 12 months ago so she has not crossed into menopause yet.  Wonders if there is any treatment that may help control her hot flashes so that she can sleep.  Would like me to listen to her chest today as she feels like she may have been wheezing.  She has been coughing for several weeks, productive of thick yellow sputum.  Does note a mild worsening of her shortness of breath.  She has had a bit of sinus pressure/sinus headache as well as postnasal drip over the last few weeks.  Denies fever/chills and chest pain.  She does not have an albuterol inhaler at home but notes that she used one in the past.  Continues to smoke approximately 1 pack/day but verbalizes that she intends to work on smoking cessation.  Anxiety/depression-taking Zoloft 100 mg twice daily, tolerating well.  Does not know if this medication is truly helpful for her or not that she has been on it for so long.  I reviewed the past medical history, family history, social history, surgical history, and allergies today and no changes were needed.  Please see the problem list section below in epic for further details.  Past Medical History: Past Medical History:  Diagnosis Date  . Anxiety   . Arthritis   . Asthma   . Depression   . Hypertension    Past Surgical History: Past Surgical History:  Procedure Laterality Date  . APPENDECTOMY    . CHOLECYSTECTOMY    . CHONDROPLASTY Right 10/09/2020   Procedure: CHONDROPLASTY;  Surgeon: Bjorn Pippin, MD;  Location: [facility];  Service: Orthopedics;  Laterality: Right;  .  KNEE ARTHROSCOPY WITH LATERAL MENISECTOMY Right 10/09/2020   Procedure: KNEE ARTHROSCOPY WITH LATERAL MENISECTOMY;  Surgeon: Bjorn Pippin, MD;  Location: [facility];  Service: Orthopedics;  Laterality: Right;  . KNEE ARTHROSCOPY WITH MEDIAL MENISECTOMY Right 10/09/2020   Procedure: KNEE ARTHROSCOPY WITH MEDIAL MENISECTOMY;  Surgeon: Bjorn Pippin, MD;  Location: [facility];  Service: Orthopedics;  Laterality: Right;  . KNEE ARTHROSCOPY WITH SUBCHONDROPLASTY Right 10/09/2020   Procedure: KNEE ARTHROSCOPY WITH SUBCHONDROPLASTY;  Surgeon: Bjorn Pippin, MD;  Location: [facility];  Service: Orthopedics;  Laterality: Right;  . TUBAL LIGATION     Social History: Social History   Socioeconomic History  . Marital status: Single    Spouse name: Not on file  . Number of children: Not on file  . Years of education: Not on file  . Highest education level: Not on file  Occupational History  . Not on file  Tobacco Use  . Smoking status: Current Every Day Smoker    Packs/day: 1.00    Years: 37.00    Pack years: 37.00    Types: Cigarettes  . Smokeless tobacco: Never Used  Vaping Use  . Vaping Use: Never used  Substance and Sexual Activity  . Alcohol use: Not Currently  . Drug use: Never  . Sexual activity: Yes    Partners: Male    Birth control/protection: Post-menopausal  Other Topics Concern  .  Not on file  Social History Narrative  . Not on file   Social Determinants of Health   Financial Resource Strain: Not on file  Food Insecurity: Not on file  Transportation Needs: Not on file  Physical Activity: Not on file  Stress: Not on file  Social Connections: Not on file   Family History: Family History  Problem Relation Age of Onset  . Hypertension Mother   . Heart attack Mother   . Diabetes Mother   . Hypertension Sister   . Heart attack Sister   . Diabetes Sister   . Hypertension Brother   . Heart attack Brother   . Diabetes  Brother   . Colitis Neg Hx   . Esophageal cancer Neg Hx   . Stomach cancer Neg Hx   . Rectal cancer Neg Hx    Allergies: No Known Allergies Medications: See med rec.  Review of Systems: See HPI for pertinent positives and negatives.   Objective:    General: Well Developed, well nourished, and in no acute distress.  Neuro: Alert and oriented x3.  HEENT: Normocephalic, atraumatic.  Skin: Warm and dry. Cardiac: Regular rate and rhythm, no murmurs rubs or gallops, no lower extremity edema.  Respiratory: Clear to auscultation bilaterally. Not using accessory muscles, speaking in full sentences.   Impression and Recommendations:    1. Anxiety/depression Switching over to Effexor XR from Zoloft.  We will cross taper to prevent a worsening of her anxiety/depression symptoms as well as withdrawal from Zoloft.  See new prescriptions for both medications for tapering instructions.  Will follow closely during this taper to make sure she is treated adequately to avoid withdrawal or exacerbation of her mood symptoms.  2. Menopausal syndrome (hot flashes) Switching from Zoloft to Effexor as this does have a notable effect on reducing hot flashes.  4. Asthma with acute exacerbation, unspecified asthma severity, unspecified whether persistent Sending in albuterol inhaler to use as needed for wheezing.  We will go ahead and treat with azithromycin for exacerbation.  Strongly recommend smoking cessation.  Return in about 2 weeks (around 12/17/2020) for mood/menopause follow up.  ___________________________________________ Thayer Ohm, DNP, APRN, FNP-BC Primary Care and Sports Medicine [facility] [place]

## 2020-12-03 NOTE — Patient Instructions (Signed)
Zoloft taper:  Take 150mg  daily for 4 days then Take 100mg  for 7 days then  Take 50mg   For 7 days then stop Zoloft

## 2020-12-09 ENCOUNTER — Other Ambulatory Visit: Payer: Self-pay

## 2020-12-09 ENCOUNTER — Ambulatory Visit (INDEPENDENT_AMBULATORY_CARE_PROVIDER_SITE_OTHER): Payer: 59

## 2020-12-09 ENCOUNTER — Ambulatory Visit (INDEPENDENT_AMBULATORY_CARE_PROVIDER_SITE_OTHER): Payer: 59 | Admitting: Sports Medicine

## 2020-12-09 DIAGNOSIS — M25562 Pain in left knee: Secondary | ICD-10-CM | POA: Diagnosis not present

## 2020-12-09 DIAGNOSIS — M25461 Effusion, right knee: Secondary | ICD-10-CM | POA: Insufficient documentation

## 2020-12-09 DIAGNOSIS — M25561 Pain in right knee: Secondary | ICD-10-CM

## 2020-12-09 DIAGNOSIS — M1711 Unilateral primary osteoarthritis, right knee: Secondary | ICD-10-CM | POA: Diagnosis not present

## 2020-12-09 DIAGNOSIS — G8929 Other chronic pain: Secondary | ICD-10-CM | POA: Diagnosis not present

## 2020-12-09 IMAGING — DX DG KNEE 1-2V*R*
2 series · 2 of 2 positions shown · non-contrast
Comparison: None.

CLINICAL DATA: Left knee pain.

EXAM:
RIGHT KNEE - 1-2 VIEW

[tunnel]
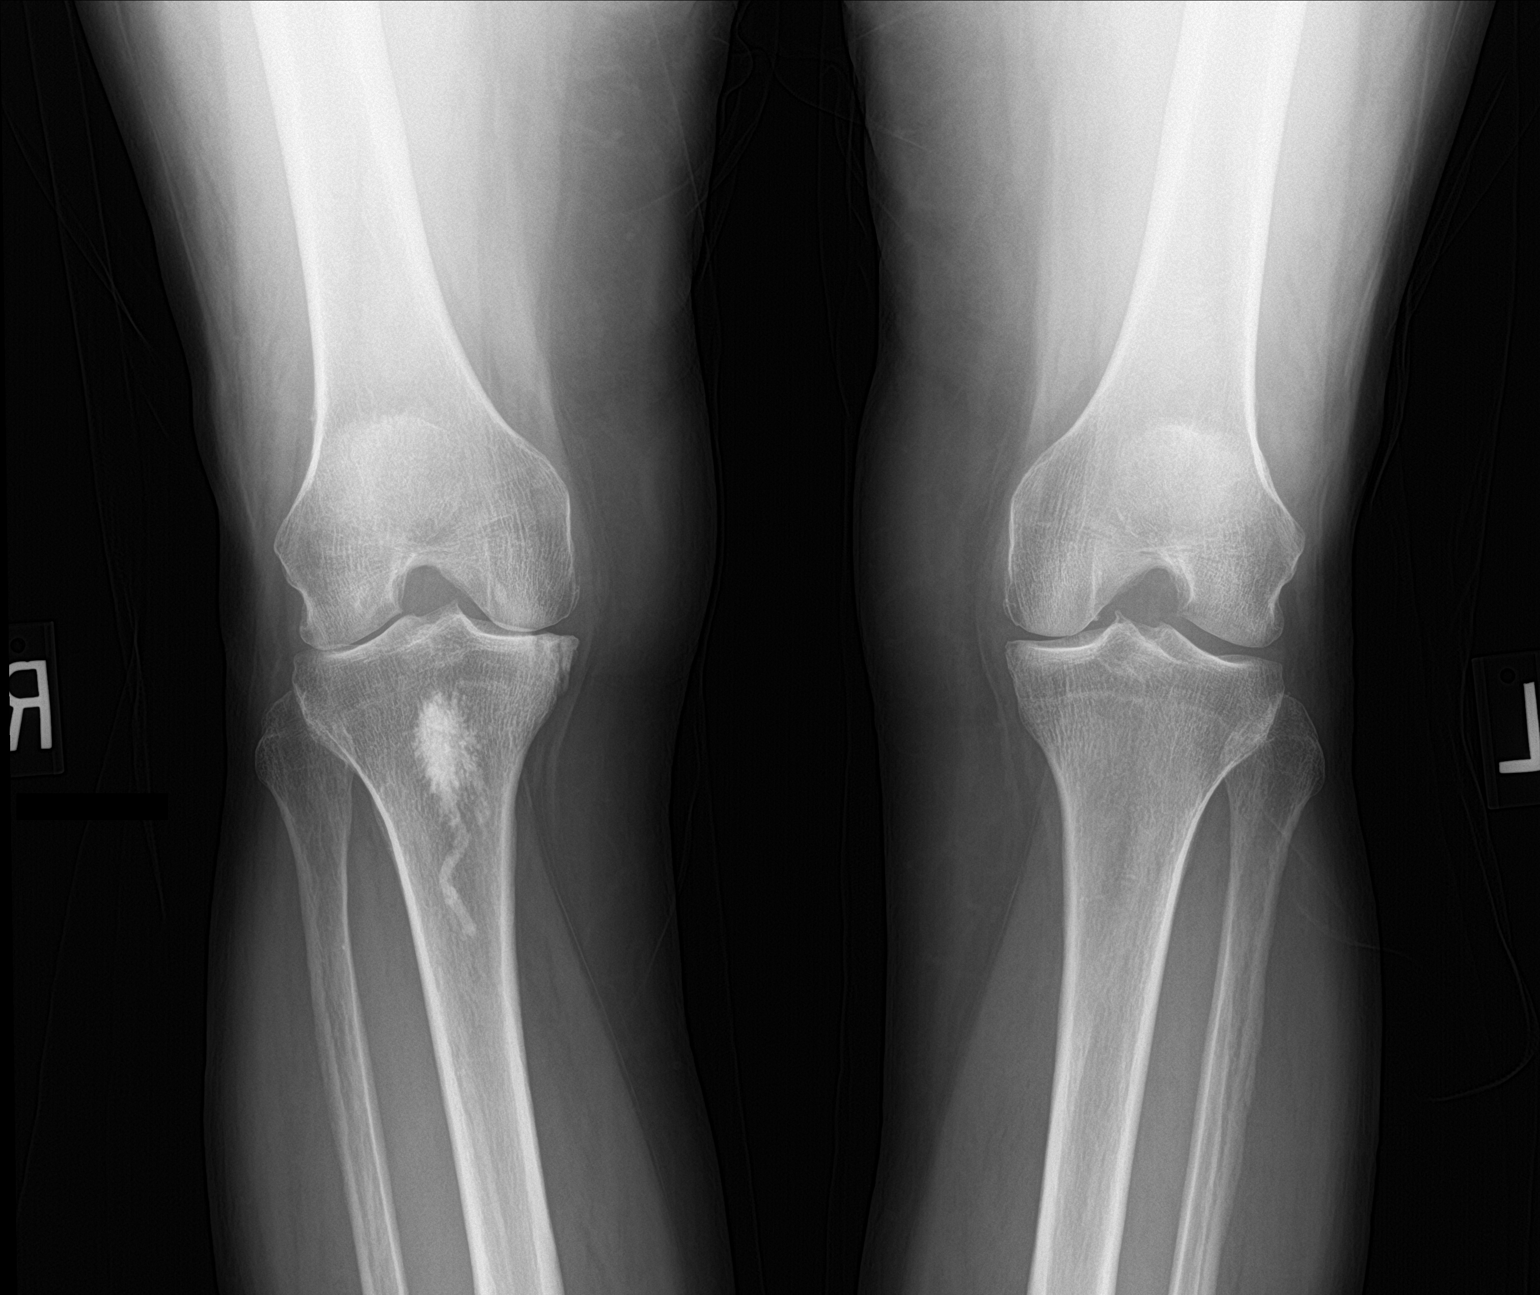

[knee ap bilat standing]
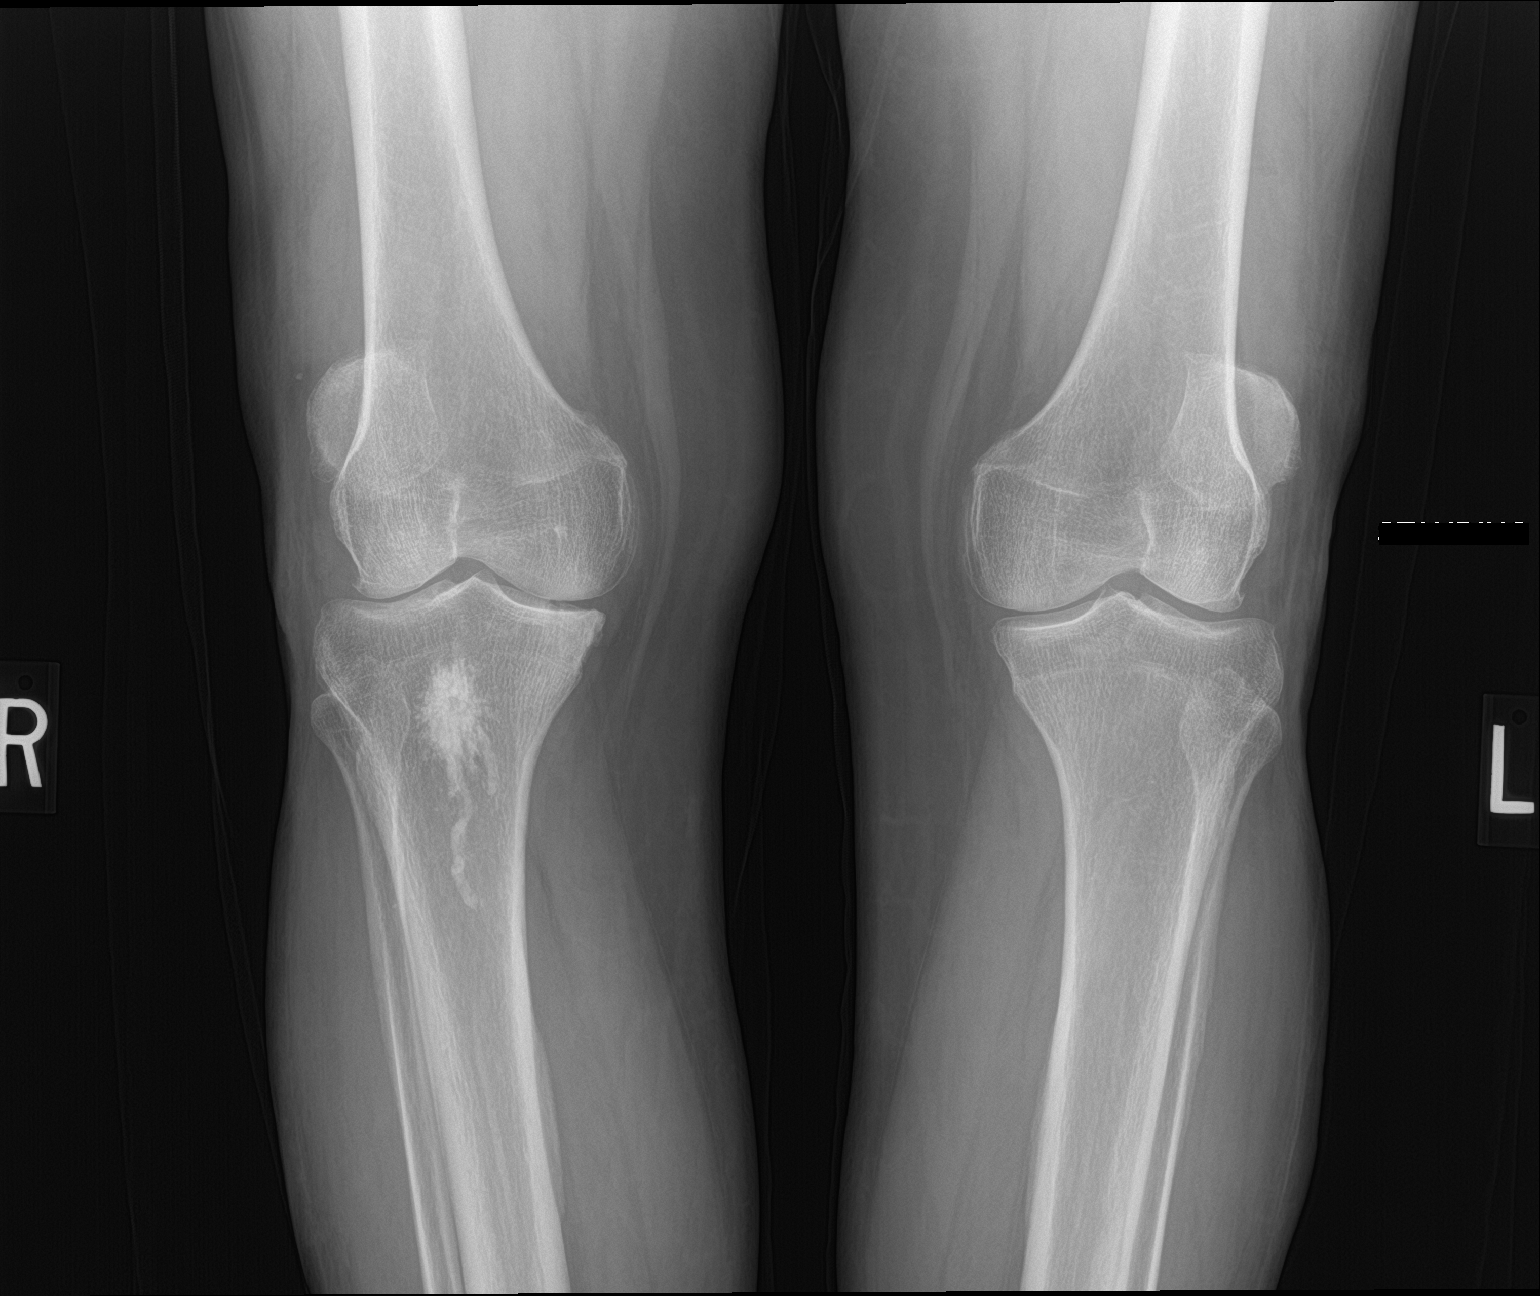

[2 of 2 positions shown; findings below may reference images not displayed]

FINDINGS: The patient appears to be status post subchondroplasty of the
proximal tibia due to probable old medial tibial plateau fracture.
Moderate degenerative change is seen involving the medial and
lateral joint spaces. No acute fracture is noted.
IMPRESSION: Status post subchondroplasty of proximal tibia due to probable old
medial tibial plateau fracture. Moderate degenerative joint disease.

## 2020-12-09 IMAGING — DX DG KNEE COMPLETE 4+V*L*
4 series · 4 of 4 positions shown · non-contrast
Comparison: None.

CLINICAL DATA: Acute left knee pain without known injury.

EXAM:
LEFT KNEE - COMPLETE 4+ VIEW

[tunnel]
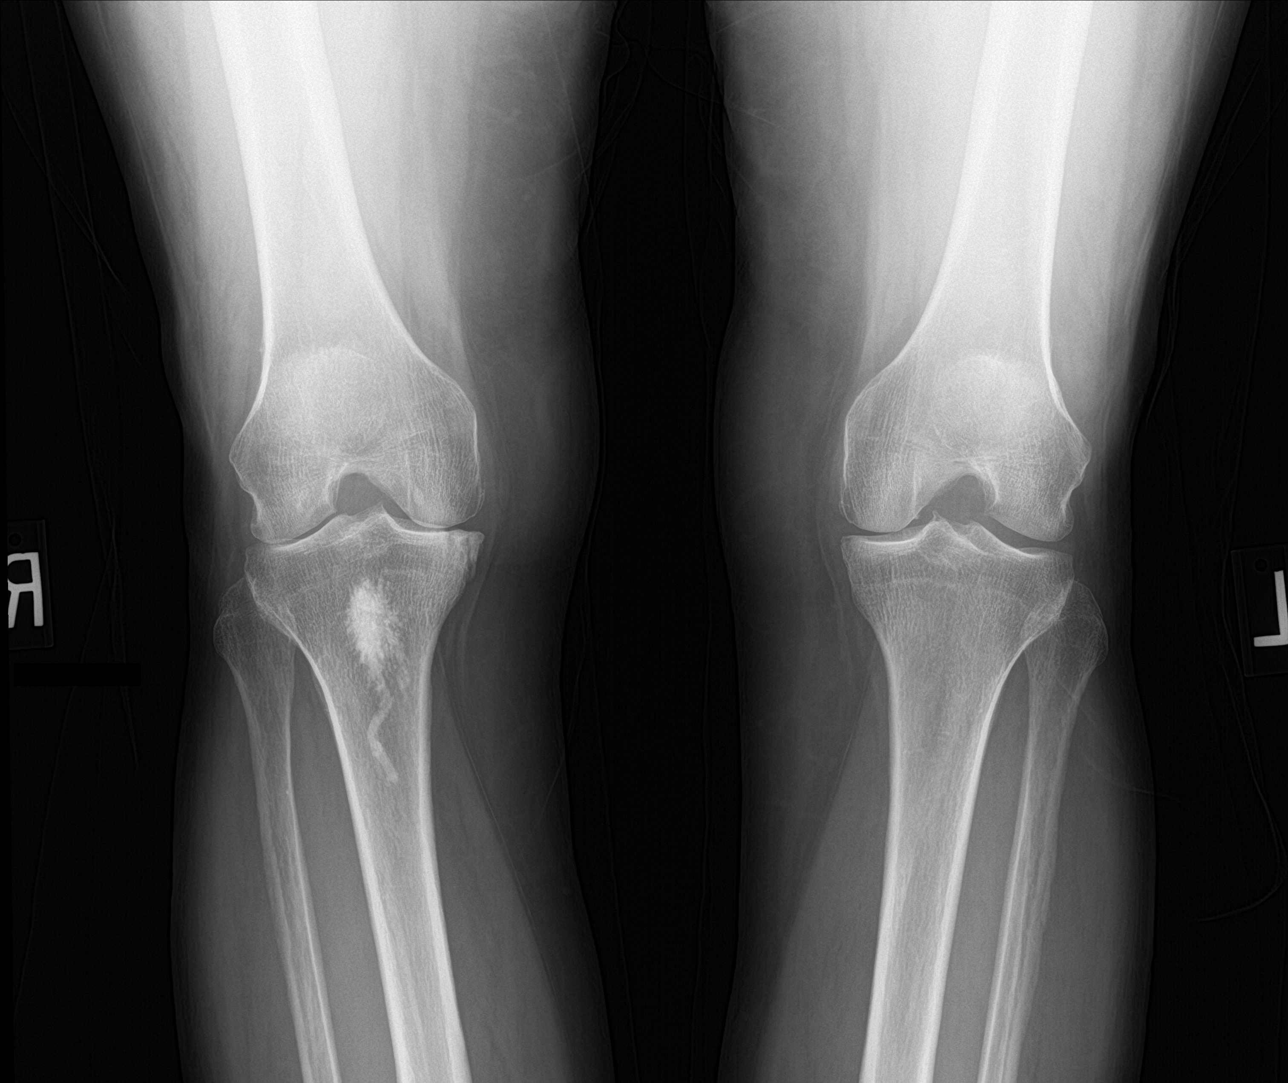

[knee lat]
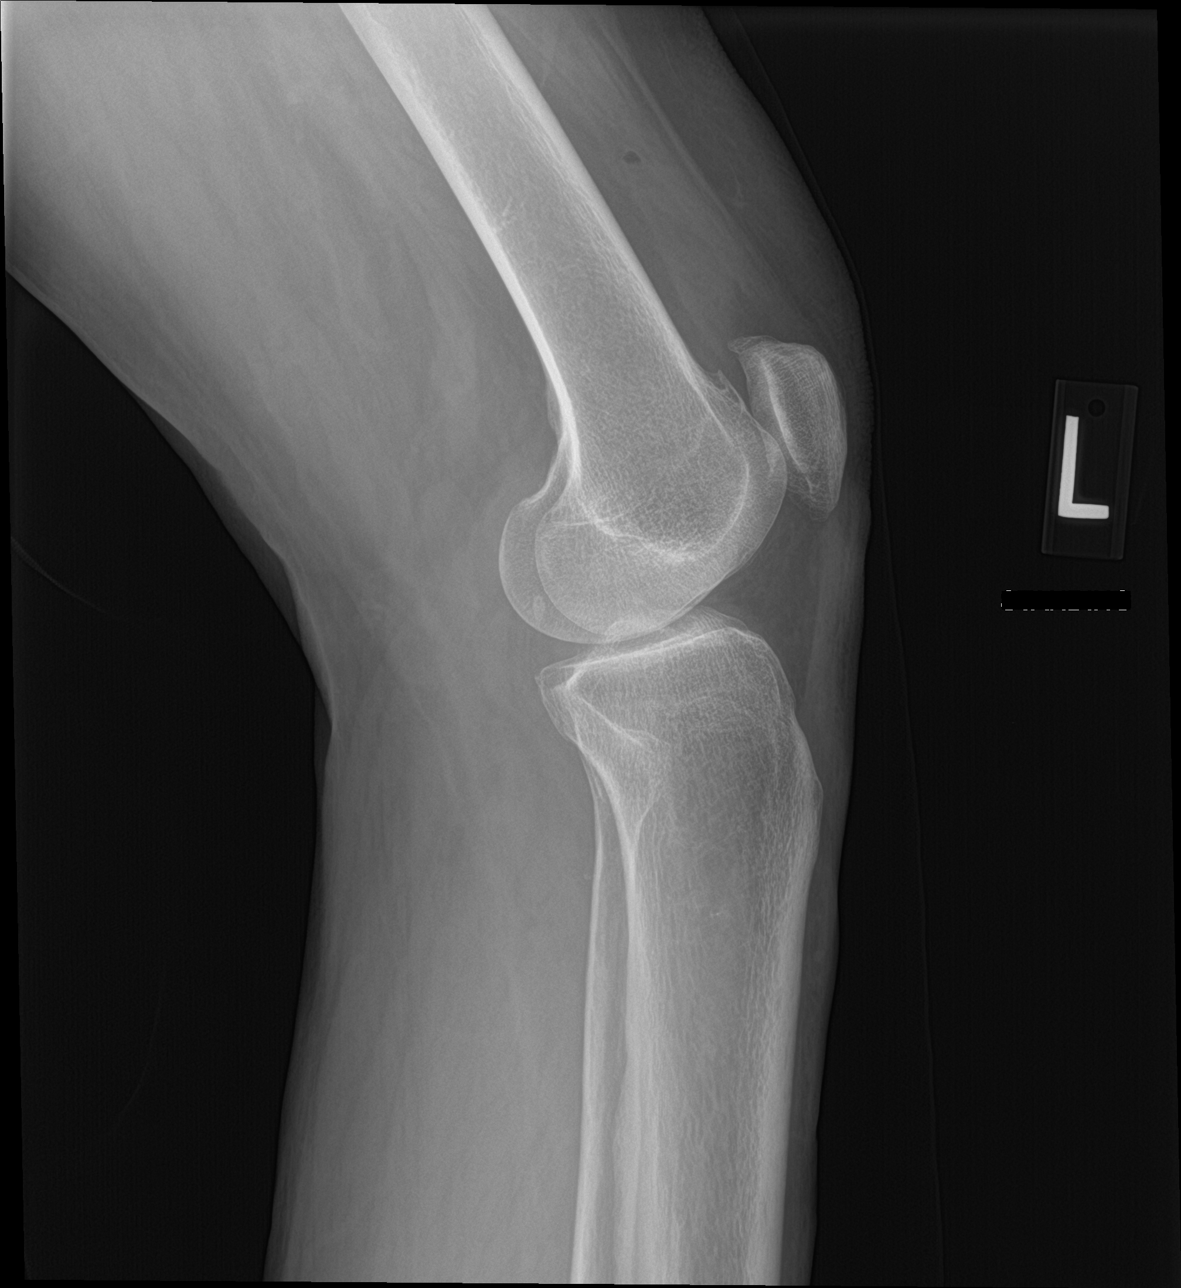

[knee sunrise]
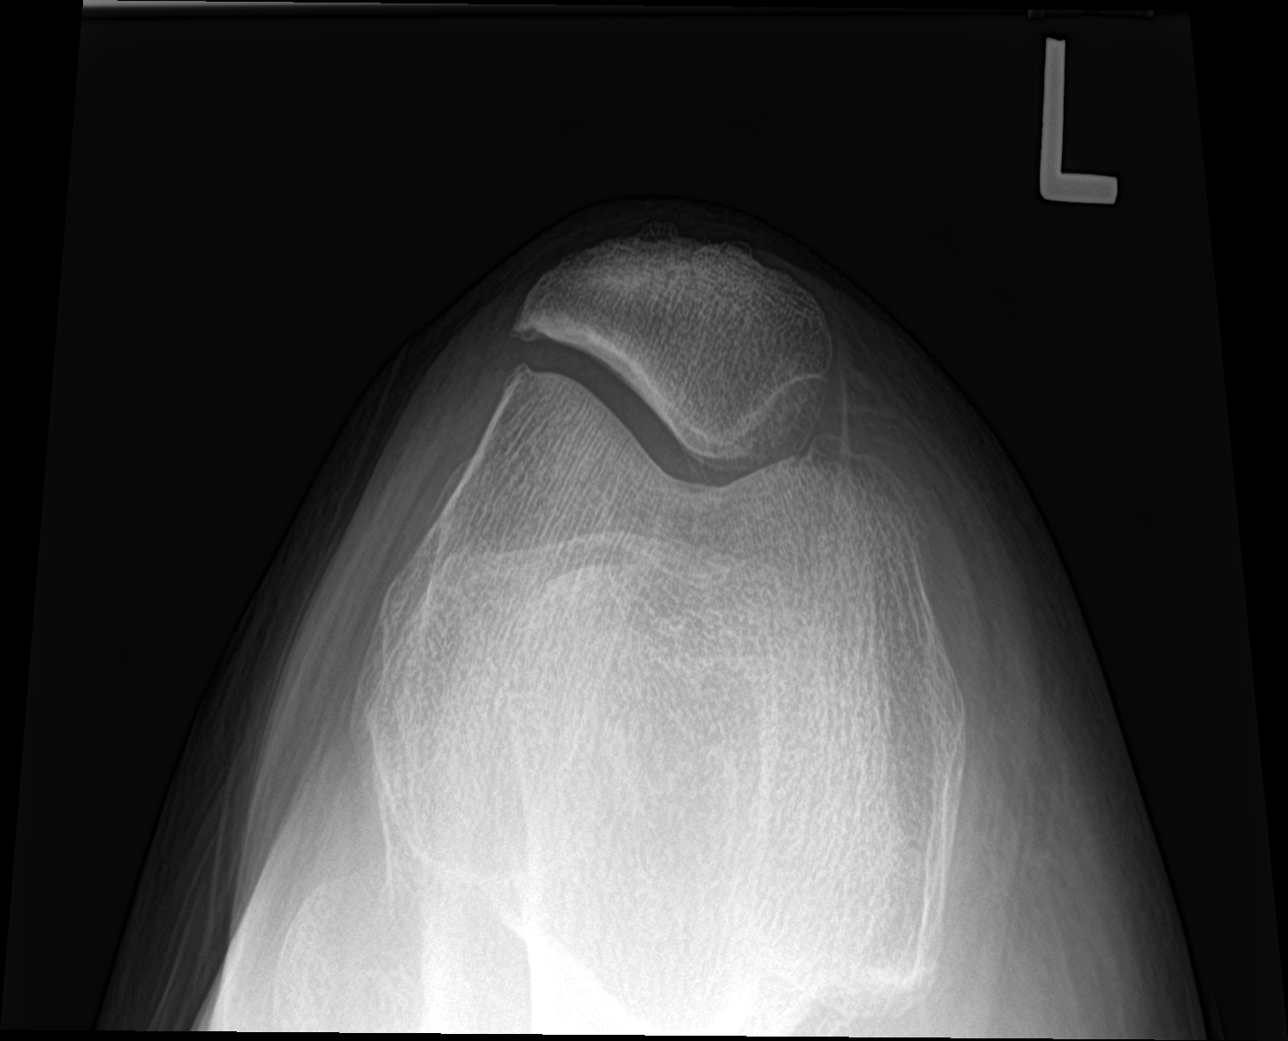

[knee ap bilat standing]
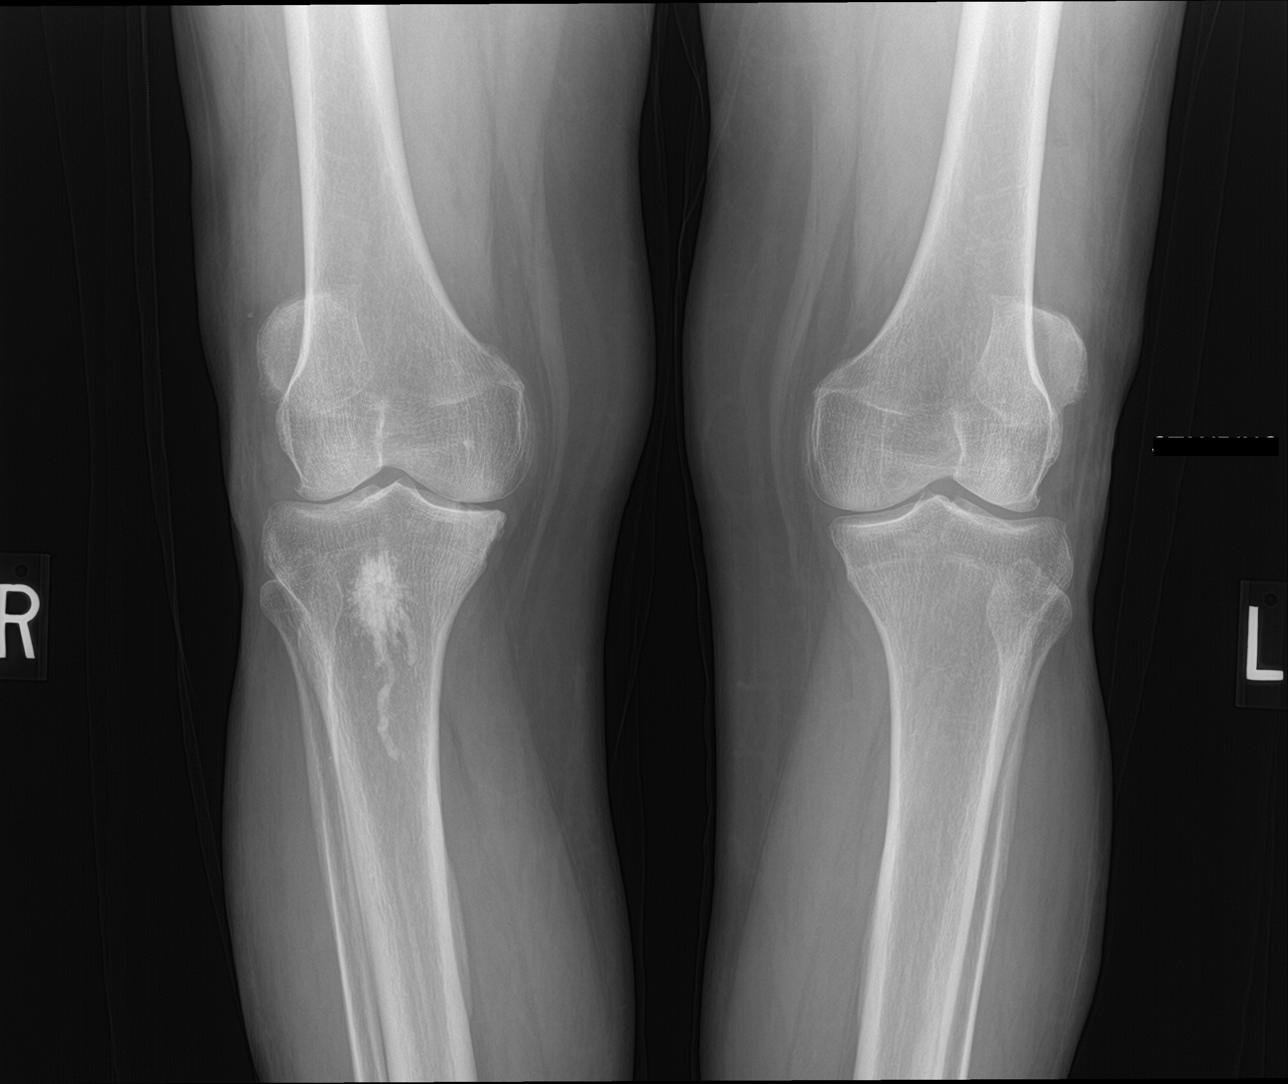

[4 of 4 positions shown; findings below may reference images not displayed]

FINDINGS: No evidence of fracture, dislocation, or joint effusion. No evidence
of arthropathy or other focal bone abnormality. Soft tissues are
unremarkable.
IMPRESSION: Negative.

## 2020-12-09 NOTE — Assessment & Plan Note (Signed)
Left-sided medial joint line pain, history of arthroscopy and meniscal tearing on the right. Adding some x-rays, injection, return to see me in 6 weeks, MRI if no better.

## 2020-12-09 NOTE — Assessment & Plan Note (Signed)
Doing much better now post arthroscopy with Dr. Everardo Pacific.

## 2020-12-09 NOTE — Progress Notes (Signed)
    Procedures performed today:    Procedure: Real-time Ultrasound Guided injection of the left knee Device: Samsung HS60  Verbal informed consent obtained.  Time-out conducted.  Noted no overlying erythema, induration, or other signs of local infection.  Skin prepped in a sterile fashion.  Local anesthesia: Topical Ethyl chloride.  With sterile technique and under real time ultrasound guidance: 1 cc Kenalog 40, 2 cc lidocaine, 2 cc bupivacaine injected easily Completed without difficulty  Advised to call if fevers/chills, erythema, induration, drainage, or persistent bleeding.  Images permanently stored and available for review in PACS.  Impression: Technically successful ultrasound guided injection.  Independent interpretation of notes and tests performed by another provider:   None.  Brief History, Exam, Impression, and Recommendations:    Right knee pain Doing much better now post arthroscopy with Dr. Everardo Pacific.  Acute pain of left knee Left-sided medial joint line pain, history of arthroscopy and meniscal tearing on the right. Adding some x-rays, injection, return to see me in 6 weeks, MRI if no better.    ___________________________________________ Ihor Austin. Benjamin Stain, M.D., ABFM., CAQSM. Primary Care and Sports Medicine [facility]  Adjunct Instructor of Family Medicine  [employer]

## 2020-12-10 MED ORDER — HYDROCODONE-ACETAMINOPHEN 10-325 MG PO TABS: 1.0000 | ORAL_TABLET | Freq: Three times a day (TID) | ORAL | 0 refills | Status: DC | PRN

## 2020-12-10 NOTE — Addendum Note (Signed)
Addended by: Monica Becton on: 12/10/2020 11:12 AM   Modules accepted: Orders

## 2020-12-17 ENCOUNTER — Telehealth (INDEPENDENT_AMBULATORY_CARE_PROVIDER_SITE_OTHER): Payer: 59 | Admitting: Medical-Surgical

## 2020-12-17 ENCOUNTER — Encounter: Payer: Self-pay | Admitting: Medical-Surgical

## 2020-12-17 DIAGNOSIS — N951 Menopausal and female climacteric states: Secondary | ICD-10-CM

## 2020-12-17 DIAGNOSIS — F419 Anxiety disorder, unspecified: Secondary | ICD-10-CM | POA: Diagnosis not present

## 2020-12-17 DIAGNOSIS — F3341 Major depressive disorder, recurrent, in partial remission: Secondary | ICD-10-CM

## 2020-12-17 MED ORDER — VENLAFAXINE HCL ER 150 MG PO CP24: 150.0000 mg | ORAL_CAPSULE | Freq: Every day | ORAL | 1 refills | Status: DC

## 2020-12-17 NOTE — Progress Notes (Signed)
Virtual Visit via Video Note  I connected with Jasmine Mejia on 12/17/20 at 11:10 AM EST by a video enabled telemedicine application and verified that I am speaking with the correct person using two identifiers.   I discussed the limitations of evaluation and management by telemedicine and the availability of in person appointments. The patient expressed understanding and agreed to proceed.  Patient location: home Provider locations: office  Subjective:    CC: mood/menopause follow up  HPI: Pleasant 55 year old female presenting via MyChart video visit for follow up on mood/menopause. Was seen 2 weeks ago and instructed to start Effexor and stop Zoloft using a cross-taper. She is doing well with this and has a few days left before completely stopping the Zoloft. Tolerating the Effexor well and is currently at 75mg  daily. Feels that we need to increase the Effexor just a bit. Still having some hot flashes but they are not as frequent since switching medications. Sleeping well. No other complaints and no signs of withdrawal from Zoloft. Denies SI/HI.   Past medical history, Surgical history, Family history not pertinant except as noted below, Social history, Allergies, and medications have been entered into the medical record, reviewed, and corrections made.   Review of Systems: See HPI for pertinent positives and negatives.   Depression screen [facility] 2/9 12/17/2020 08/20/2020  Decreased Interest 0 0  Down, Depressed, Hopeless 0 0  PHQ - 2 Score 0 0  Altered sleeping 0 0  Tired, decreased energy 1 2  Change in appetite 0 2  Feeling bad or failure about yourself  0 0  Trouble concentrating 0 1  Moving slowly or fidgety/restless 0 0  Suicidal thoughts 0 0  PHQ-9 Score 1 5  Difficult doing work/chores Not difficult at all Somewhat difficult   GAD 7 : Generalized Anxiety Score 12/17/2020 08/20/2020  Nervous, Anxious, on Edge 0 0  Control/stop worrying 0 3  Worry too much - different things 1  3  Trouble relaxing 0 0  Restless 0 0  Easily annoyed or irritable 0 3  Afraid - awful might happen 0 0  Total GAD 7 Score 1 9  Anxiety Difficulty Not difficult at all Not difficult at all     Objective:    General: Speaking clearly in complete sentences without any shortness of breath.  Alert and oriented x3.  Normal judgment. No apparent acute distress.  Impression and Recommendations:    1. Menopausal syndrome/depression/anxiety Continue taper as instructed. Once Zoloft has been completed, increase Effexor to 3 capsules per day until supply is exhausted. Sending a new prescription to the pharmacy for Effexor 150mg  daily. If any side effects or intolerance at the new dose, advised her to contact me.   I discussed the assessment and treatment plan with the patient. The patient was provided an opportunity to ask questions and all were answered. The patient agreed with the plan and demonstrated an understanding of the instructions.   The patient was advised to call back or seek an in-person evaluation if the symptoms worsen or if the condition fails to improve as anticipated.  20 minutes of non-face-to-face time was provided during this encounter.  Return in about 3 months (around 03/17/2021) for mood follow up.  , DNP, APRN, FNP-BC [facility]

## 2020-12-29 ENCOUNTER — Other Ambulatory Visit: Payer: Self-pay | Admitting: Sports Medicine

## 2020-12-29 DIAGNOSIS — G8929 Other chronic pain: Secondary | ICD-10-CM

## 2020-12-30 ENCOUNTER — Ambulatory Visit (INDEPENDENT_AMBULATORY_CARE_PROVIDER_SITE_OTHER): Payer: 59

## 2020-12-30 ENCOUNTER — Encounter: Payer: Self-pay | Admitting: Nurse Practitioner

## 2020-12-30 ENCOUNTER — Other Ambulatory Visit: Payer: Self-pay

## 2020-12-30 ENCOUNTER — Telehealth (INDEPENDENT_AMBULATORY_CARE_PROVIDER_SITE_OTHER): Payer: 59 | Admitting: Nurse Practitioner

## 2020-12-30 VITALS — Temp 100.2°F

## 2020-12-30 DIAGNOSIS — R5082 Postprocedural fever: Secondary | ICD-10-CM | POA: Diagnosis not present

## 2020-12-30 DIAGNOSIS — R2241 Localized swelling, mass and lump, right lower limb: Secondary | ICD-10-CM | POA: Diagnosis not present

## 2020-12-30 DIAGNOSIS — M79661 Pain in right lower leg: Secondary | ICD-10-CM

## 2020-12-30 DIAGNOSIS — R509 Fever, unspecified: Secondary | ICD-10-CM

## 2020-12-30 DIAGNOSIS — H938X3 Other specified disorders of ear, bilateral: Secondary | ICD-10-CM

## 2020-12-30 DIAGNOSIS — M7989 Other specified soft tissue disorders: Secondary | ICD-10-CM | POA: Diagnosis not present

## 2020-12-30 DIAGNOSIS — J029 Acute pharyngitis, unspecified: Secondary | ICD-10-CM

## 2020-12-30 IMAGING — US US EXTREM LOW VENOUS*R*
1 series · 13 of 24 positions shown · non-contrast
Comparison: None.

CLINICAL DATA: Right leg pain and swelling, history of recent knee
surgery



[Series 1: us extrem low venous*right* · 0.09mm/px · 13 of 46 slices shown]
[im 1/46]
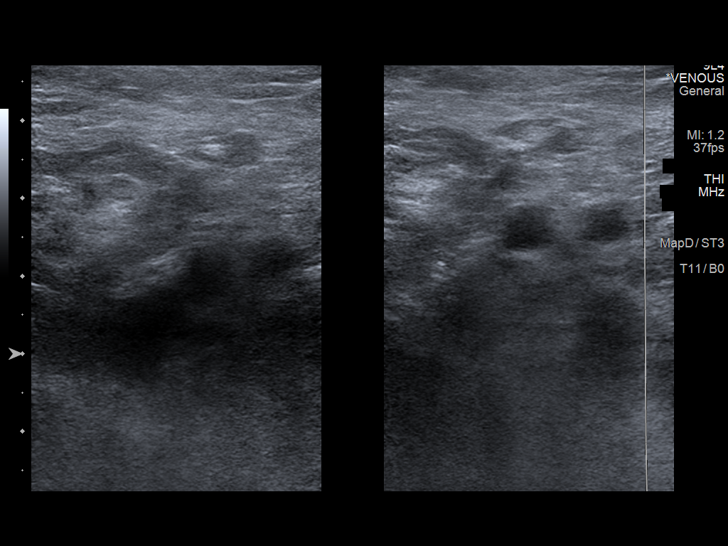
[im 4/46]
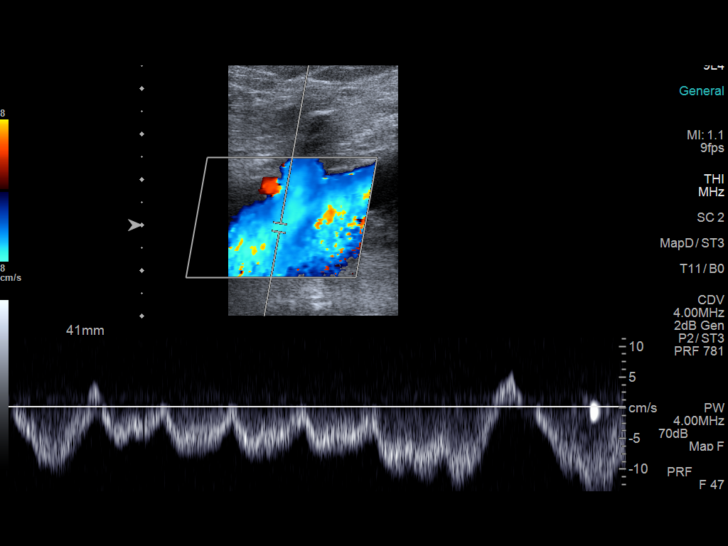
[im 8/46]
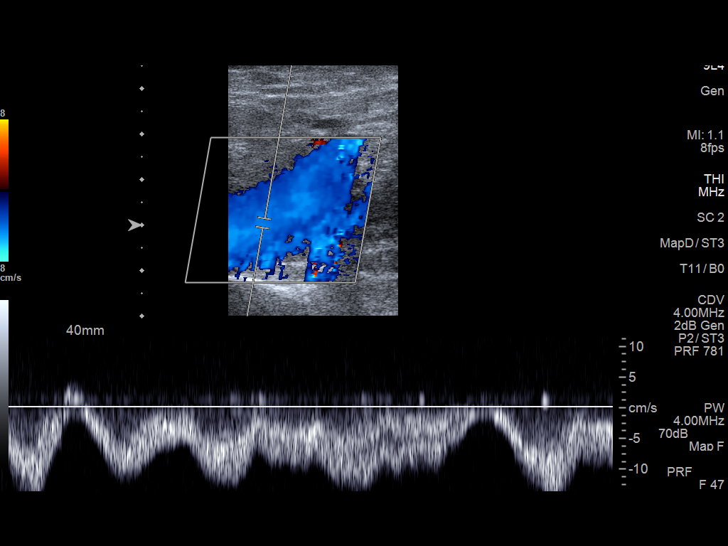
[im 12/46]
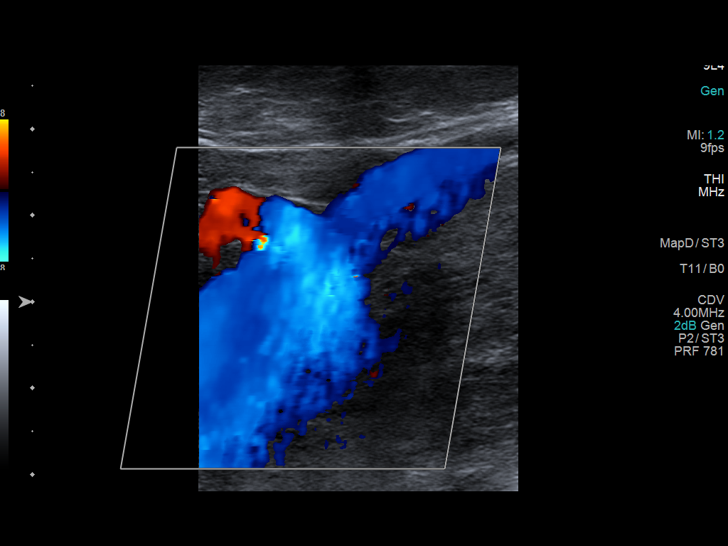
[im 16/46]
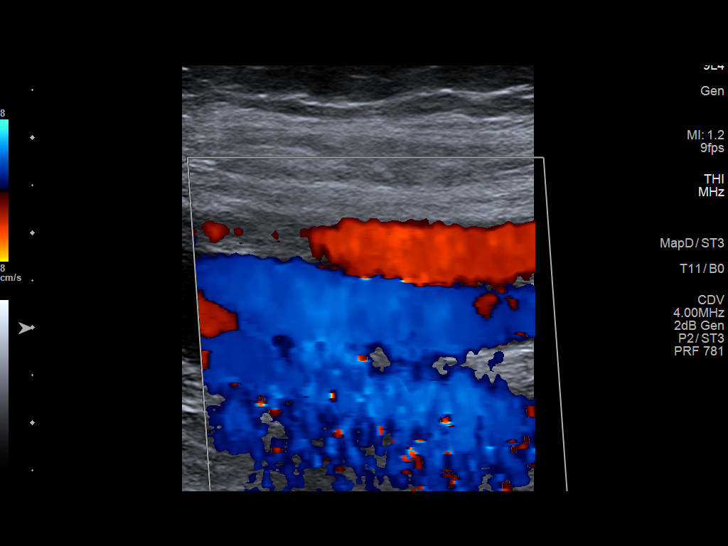
[im 20/46]
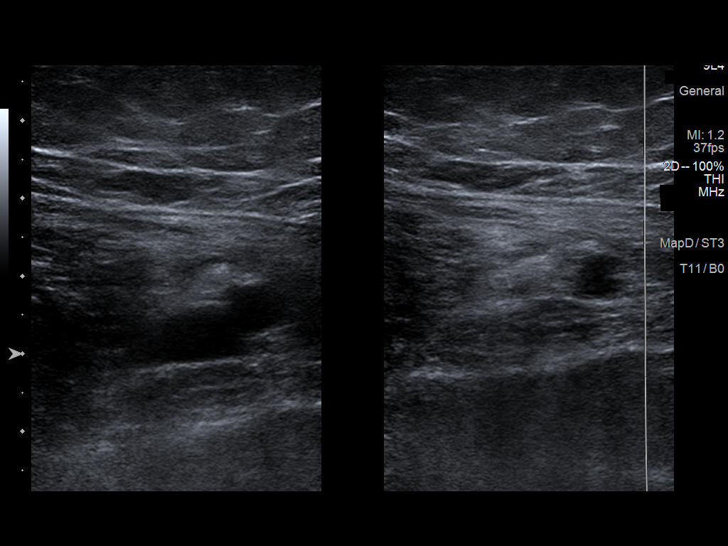
[im 24/46]
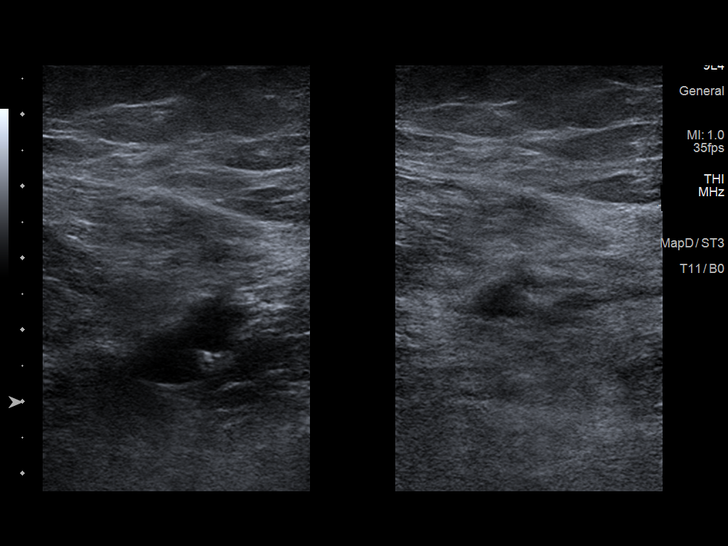
[im 26/46]
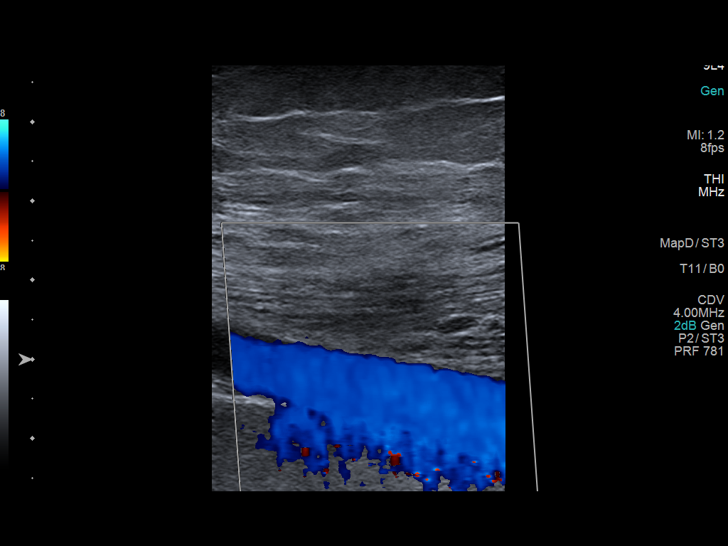
[im 30/46]
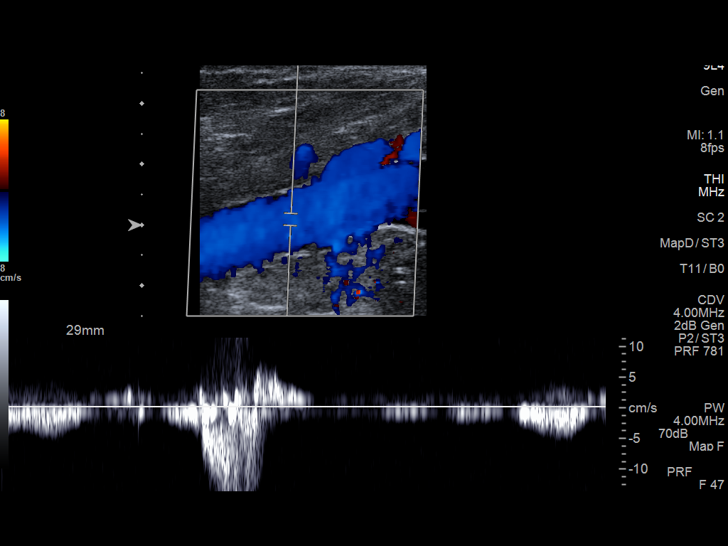
[im 34/46]
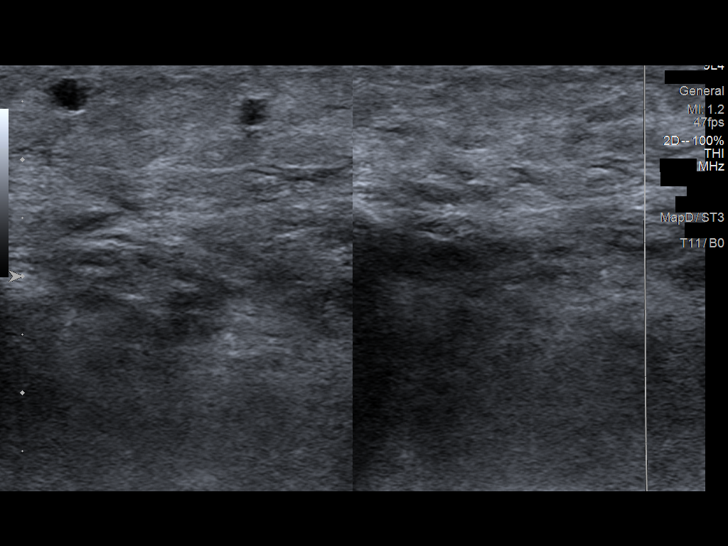
[im 38/46]
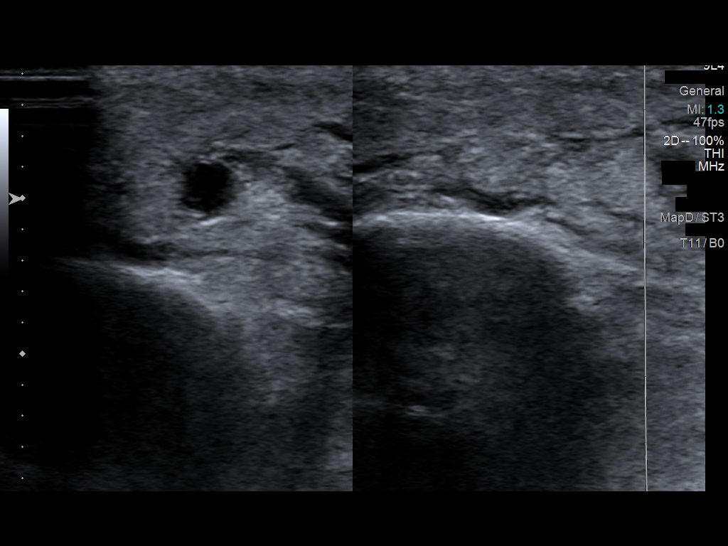
[im 42/46]
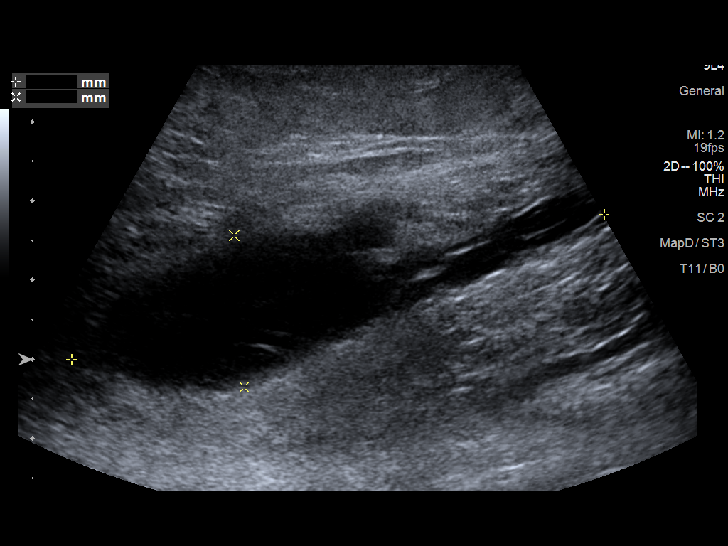
[im 46/46]
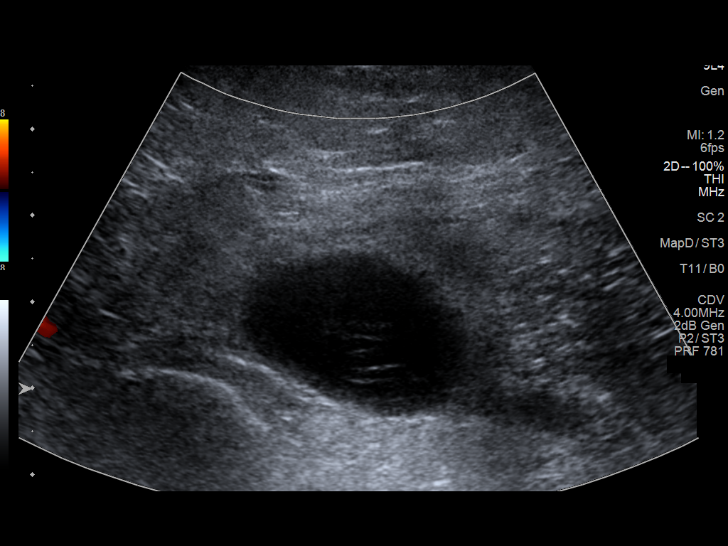

[13 of 24 positions shown; findings below may reference images not displayed]

FINDINGS: Contralateral Common Femoral Vein: Respiratory phasicity is normal
and symmetric with the symptomatic side. No evidence of thrombus.
Normal compressibility.

Common Femoral Vein: No evidence of thrombus. Normal
compressibility, respiratory phasicity and response to augmentation.

Saphenofemoral Junction: No evidence of thrombus. Normal
compressibility and flow on color Doppler imaging.

Profunda Femoral Vein: No evidence of thrombus. Normal
compressibility and flow on color Doppler imaging.

Femoral Vein: No evidence of thrombus. Normal compressibility,
respiratory phasicity and response to augmentation.

Popliteal Vein: No evidence of thrombus. Normal compressibility,
respiratory phasicity and response to augmentation.

Calf Veins: No evidence of thrombus. Normal compressibility and flow
on color Doppler imaging.

Superficial Great Saphenous Vein: No evidence of thrombus. Normal
compressibility.

Venous Reflux:  None.

Other Findings:  Popliteal cyst is noted measuring 7.0 x 2.9 cm.
IMPRESSION: No evidence of deep venous thrombosis.

Popliteal cyst as described.

## 2020-12-30 MED ORDER — AMOXICILLIN-POT CLAVULANATE 875-125 MG PO TABS: 1.0000 | ORAL_TABLET | Freq: Two times a day (BID) | ORAL | 0 refills | Status: DC

## 2020-12-30 NOTE — Patient Instructions (Signed)
We are going to run a test today to see if you may have a blood clot in your leg that could be causing the swelling and pain. I have put information on this below.   I will let you know what the results show and we will make a decision on how to treat this once we get the results. The test will be done at [facility] on [address].   If you start experiencing chest pain, shortness of breath, dizziness, or confusion- please go to the emergency room immediately.    Deep Vein Thrombosis  Deep vein thrombosis (DVT) is a condition in which a blood clot forms in a deep vein, such as a vein in the lower leg, thigh, pelvis, or arm. Deep veins are veins in the deep venous system. A clot is blood that has thickened into a gel or solid. This condition is serious and can be life-threatening if the clot travels to the lungs and causes a blockage (pulmonary embolism) in the arteries of the lung. A DVT can also damage veins in the leg. This can lead to long-term, or chronic, venous disease, leg pain, swelling, discoloration, and ulcers or sores (post-thrombotic syndrome). What are the causes? This condition may be caused by:  A slowdown of blood flow.  Damage to a vein.  A condition that causes blood to clot more easily, such as certain blood-clotting disorders. What increases the risk? The following factors may make you more likely to develop this condition:  Having obesity.  Being older, especially older than age 28.  Being inactive (sedentary lifestyle) or not moving around. This may include: ? Sitting or lying down for longer than 4-6 hours other than to sleep at night. ? Being in the hospital, having major or lengthy surgery, or having a thin, flexible tube (central line catheter) placed in a large vein.  Being pregnant, giving birth, or having recently given birth.  Taking medicines that contain estrogen, such as birth control or hormone replacement  therapy.  Using products that contain nicotine or tobacco, especially if you use hormonal birth control.  Having a history of blood clots or a blood-clotting disease, a blood vessel disease (peripheral vascular disease), or congestive heart disease.  Having a history of cancer, especially if being treated with chemotherapy. What are the signs or symptoms? Symptoms of this condition include:  Swelling, pain, pressure, or tenderness in an arm or a leg.  An arm or a leg becoming warm, red, or discolored.  A leg turning very pale. You may have a large DVT. This is rare. If the clot is in your leg, you may notice symptoms more or have worse symptoms when you stand or walk. In some cases, there are no symptoms. How is this diagnosed? This condition is diagnosed with:  Your medical history and a physical exam.  Tests, such as: ? Blood tests to check how well your blood clots. ? Doppler ultrasound. This is the best way to find a DVT. ? Venogram. Contrast dye is injected into a vein, and X-rays are taken to check for clots. How is this treated? Treatment for this condition depends on:  The cause of your DVT.  The size and location of your DVT, or having more than one DVT.  Your risk for bleeding or developing more clots.  Other medical conditions you may have. Treatment may include:  Taking a blood thinner, also called an anticoagulant, to prevent clots from forming and  growing.  Wearing compression stockings, if directed.  Injecting medicines into the affected vein to break up the clot (catheter-directed thrombolysis). This is used only for severe DVT and only if a specialist recommends it.  Specific surgical procedures, when DVT is severe or hard to treat. These may be done to: ? Isolate and remove your clot. ? Place an inferior vena cava (IVC) filter in a large vein to catch blood clots before they reach your lungs. You may get some medical treatments for 6 months or  longer. Follow these instructions at home: If you are taking blood thinners:  Talk with your health care provider before you take any medicines that contain aspirin or NSAIDs, such as ibuprofen. These medicines increase your risk for dangerous bleeding.  Take your medicine exactly as told, at the same time every day. Do not skip a dose. Do not take more than the prescribed dose. This is important.  Ask your health care provider about foods and medicines that could change the way your blood thinner works (may interact). Avoid these foods and medicines if you are told to do so.  Avoid anything that may cause bleeding or bruising. You may bleed more easily while taking blood thinners. ? Be very careful when using knives, scissors, or other sharp objects. ? Use an electric razor instead of a blade. ? Avoid activities that could cause injury or bruising, and follow instructions for preventing falls. ? Tell your health care provider if you have had any internal bleeding, bleeding ulcers, or neurologic diseases, such as strokes or cerebral aneurysms.  Wear a medical alert bracelet or carry a card that lists what medicines you take. General instructions  Take over-the-counter and prescription medicines only as told by your health care provider.  Return to your normal activities as told by your health care provider. Ask your health care provider what activities are safe for you.  If recommended, wear compression stockings as told by your health care provider. These stockings help to prevent blood clots and reduce swelling in your legs.  Keep all follow-up visits as told by your health care provider. This is important. Contact a health care provider if:  You miss a dose of your blood thinner.  You have new or worse pain, swelling, or redness in an arm or a leg.  You have worsening numbness or tingling in an arm or a leg.  You have unusual bruising. Get help right away if:  You have signs  or symptoms that a blood clot has moved to the lungs. These may include: ? Shortness of breath. ? Chest pain. ? Fast or irregular heartbeats (palpitations). ? Light-headedness or dizziness. ? Coughing up blood.  You have signs or symptoms that your blood is too thin. These may include: ? Blood in your vomit, stool, or urine. ? A cut that will not stop bleeding. ? A menstrual period that is heavier than usual. ? A severe headache or confusion. These symptoms may represent a serious problem that is an emergency. Do not wait to see if the symptoms will go away. Get medical help right away. Call your local emergency services (911 in the [place] Do not drive yourself to the hospital. Summary  Deep vein thrombosis (DVT) happens when a blood clot forms in a deep vein. This may occur in the lower leg, thigh, pelvis, or arm.  Symptoms affect the arm or leg and can include swelling, pain, tenderness, warmth, redness, or discoloration.  This condition may be treated  with medicines or compression stockings. In severe cases, surgery may be done.  If you are taking blood thinners, take them exactly as told. Do not skip a dose. Do not take more than is prescribed.  Get help right away if you have shortness of breath, chest pain, fast or irregular heartbeats, or blood in your vomit, urine, or stool. This information is not intended to replace advice given to you by your health care provider. Make sure you discuss any questions you have with your health care provider. Document Revised: 11/18/2019 Document Reviewed: 11/18/2019 Elsevier Patient Education  2021 [employer].

## 2020-12-30 NOTE — Addendum Note (Signed)
Addended by: Larosa Rhines, Huntley Dec E on: 12/30/2020 06:05 PM   Modules accepted: Orders, Level of Service

## 2020-12-30 NOTE — Progress Notes (Signed)
Spoke with patient on phone- No DVT present. Bakers cyst is evident from [place]. Recommend follow-up with Dr. Karie Schwalbe for evaluation and treatment. In meantime recommend ice, compression, elevation, and rest to reduce inflammation and pain.

## 2020-12-30 NOTE — Addendum Note (Signed)
Addended by: Addisynn Vassell, Huntley Dec E on: 12/30/2020 03:54 PM   Modules accepted: Orders

## 2020-12-30 NOTE — Progress Notes (Addendum)
Virtual Video Visit via MyChart Note  I connected with  Jasmine Mejia on 12/30/20 at  2:10 PM EST by the video enabled telemedicine application for , MyChart, and verified that I am speaking with the correct person using two identifiers.   I introduced myself as a Publishing rights manager with the practice. We discussed the limitations of evaluation and management by telemedicine and the availability of in person appointments. The patient expressed understanding and agreed to proceed.  Participating parties in this visit include: The patient and the nurse practitioner listed and nurse practitioner Hyman Hopes observing.  The patient is: At home I am: In the office  Subjective:    CC:  Chief Complaint  Patient presents with  . Fever    HPI: Jasmine Mejia is a 55 y.o. year old female presenting today via MyChart today for fevers for a week. She has had two negative COVID tests. She is also experiencing congestion, ear pain and pressure, and sore throat, but no cough. 100-102 fevers for a week (last Tuesday).   She also reports that her right leg and ankle are swollen and painful in the calf. She had surgery on the right leg about 2 months ago.   She denies shortness of breath, chest pain, or dizziness.   Past medical history, Surgical history, Family history not pertinant except as noted below, Social history, Allergies, and medications have been entered into the medical record, reviewed, and corrections made.   Review of Systems:  All review of systems negative except what is listed in the HPI  Objective:    General:  Speaking clearly in complete sentences. Absent shortness of breath noted.   Alert and oriented x3.   Normal judgment.  Absent acute distress. Congestion is present.   Impression and Recommendations:    1. Right calf pain 2. Swelling of calf 3. Post-procedural fever Swelling and pain in lower extremity with symptoms consistent for DVT. Will send for stat doppler US  of right LE and labs for Ddimer and CBC. Given smoking history and recent surgery, her RF are elevated for DVT.  Will make changes to plan of care based on results.  Addendum: Imaging negative for DVT- bakers cyst is present- called patient and encouraged an appointment with sports medicine or orthopedics for evaluation and possible drainage or injection. In the meantime, encouraged elevation of extremity, ice, and compression to help with pain and swelling.   4. Fever, unspecified fever cause 5. Sore throat 6. Congestion of both ears Suspected ear infection. Will send augmentin.  Follow-up if symptoms worsen or fail to improve.  - amoxicillin-clavulanate (AUGMENTIN) 875-125 MG tablet; Take 1 tablet by mouth 2 (two) times daily.  Dispense: 10 tablet; Refill: 0    Follow-up if symptoms worsen or fail to improve.    I discussed the assessment and treatment plan with the patient. The patient was provided an opportunity to ask questions and all were answered. The patient agreed with the plan and demonstrated an understanding of the instructions.   The patient was advised to call back or seek an in-person evaluation if the symptoms worsen or if the condition fails to improve as anticipated.  I provided 20 minutes of non-face-to-face interaction with this MYCHART visit including intake, same-day documentation, and chart review.   Tollie Eth, NP

## 2020-12-30 NOTE — Progress Notes (Signed)
Pt.stated been having a fever for a week  took PCR on 1/18 rapid on 1/21 its was negative Congestion Leg,ankle hurts and  swollen

## 2020-12-31 LAB — CBC WITH DIFFERENTIAL/PLATELET
Absolute Monocytes: 446 cells/uL (ref 200–950)
Basophils Absolute: 50 cells/uL (ref 0–200)
Basophils Relative: 0.5 %
Eosinophils Absolute: 119 cells/uL (ref 15–500)
Eosinophils Relative: 1.2 %
HCT: 42.3 % (ref 35.0–45.0)
Hemoglobin: 14.3 g/dL (ref 11.7–15.5)
Lymphs Abs: 2891 cells/uL (ref 850–3900)
MCH: 29.8 pg (ref 27.0–33.0)
MCHC: 33.8 g/dL (ref 32.0–36.0)
MCV: 88.1 fL (ref 80.0–100.0)
MPV: 9.6 fL (ref 7.5–12.5)
Monocytes Relative: 4.5 %
Neutro Abs: 6395 cells/uL (ref 1500–7800)
Neutrophils Relative %: 64.6 %
Platelets: 356 10*3/uL (ref 140–400)
RBC: 4.8 10*6/uL (ref 3.80–5.10)
RDW: 12.7 % (ref 11.0–15.0)
Total Lymphocyte: 29.2 %
WBC: 9.9 10*3/uL (ref 3.8–10.8)

## 2021-01-01 NOTE — Progress Notes (Signed)
Labs look good. Your white blood counts are a little higher than normal but not in the abnormal range, this is likely related to the infection and the antibiotic should help clear this. I hope you are feeling better!

## 2021-01-20 ENCOUNTER — Ambulatory Visit (INDEPENDENT_AMBULATORY_CARE_PROVIDER_SITE_OTHER): Payer: 59 | Admitting: Sports Medicine

## 2021-01-20 ENCOUNTER — Ambulatory Visit (INDEPENDENT_AMBULATORY_CARE_PROVIDER_SITE_OTHER): Payer: 59

## 2021-01-20 ENCOUNTER — Other Ambulatory Visit: Payer: Self-pay

## 2021-01-20 DIAGNOSIS — M25561 Pain in right knee: Secondary | ICD-10-CM

## 2021-01-20 DIAGNOSIS — G8929 Other chronic pain: Secondary | ICD-10-CM | POA: Diagnosis not present

## 2021-01-20 NOTE — Progress Notes (Signed)
    Procedures performed today:    Procedure: Real-time Ultrasound Guided  aspiration/injection of of right knee Baker's cyst Device: Samsung HS60  Verbal informed consent obtained.  Time-out conducted.  Noted no overlying erythema, induration, or other signs of local infection.  Skin prepped in a sterile fashion.  Local anesthesia: Topical Ethyl chloride.  With sterile technique and under real time ultrasound guidance:  Noted Baker's cyst.  I advanced an 18-gauge needle into the Baker's cyst and aspirated approximately 5 mL of clear, straw-colored fluid, syringe switched and 1 cc kenalog 40, 1 cc lidocaine injected easily.  Completed without difficulty  Advised to call if fevers/chills, erythema, induration, drainage, or persistent bleeding.  Images permanently stored and available for review in PACS.  Impression: Technically successful ultrasound guided injection.  Independent interpretation of notes and tests performed by another provider:   None.  Brief History, Exam, Impression, and Recommendations:    Right knee pain [name] recently developed some swelling and pain in the back of her right calf, she had a DVT ultrasound appropriately ordered that simply revealed a popliteal cyst. We did an aspiration and injection today, return to see me in 1 month.    ___________________________________________ Ihor Austin. Benjamin Stain, M.D., ABFM., CAQSM. Primary Care and Sports Medicine [facility]  Adjunct Instructor of Family Medicine  [employer]

## 2021-01-20 NOTE — Assessment & Plan Note (Signed)
Jasmine Mejia recently developed some swelling and pain in the back of her right calf, she had a DVT ultrasound appropriately ordered that simply revealed a popliteal cyst. We did an aspiration and injection today, return to see me in 1 month.

## 2021-01-21 ENCOUNTER — Telehealth: Payer: Self-pay | Admitting: Medical-Surgical

## 2021-01-21 ENCOUNTER — Ambulatory Visit: Payer: 59 | Admitting: Medical-Surgical

## 2021-01-21 NOTE — Telephone Encounter (Signed)
Calee called at 9;04 to cancel, because work will not allow her to leave early. She rescheduled for Friday. 01/24/2021.

## 2021-01-21 NOTE — Telephone Encounter (Signed)
Ok. Thank you. Please do not count as a no show/late cancellation.

## 2021-01-24 ENCOUNTER — Other Ambulatory Visit: Payer: Self-pay

## 2021-01-24 ENCOUNTER — Encounter: Payer: Self-pay | Admitting: Medical-Surgical

## 2021-01-24 ENCOUNTER — Ambulatory Visit (INDEPENDENT_AMBULATORY_CARE_PROVIDER_SITE_OTHER): Payer: 59 | Admitting: Medical-Surgical

## 2021-01-24 ENCOUNTER — Other Ambulatory Visit: Payer: Self-pay | Admitting: Medical-Surgical

## 2021-01-24 ENCOUNTER — Other Ambulatory Visit (HOSPITAL_COMMUNITY)
Admission: RE | Admit: 2021-01-24 | Discharge: 2021-01-24 | Disposition: A | Discharge status: Home / Self Care | Payer: 59 | Source: Ambulatory Visit | Attending: Medical-Surgical | Admitting: Medical-Surgical

## 2021-01-24 ENCOUNTER — Inpatient Hospital Stay (HOSPITAL_COMMUNITY): Admit: 2021-01-24 | Payer: 59

## 2021-01-24 VITALS — BP 123/84 | HR 76 | Temp 98.5°F | Ht 66.0 in | Wt 219.5 lb

## 2021-01-24 DIAGNOSIS — N926 Irregular menstruation, unspecified: Secondary | ICD-10-CM | POA: Insufficient documentation

## 2021-01-24 DIAGNOSIS — N950 Postmenopausal bleeding: Secondary | ICD-10-CM

## 2021-01-24 LAB — POCT URINALYSIS DIP (CLINITEK)
Glucose, UA: NEGATIVE mg/dL
Ketones, POC UA: NEGATIVE mg/dL
Leukocytes, UA: NEGATIVE
Nitrite, UA: NEGATIVE
POC PROTEIN,UA: 30 — AB
Spec Grav, UA: >=1.03 — AB (ref 1.010–1.025)
Urobilinogen, UA: 0.2 E.U./dL
pH, UA: 5 (ref 5.0–8.0)

## 2021-01-24 LAB — WET PREP FOR TRICH, YEAST, CLUE
MICRO NUMBER:: 11552391
Specimen Quality: ADEQUATE

## 2021-01-24 MED ORDER — FLUCONAZOLE 150 MG PO TABS: 150.0000 mg | ORAL_TABLET | Freq: Once | ORAL | 0 refills | Status: AC

## 2021-01-24 MED ORDER — METRONIDAZOLE 500 MG PO TABS: 500.0000 mg | ORAL_TABLET | Freq: Two times a day (BID) | ORAL | 0 refills | Status: DC

## 2021-01-24 NOTE — Progress Notes (Signed)
Subjective:    CC: vaginal bleeding  HPI: Pleasant 55 year old female presenting with complaints of vaginal bleeding on a daily basis for at least the last 2 weeks. Notes bleeding is very small and not enough to require a pad or tampon. No menstrual cycles for as least 12 months. Not currently sexually active. Last pap smear 8 years ago with abnormal resort but can't remember what it resulted as. Occasional thin white vaginal discharge but no odors or itching. Occasional intermittent nausea. Intermittent weak urinary stream. Denies fever, chills, abdominal/pelvic pain, dysuria, and constipation/diarrhea.  Mood has been much better since the switch of antidepressants. Also notes that she is sleeping better and her hot flashes have improved.   I reviewed the past medical history, family history, social history, surgical history, and allergies today and no changes were needed.  Please see the problem list section below in epic for further details.  Past Medical History: Past Medical History:  Diagnosis Date  . Anxiety   . Arthritis   . Asthma   . Depression   . Hypertension    Past Surgical History: Past Surgical History:  Procedure Laterality Date  . APPENDECTOMY    . CHOLECYSTECTOMY    . CHONDROPLASTY Right 10/09/2020   Procedure: CHONDROPLASTY;  Surgeon: Bjorn Pippin, MD;  Location: [facility];  Service: Orthopedics;  Laterality: Right;  . KNEE ARTHROSCOPY WITH LATERAL MENISECTOMY Right 10/09/2020   Procedure: KNEE ARTHROSCOPY WITH LATERAL MENISECTOMY;  Surgeon: Bjorn Pippin, MD;  Location: [facility];  Service: Orthopedics;  Laterality: Right;  . KNEE ARTHROSCOPY WITH MEDIAL MENISECTOMY Right 10/09/2020   Procedure: KNEE ARTHROSCOPY WITH MEDIAL MENISECTOMY;  Surgeon: Bjorn Pippin, MD;  Location: [facility];  Service: Orthopedics;  Laterality: Right;  . KNEE ARTHROSCOPY WITH SUBCHONDROPLASTY Right 10/09/2020   Procedure: KNEE ARTHROSCOPY  WITH SUBCHONDROPLASTY;  Surgeon: Bjorn Pippin, MD;  Location: [facility];  Service: Orthopedics;  Laterality: Right;  . TUBAL LIGATION     Social History: Social History   Socioeconomic History  . Marital status: Single    Spouse name: Not on file  . Number of children: Not on file  . Years of education: Not on file  . Highest education level: Not on file  Occupational History  . Not on file  Tobacco Use  . Smoking status: Current Every Day Smoker    Packs/day: 1.00    Years: 37.00    Pack years: 37.00    Types: Cigarettes  . Smokeless tobacco: Never Used  Vaping Use  . Vaping Use: Never used  Substance and Sexual Activity  . Alcohol use: Not Currently  . Drug use: Never  . Sexual activity: Yes    Partners: Male    Birth control/protection: Post-menopausal  Other Topics Concern  . Not on file  Social History Narrative  . Not on file   Social Determinants of Health   Financial Resource Strain: Not on file  Food Insecurity: Not on file  Transportation Needs: Not on file  Physical Activity: Not on file  Stress: Not on file  Social Connections: Not on file   Family History: Family History  Problem Relation Age of Onset  . Hypertension Mother   . Heart attack Mother   . Diabetes Mother   . Hypertension Sister   . Heart attack Sister   . Diabetes Sister   . Hypertension Brother   . Heart attack Brother   . Diabetes Brother   .  Colitis Neg Hx   . Esophageal cancer Neg Hx   . Stomach cancer Neg Hx   . Rectal cancer Neg Hx    Allergies: No Known Allergies Medications: See med rec.  Review of Systems: See HPI for pertinent positives and negatives.   Objective:    General: Well Developed, well nourished, and in no acute distress.  Neuro: Alert and oriented x3.  HEENT: Normocephalic, atraumatic.  Skin: Warm and dry. Cardiac: Regular rate and rhythm, no murmurs rubs or gallops, no lower extremity edema.  Respiratory: Clear to auscultation  bilaterally. Not using accessory muscles, speaking in full sentences. Pelvic exam: VULVA: normal appearing vulva with no masses, tenderness or lesions, VAGINA: PELVIC FLOOR EXAM: anterior vaginal wall protrusion consistent with cystocele, vaginal discharge - scant, CERVIX: normal appearing cervix without discharge or lesions, UTERUS: uterus is normal size, shape, consistency and nontender, ADNEXA: normal adnexa in size, nontender and no masses, PAP: Pap smear done today, HPV test, WET MOUNT done - results: clue cells, hyphae, exam chaperoned by Ihor Gully, MA.  Impression and Recommendations:    1. Post-menopausal bleeding Updating pap smear today with HPV cotesting. Wet prep + for yeast and BV. Treating with diflucan and Metronidazole. POCT UA + blood, protein, and bilirubin. Sending for culture. Referring to OBGYN for evaluation of anterior vaginal wall protrusion and evaluation of post menopausal bleeding.  - Cytology - PAP - WET PREP FOR TRICH, YEAST, CLUE - POCT URINALYSIS DIP (CLINITEK) - Urine Culture - Ambulatory referral to Obstetrics / Gynecology  Return if symptoms worsen or fail to improve.  ___________________________________________ Thayer Ohm, DNP, APRN, FNP-BC Primary Care and Sports Medicine [facility]

## 2021-01-26 LAB — URINE CULTURE
MICRO NUMBER:: 11554700
Result:: NO GROWTH
SPECIMEN QUALITY:: ADEQUATE

## 2021-01-27 ENCOUNTER — Encounter: Payer: Self-pay | Admitting: Medical-Surgical

## 2021-01-28 LAB — CYTOLOGY - PAP
Comment: NEGATIVE
Diagnosis: NEGATIVE
High risk HPV: NEGATIVE

## 2021-02-13 ENCOUNTER — Ambulatory Visit (INDEPENDENT_AMBULATORY_CARE_PROVIDER_SITE_OTHER): Payer: 59 | Admitting: Obstetrics and Gynecology

## 2021-02-13 ENCOUNTER — Other Ambulatory Visit: Payer: Self-pay | Admitting: Obstetrics and Gynecology

## 2021-02-13 ENCOUNTER — Encounter: Payer: Self-pay | Admitting: Obstetrics and Gynecology

## 2021-02-13 ENCOUNTER — Other Ambulatory Visit: Payer: Self-pay

## 2021-02-13 VITALS — BP 153/96 | HR 81 | Ht 66.0 in | Wt 216.0 lb

## 2021-02-13 DIAGNOSIS — N950 Postmenopausal bleeding: Secondary | ICD-10-CM

## 2021-02-13 LAB — POCT URINE PREGNANCY: Preg Test, Ur: NEGATIVE

## 2021-02-13 NOTE — Progress Notes (Signed)
55 yo postmenopausal for over 12 months presenting today for the evaluation of a 3 week history of postmenopausal vaginal bleeding. Patient describes the vaginal bleeding as spotting which she noted when she wiped. This bleeding took place end of January into February. Patient states she has not seen any vaginal bleeding since. She is not sexually active. She denies pelvic pain or abnormal discharge. Patient is without any other complaints. She was recently treated for BV and yeast infections.  Past Medical History:  Diagnosis Date  . Anxiety   . Arthritis   . Asthma   . Depression   . Hypertension    Past Surgical History:  Procedure Laterality Date  . APPENDECTOMY    . CHOLECYSTECTOMY    . CHONDROPLASTY Right 10/09/2020   Procedure: CHONDROPLASTY;  Surgeon: Bjorn Pippin, MD;  Location: [facility];  Service: Orthopedics;  Laterality: Right;  . KNEE ARTHROSCOPY WITH LATERAL MENISECTOMY Right 10/09/2020   Procedure: KNEE ARTHROSCOPY WITH LATERAL MENISECTOMY;  Surgeon: Bjorn Pippin, MD;  Location: [facility];  Service: Orthopedics;  Laterality: Right;  . KNEE ARTHROSCOPY WITH MEDIAL MENISECTOMY Right 10/09/2020   Procedure: KNEE ARTHROSCOPY WITH MEDIAL MENISECTOMY;  Surgeon: Bjorn Pippin, MD;  Location: [facility];  Service: Orthopedics;  Laterality: Right;  . KNEE ARTHROSCOPY WITH SUBCHONDROPLASTY Right 10/09/2020   Procedure: KNEE ARTHROSCOPY WITH SUBCHONDROPLASTY;  Surgeon: Bjorn Pippin, MD;  Location:  SURGERY CENTER;  Service: Orthopedics;  Laterality: Right;  . TUBAL LIGATION     Family History  Problem Relation Age of Onset  . Hypertension Mother   . Heart attack Mother   . Diabetes Mother   . Hypertension Sister   . Heart attack Sister   . Diabetes Sister   . Hypertension Brother   . Heart attack Brother   . Diabetes Brother   . Colitis Neg Hx   . Esophageal cancer Neg Hx   . Stomach cancer Neg Hx   . Rectal cancer Neg  Hx    Social History   Tobacco Use  . Smoking status: Current Every Day Smoker    Packs/day: 1.00    Years: 37.00    Pack years: 37.00    Types: Cigarettes  . Smokeless tobacco: Never Used  Vaping Use  . Vaping Use: Never used  Substance Use Topics  . Alcohol use: Not Currently  . Drug use: Never   ROS See pertinent in HPI. All other systems reviewed and non contributory Blood pressure (!) 153/96, pulse 81, height 5\' 6"  (1.676 m), weight 216 lb (98 kg). GENERAL: Well-developed, well-nourished female in no acute distress.  ABDOMEN: Soft, nontender, nondistended. No organomegaly. PELVIC: Normal external female genitalia. Vagina is pink and rugated.  Normal discharge. Normal appearing cervix with small polyp visualized at the os. Uterus is normal in size.  No adnexal mass or tenderness. EXTREMITIES: No cyanosis, clubbing, or edema, 2+ distal pulses.  A/P 55 yo with postmenopausal vaginal bleeding - pelvic ultrasound ordered - discussed benefits of endometrial biopsy ENDOMETRIAL BIOPSY     The indications for endometrial biopsy were reviewed.   Risks of the biopsy including cramping, bleeding, infection, uterine perforation, inadequate specimen and need for additional procedures  were discussed. The patient states she understands and agrees to undergo procedure today. Consent was signed. Time out was performed. Urine HCG was negative. A sterile speculum was placed in the patient's vagina and the cervix was prepped with Betadine. A single-toothed tenaculum was placed on  the anterior lip of the cervix to stabilize it. The uterine cavity was sounded to a depth of 7 cm using the uterine sound. The 3 mm pipelle was introduced into the endometrial cavity without difficulty, 2 passes were made.  A  moderate amount of tissue was  sent to pathology. The instruments were removed from the patient's vagina. A Kelly Forceps was used to grasp the cervical polyp and removed. Minimal bleeding from the  cervix was noted. The patient tolerated the procedure well.  Routine post-procedure instructions were given to the patient. The patient will follow up in two weeks to review the results and for further management.

## 2021-02-17 ENCOUNTER — Other Ambulatory Visit: Payer: 59

## 2021-02-17 ENCOUNTER — Ambulatory Visit: Payer: 59 | Admitting: Sports Medicine

## 2021-02-18 ENCOUNTER — Other Ambulatory Visit: Payer: Self-pay

## 2021-02-18 ENCOUNTER — Ambulatory Visit (INDEPENDENT_AMBULATORY_CARE_PROVIDER_SITE_OTHER): Payer: 59

## 2021-02-18 DIAGNOSIS — N950 Postmenopausal bleeding: Secondary | ICD-10-CM

## 2021-02-18 IMAGING — US US PELVIS COMPLETE WITH TRANSVAGINAL
1 series · 13 of 25 positions shown · non-contrast
Comparison: None

CLINICAL DATA: Postmenopausal bleeding for 1 month



[Series 1: us pelvis complete with transvaginal · 0.27mm/px · 13 of 89 slices shown]
[im 1/89]
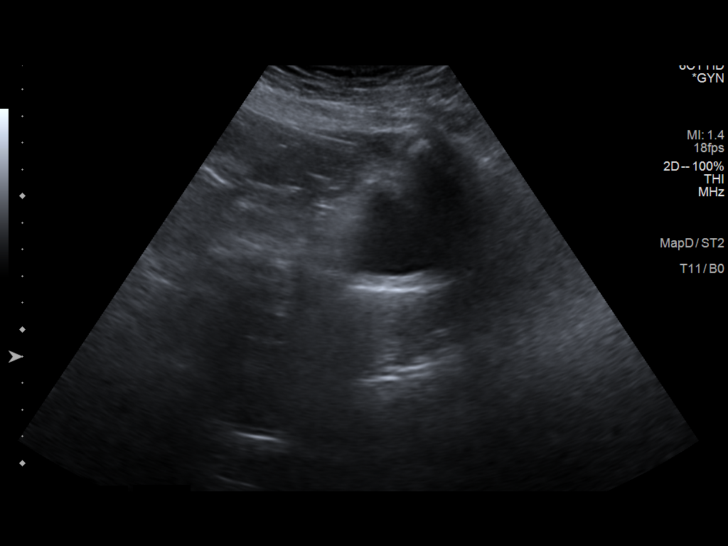
[im 8/89]
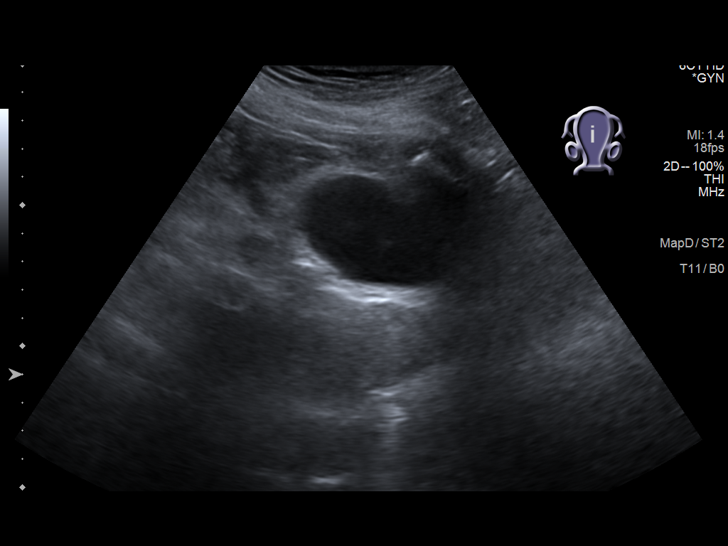
[im 15/89]
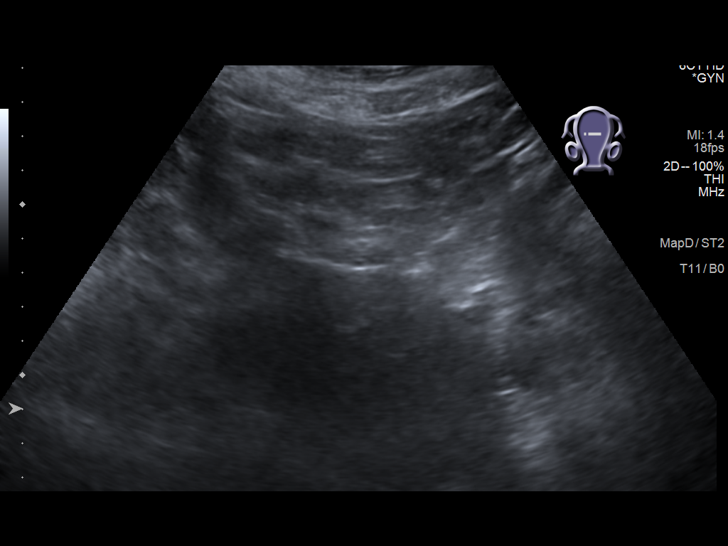
[im 23/89]
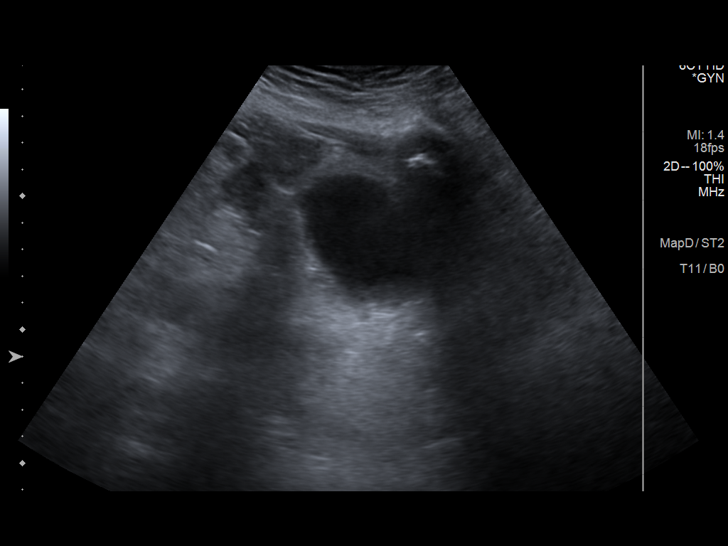
[im 30/89]
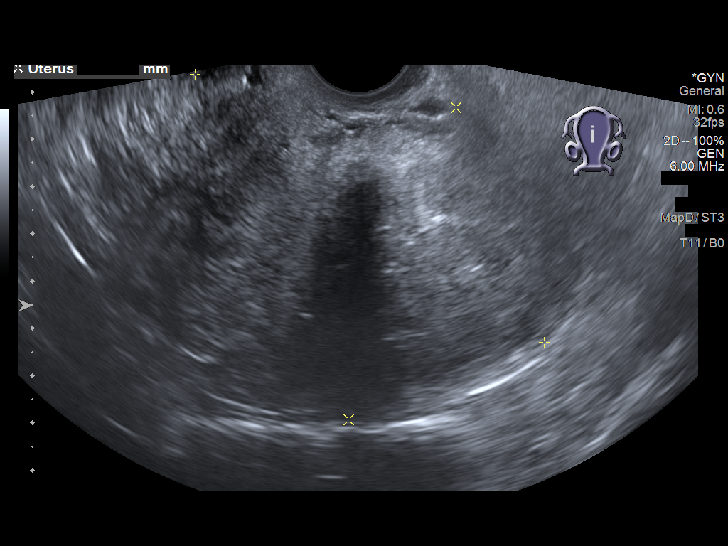
[im 37/89]
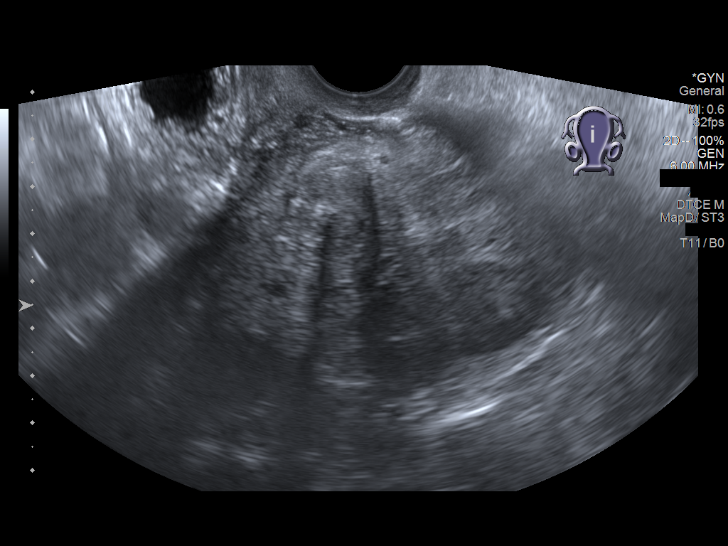
[im 45/89]
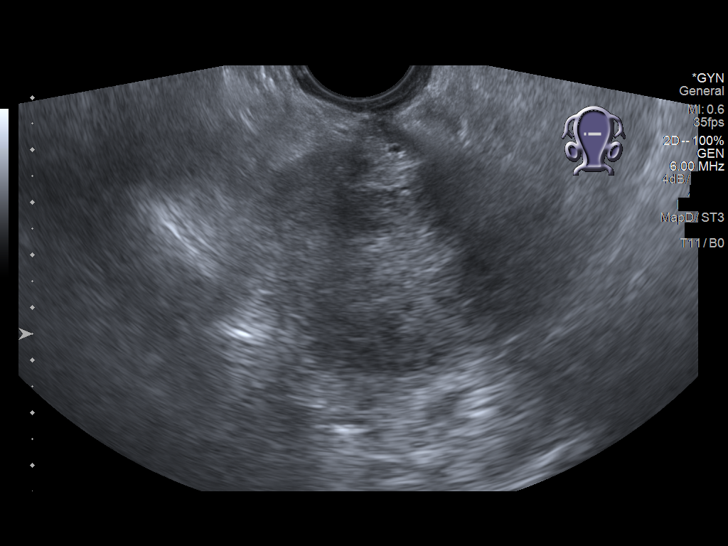
[im 52/89]
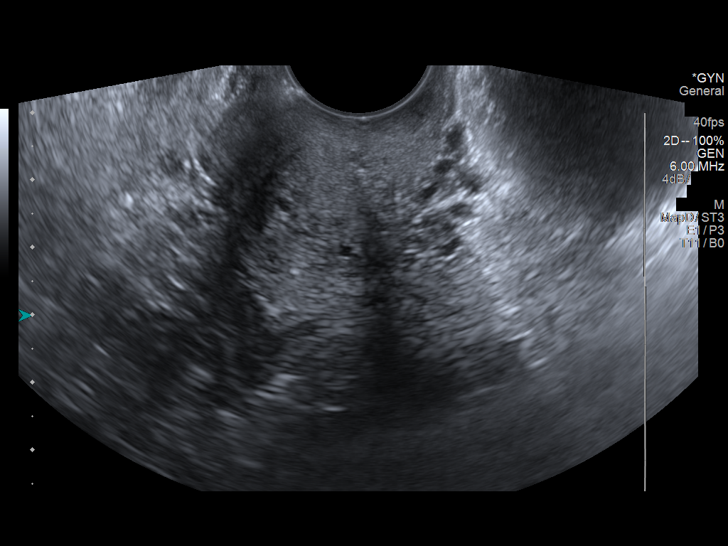
[im 59/89]
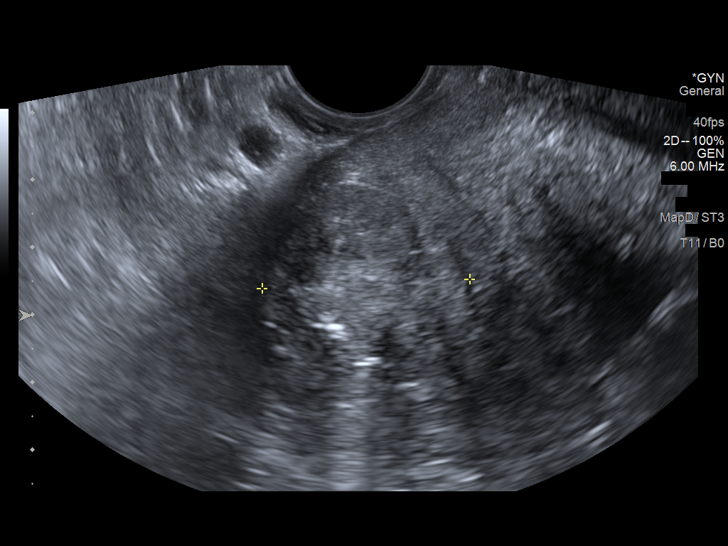
[im 67/89]
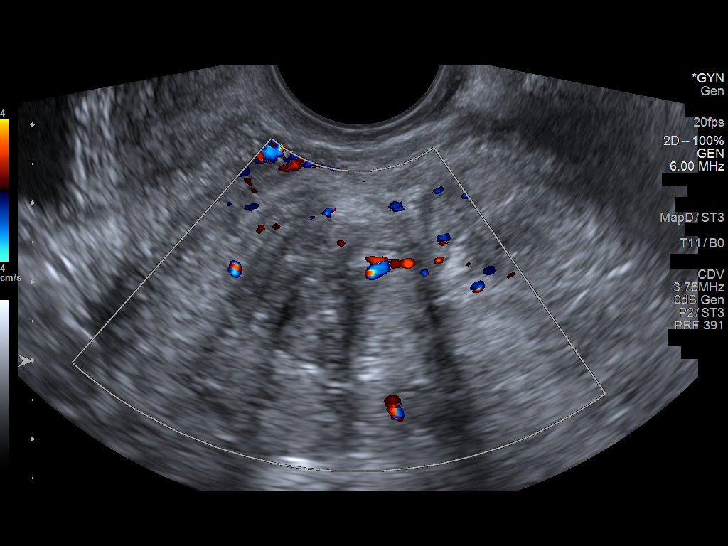
[im 74/89]
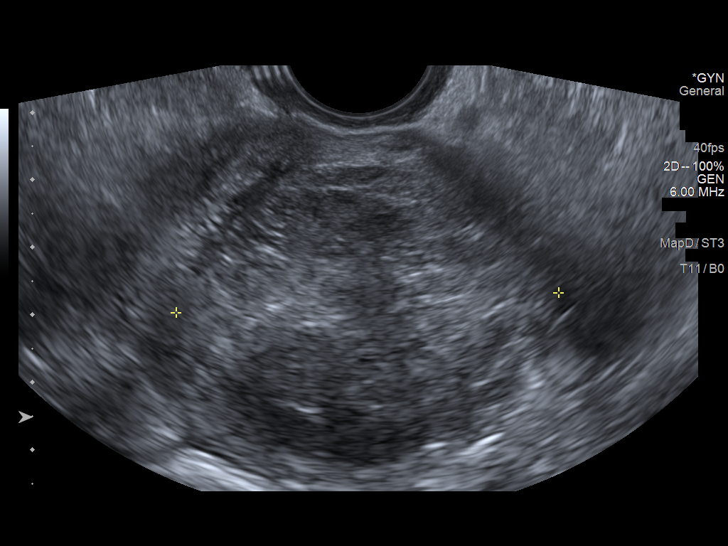
[im 81/89]
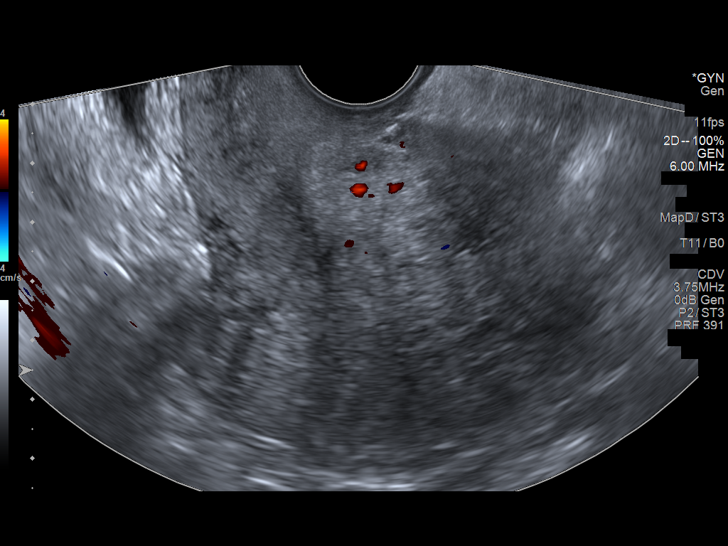
[im 89/89]
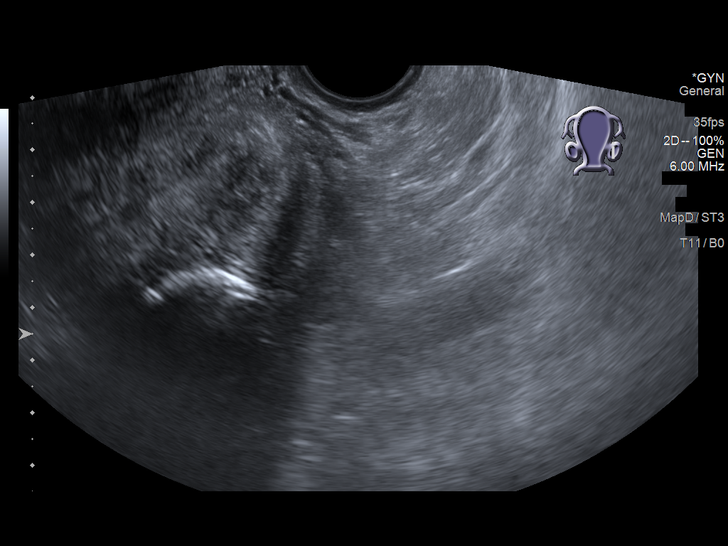

[13 of 25 positions shown; findings below may reference images not displayed]

FINDINGS: Uterus

Measurements: 9.3 x 7.0 x 8.8 cm = volume: 286 mL. Retroverted.
Heterogeneous myometrium. Multiple nodular areas likely representing
leiomyomata, poorly marginated. Probable leiomyomata measuring
cm at posterior upper uterus, 4.4 cm calcified exophytic leiomyoma
at anterior upper uterus, 2.9 cm intramural leiomyoma anterior
uterus, and an exophytic LEFT lateral leiomyoma 5.7 cm greatest
size.

Endometrium

Not adequately visualized/delineated due to presence of multiple
uterine leiomyomata

Right ovary

Not visualized, likely obscured by bowel

Left ovary

Not visualized, likely obscured by bowel

Other findings

No free pelvic fluid.  No adnexal masses.
IMPRESSION: Enlarged uterus containing multiple leiomyomata.

Nonvisualization of ovaries.

Nonvisualization of endometrial complex; in the setting of
postmenopausal bleeding and failure to adequately visualize the
endometrial complex, endometrial sampling is indicated to exclude
carcinoma. If results are benign, sonohysterogram should be
considered for focal lesion work-up. (Ref: Radiological Reasoning:
Algorithmic Workup of Abnormal Vaginal Bleeding with Endovaginal
Sonography and Sonohysterography. AJR [2X]; [id number])

## 2021-02-28 ENCOUNTER — Other Ambulatory Visit: Payer: Self-pay

## 2021-02-28 ENCOUNTER — Ambulatory Visit (INDEPENDENT_AMBULATORY_CARE_PROVIDER_SITE_OTHER): Payer: 59

## 2021-02-28 ENCOUNTER — Ambulatory Visit: Payer: 59 | Admitting: Sports Medicine

## 2021-02-28 DIAGNOSIS — M25461 Effusion, right knee: Secondary | ICD-10-CM | POA: Diagnosis not present

## 2021-02-28 NOTE — Assessment & Plan Note (Signed)
Jasmine Mejia returns, she is a pleasant 55 year old female, we aspirated and injected a Baker's cyst at the last visit, she did well and has no pain in the posterior aspect of her knee, unfortunately she complains of pain anteriorly, medially, she does have a significant effusion. Aspiration and injection of the knee joint today, return to see me in 1 month.

## 2021-02-28 NOTE — Progress Notes (Signed)
° ° °  Procedures performed today:    Procedure: Real-time Ultrasound Guided  aspiration/injection of right knee Device: Samsung HS60  Verbal informed consent obtained.  Time-out conducted.  Noted no overlying erythema, induration, or other signs of local infection.  Skin prepped in a sterile fashion.  Local anesthesia: Topical Ethyl chloride.  With sterile technique and under real time ultrasound guidance:  Noted effusion, using an 18-gauge needle aspirated approximately 40 mL of clear, straw-colored fluid, syringe switched and 1 cc Kenalog 42, 2 cc lidocaine, 2 cc bupivacaine injected easily Completed without difficulty  Advised to call if fevers/chills, erythema, induration, drainage, or persistent bleeding.  Images permanently stored and available for review in PACS.  Impression: Technically successful ultrasound guided injection.  Independent interpretation of notes and tests performed by another provider:   None.  Brief History, Exam, Impression, and Recommendations:    Effusion of knee joint right Jasmine Mejia returns, she is a pleasant 55 year old female, we aspirated and injected a Baker's cyst at the last visit, she did well and has no pain in the posterior aspect of her knee, unfortunately she complains of pain anteriorly, medially, she does have a significant effusion. Aspiration and injection of the knee joint today, return to see me in 1 month.    ___________________________________________ Ihor Austin. Benjamin Stain, M.D., ABFM., CAQSM. Primary Care and Sports Medicine [facility]  Adjunct Instructor of Family Medicine  [employer]

## 2021-03-26 ENCOUNTER — Ambulatory Visit
Admission: RE | Admit: 2021-03-26 | Discharge: 2021-03-26 | Disposition: A | Discharge status: Home / Self Care | Payer: 59 | Source: Ambulatory Visit | Attending: Medical-Surgical | Admitting: Medical-Surgical

## 2021-03-26 ENCOUNTER — Other Ambulatory Visit: Payer: Self-pay

## 2021-03-26 ENCOUNTER — Other Ambulatory Visit: Payer: Self-pay | Admitting: Medical-Surgical

## 2021-03-26 DIAGNOSIS — N63 Unspecified lump in unspecified breast: Secondary | ICD-10-CM

## 2021-03-26 DIAGNOSIS — N6489 Other specified disorders of breast: Secondary | ICD-10-CM

## 2021-03-26 IMAGING — US US BREAST*R* LIMITED INC AXILLA
1 series · 5 of 5 positions shown · non-contrast
Comparison: Previous exam(s).

CLINICAL DATA: Short-term follow-up for probably benign lesions in
each breast, initially assessed on [DATE].

EXAM:
DIGITAL DIAGNOSTIC UNILATERAL LEFT MAMMOGRAM WITH TOMOSYNTHESIS AND
CAD; ULTRASOUND RIGHT BREAST LIMITED; ULTRASOUND LEFT BREAST LIMITED
TECHNIQUE: Left digital diagnostic mammography and breast tomosynthesis was
performed. The images were evaluated with computer-aided detection.;
Targeted ultrasound examination of the right breast was performed;
Targeted ultrasound examination of the left breast was performed

[Series 1: us breast*right* limited inc axilla · 0.07mm/px · 5 of 5 slices shown]
[im 1/5]
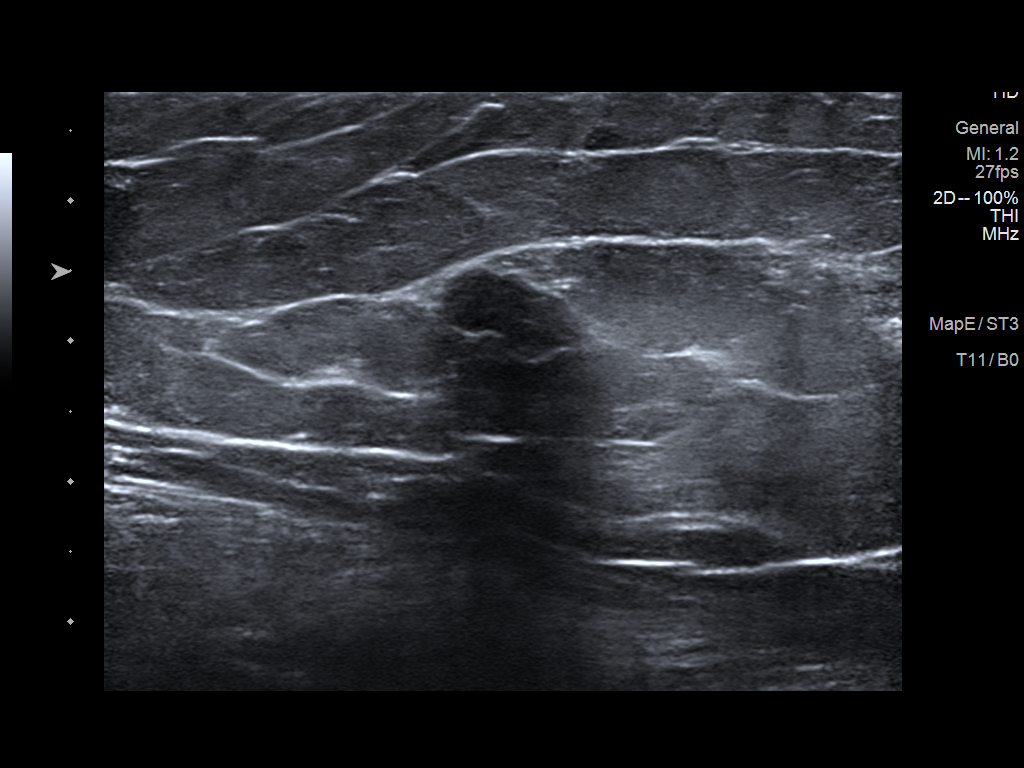
[im 2/5]
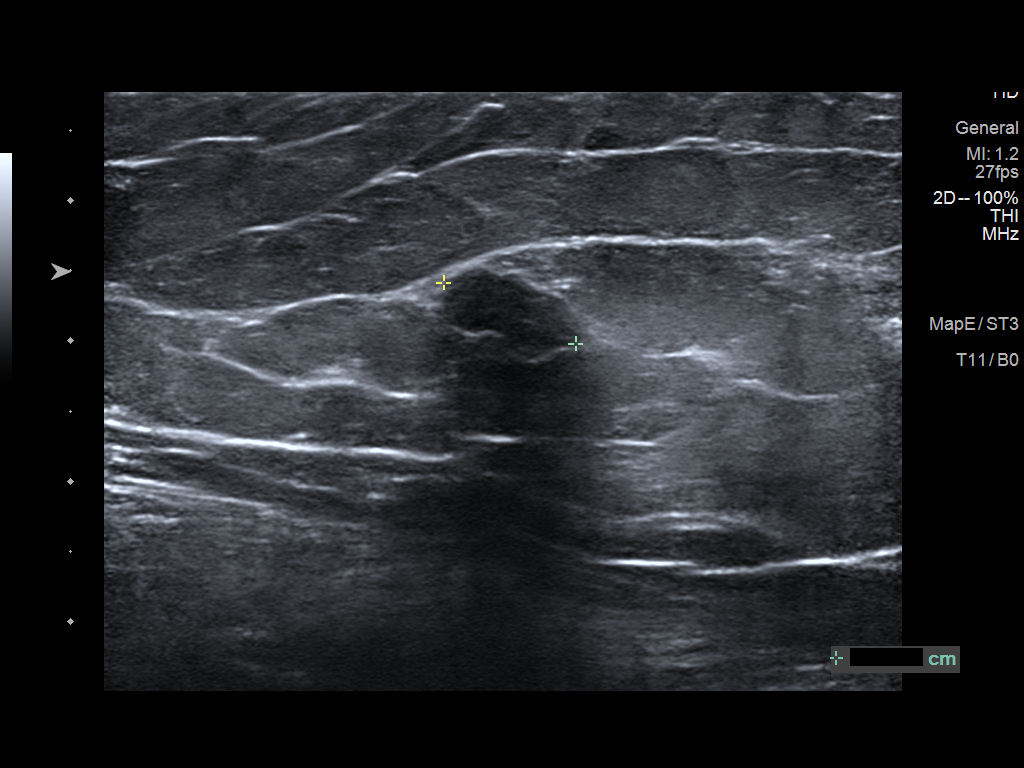
[im 3/5]
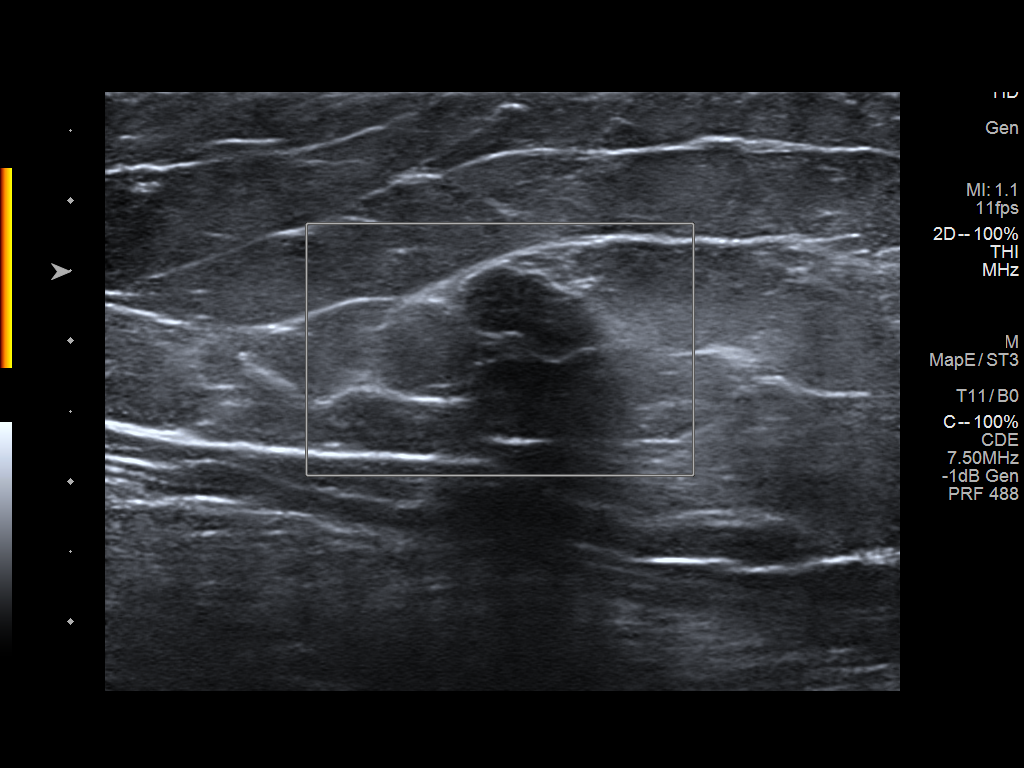
[im 4/5]
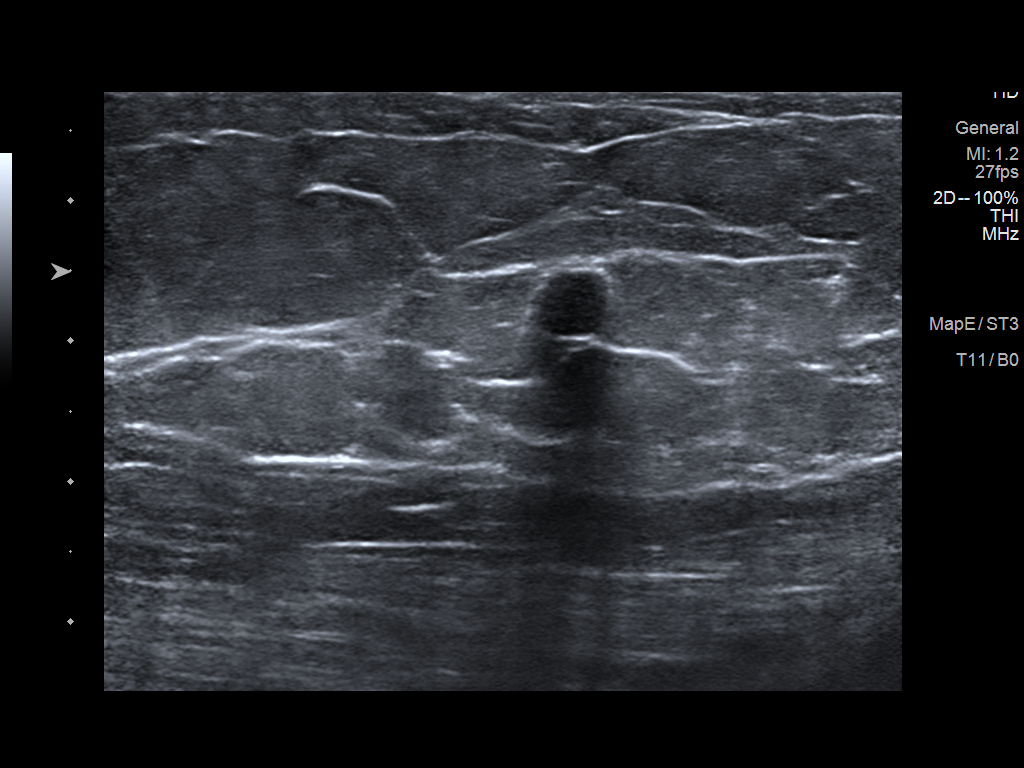
[im 5/5]
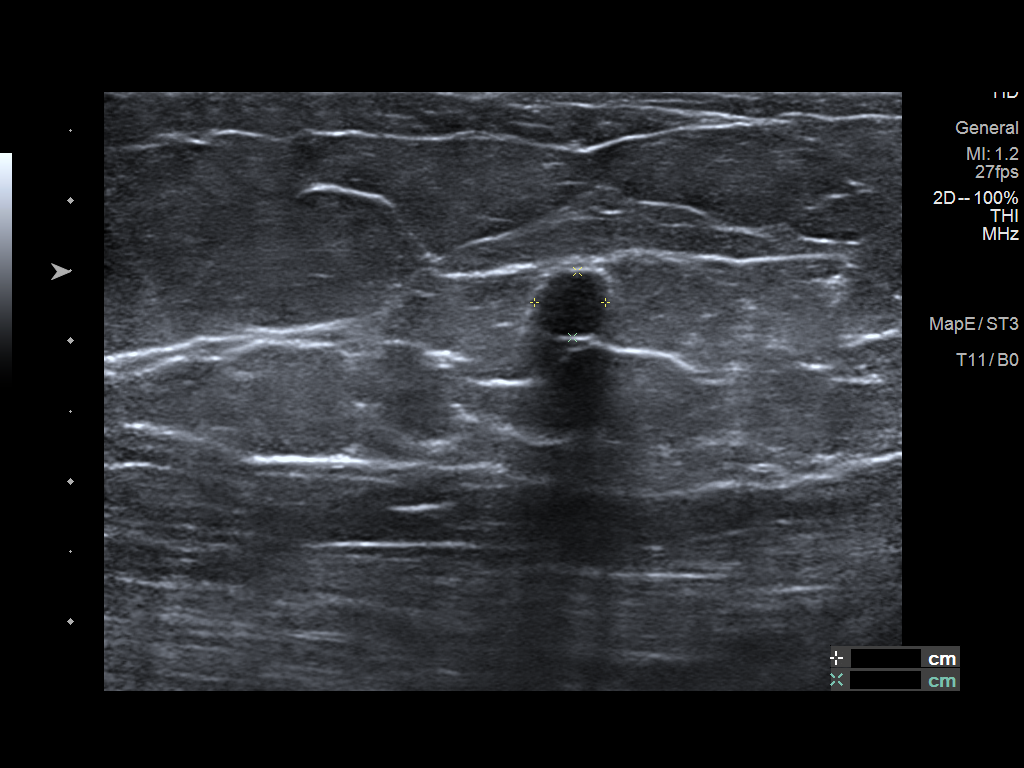

[5 of 5 positions shown; findings below may reference images not displayed]

ACR Breast Density Category b: There are scattered areas of
fibroglandular density.
FINDINGS: On the left, the round circumscribed mass in the upper breast is
without change. There are no new masses, no areas of architectural
distortion and no suspicious calcifications.

Targeted left breast ultrasound is performed, showing a cyst with a
thin septation, and a partly ill-defined margin, at 12 o'clock, 5 cm
the nipple, measuring 7 x 6 x 7 mm, unchanged.

Targeted right breast ultrasound is performed, showing a hypoechoic
mass with circumscribed mildly lobular margins, oval in overall
shape, at 1 o'clock, 10 cm the nipple, measuring 10 x 5 x 5 mm. This
is also stable from the prior exam.
IMPRESSION: 1. Probably benign bilateral breast masses, on the left consistent
with a minimally complicated cyst in on the right consistent with a
fibroadenoma. Both lesions are stable for 6 months. Additional
short-term follow-up recommended.

RECOMMENDATION:
Diagnostic mammography and bilateral breast ultrasound in 6 months.

I have discussed the findings and recommendations with the patient.
If applicable, a reminder letter will be sent to the patient
regarding the next appointment.

BI-RADS CATEGORY  3: Probably benign.

## 2021-03-26 IMAGING — MG MM DIGITAL DIAGNOSTIC UNILAT*L* W/ TOMO W/ CAD
4 series · 4 of 12 positions shown · non-contrast
Comparison: Previous exam(s).

CLINICAL DATA: Short-term follow-up for probably benign lesions in
each breast, initially assessed on [DATE].

EXAM:
DIGITAL DIAGNOSTIC UNILATERAL LEFT MAMMOGRAM WITH TOMOSYNTHESIS AND
CAD; ULTRASOUND RIGHT BREAST LIMITED; ULTRASOUND LEFT BREAST LIMITED
TECHNIQUE: Left digital diagnostic mammography and breast tomosynthesis was
performed. The images were evaluated with computer-aided detection.;
Targeted ultrasound examination of the right breast was performed;
Targeted ultrasound examination of the left breast was performed

[L CC synth-2D]
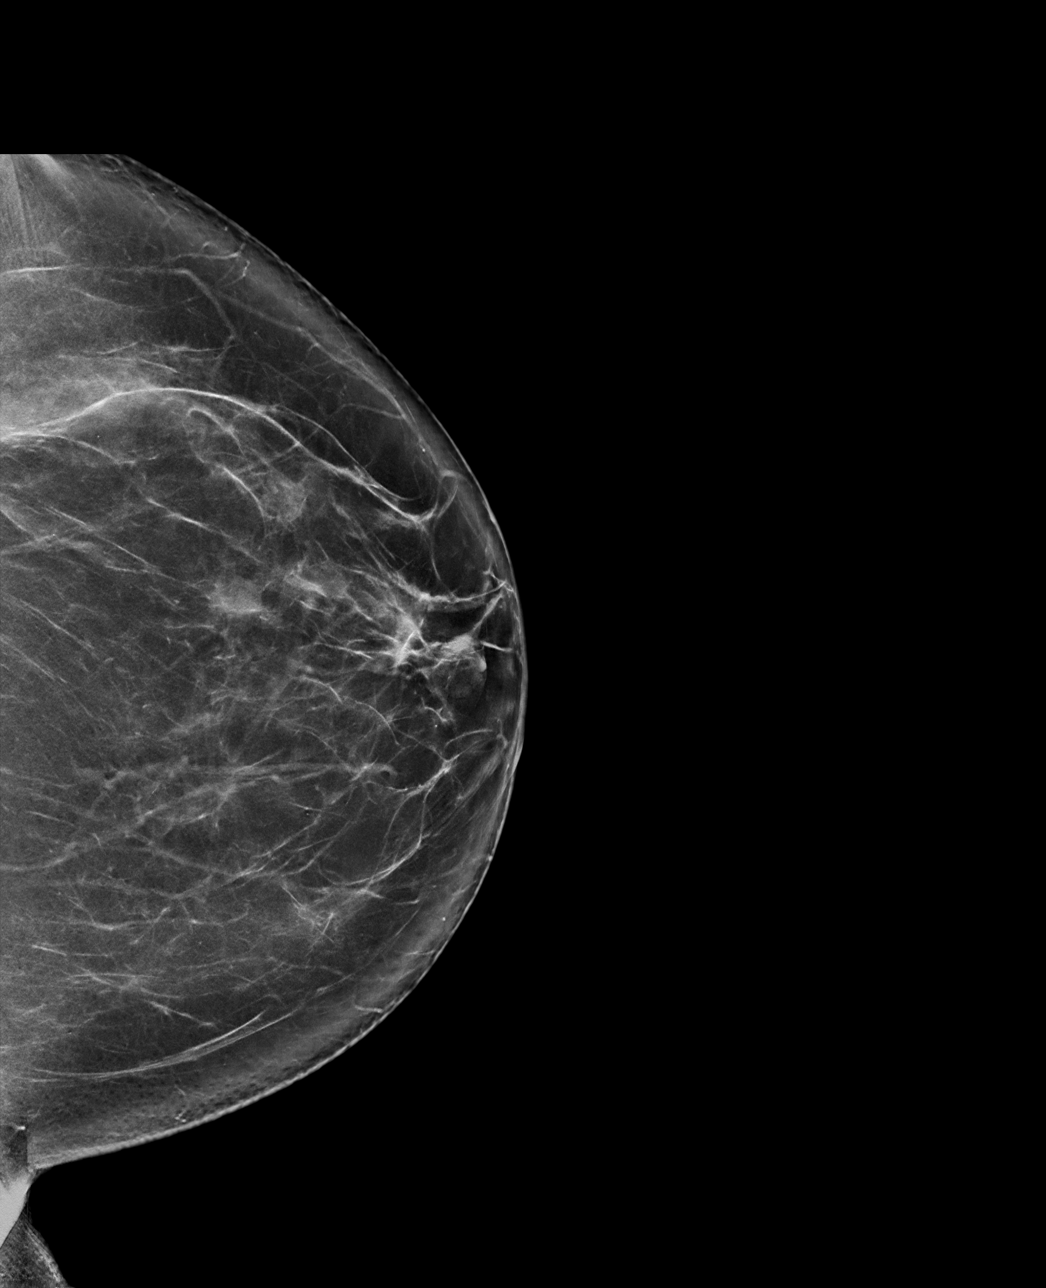

[L MLO synth-2D]
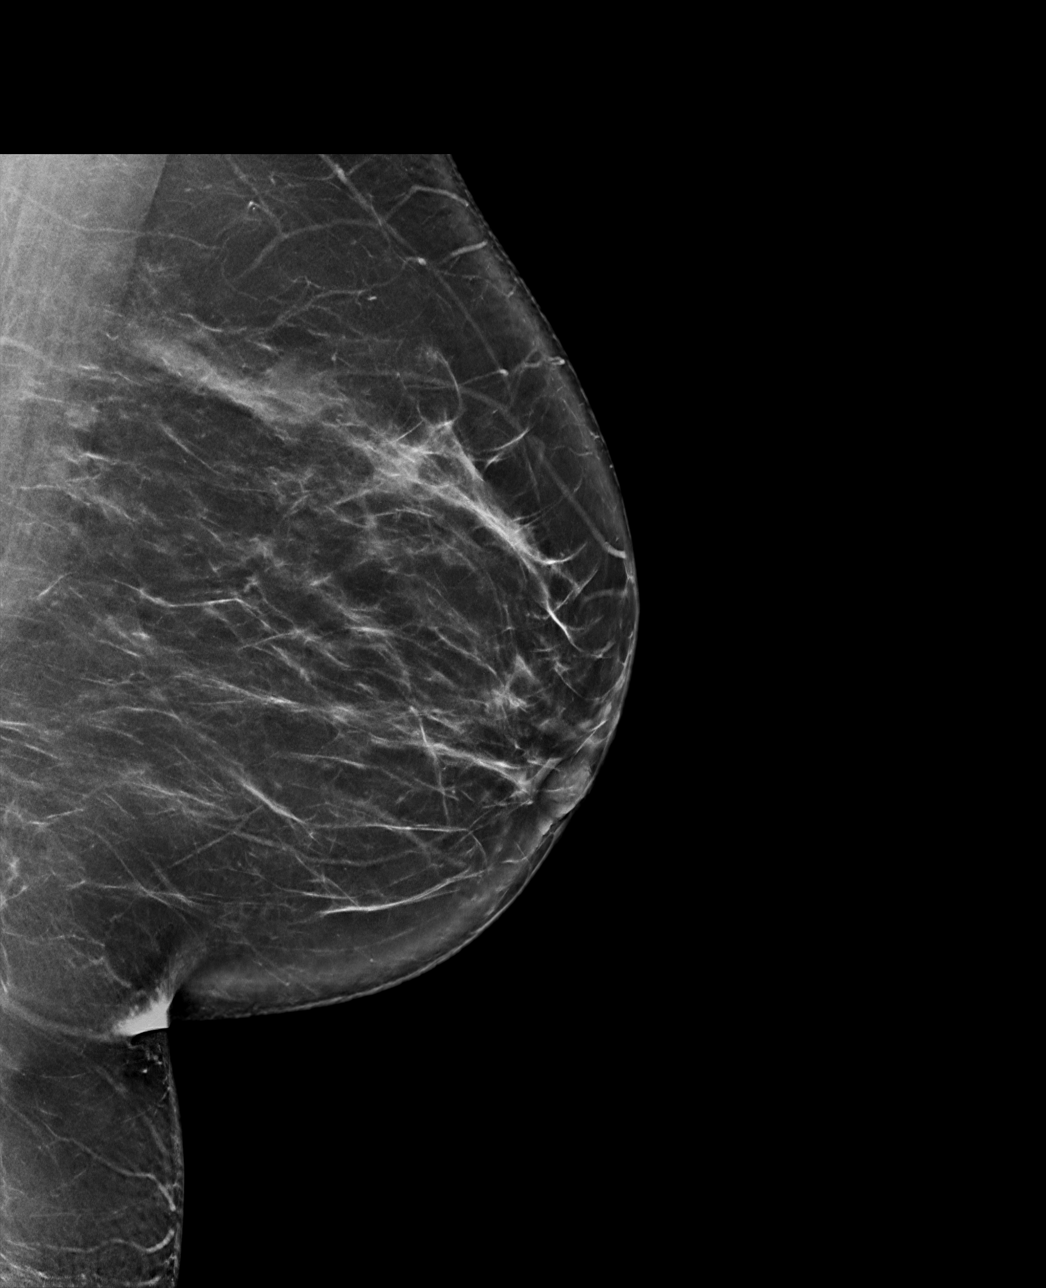

[L MLO tomo · tomo slice 47/93.0]
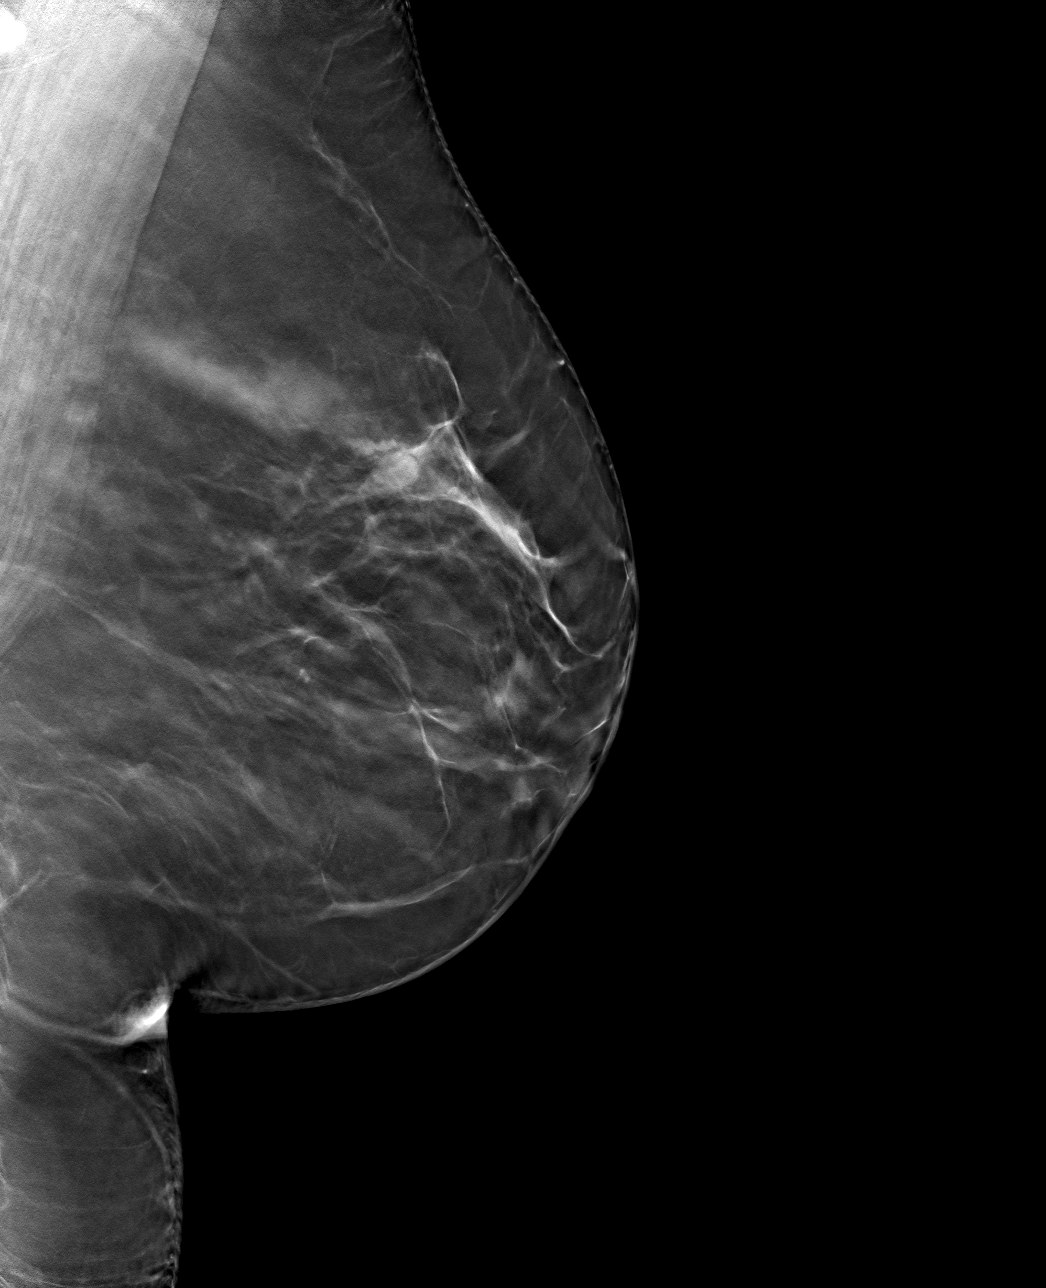

[L CC tomo · tomo slice 45/88.0]
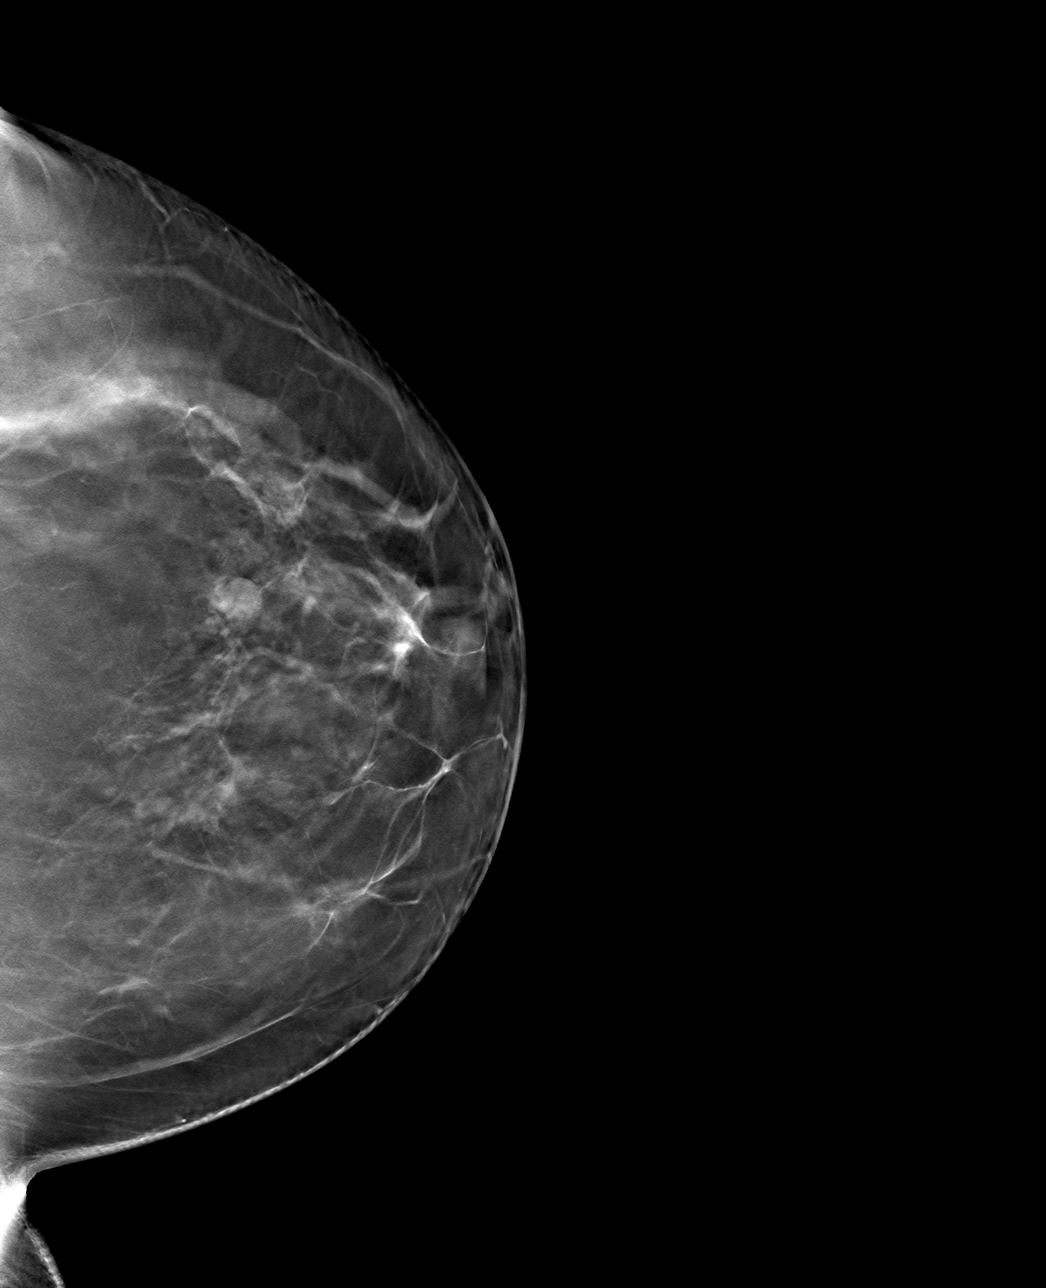

[4 of 12 positions shown; findings below may reference images not displayed]

ACR Breast Density Category b: There are scattered areas of
fibroglandular density.
FINDINGS: On the left, the round circumscribed mass in the upper breast is
without change. There are no new masses, no areas of architectural
distortion and no suspicious calcifications.

Targeted left breast ultrasound is performed, showing a cyst with a
thin septation, and a partly ill-defined margin, at 12 o'clock, 5 cm
the nipple, measuring 7 x 6 x 7 mm, unchanged.

Targeted right breast ultrasound is performed, showing a hypoechoic
mass with circumscribed mildly lobular margins, oval in overall
shape, at 1 o'clock, 10 cm the nipple, measuring 10 x 5 x 5 mm. This
is also stable from the prior exam.
IMPRESSION: 1. Probably benign bilateral breast masses, on the left consistent
with a minimally complicated cyst in on the right consistent with a
fibroadenoma. Both lesions are stable for 6 months. Additional
short-term follow-up recommended.

RECOMMENDATION:
Diagnostic mammography and bilateral breast ultrasound in 6 months.

I have discussed the findings and recommendations with the patient.
If applicable, a reminder letter will be sent to the patient
regarding the next appointment.

BI-RADS CATEGORY  3: Probably benign.

## 2021-03-26 IMAGING — US US BREAST*L* LIMITED INC AXILLA
1 series · 5 of 5 positions shown · non-contrast
Comparison: Previous exam(s).

CLINICAL DATA: Short-term follow-up for probably benign lesions in
each breast, initially assessed on [DATE].

EXAM:
DIGITAL DIAGNOSTIC UNILATERAL LEFT MAMMOGRAM WITH TOMOSYNTHESIS AND
CAD; ULTRASOUND RIGHT BREAST LIMITED; ULTRASOUND LEFT BREAST LIMITED
TECHNIQUE: Left digital diagnostic mammography and breast tomosynthesis was
performed. The images were evaluated with computer-aided detection.;
Targeted ultrasound examination of the right breast was performed;
Targeted ultrasound examination of the left breast was performed

[Series 1: us breast*left* limited inc axilla · 0.08mm/px · 5 of 5 slices shown]
[im 1/5]
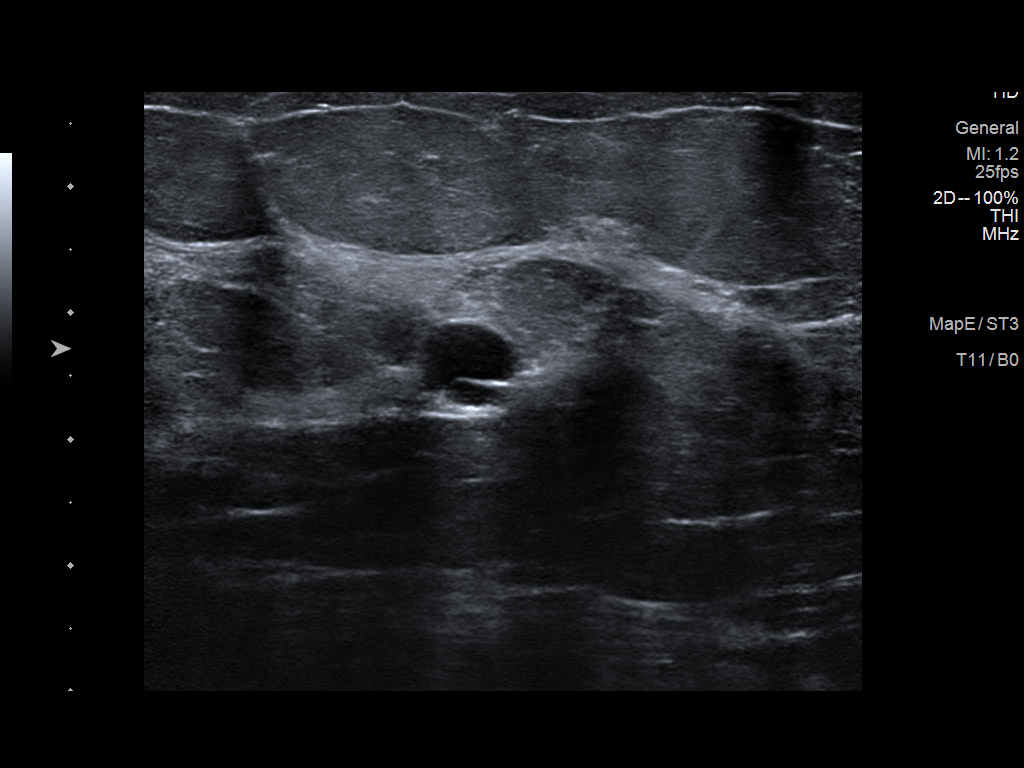
[im 2/5]
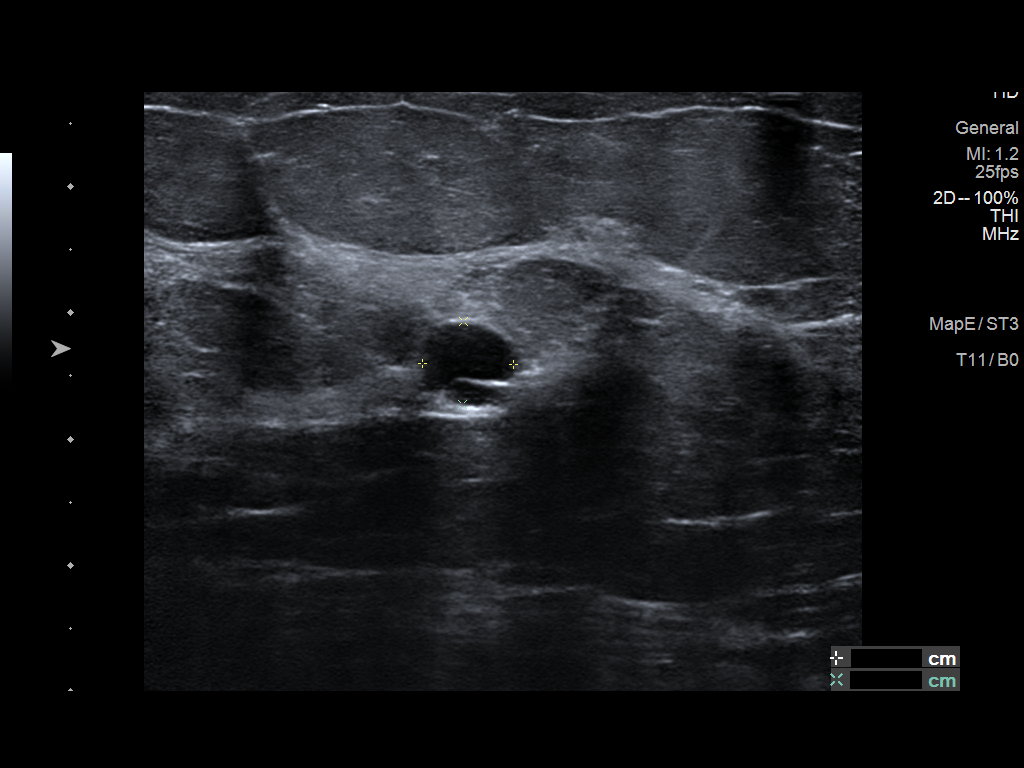
[im 3/5]
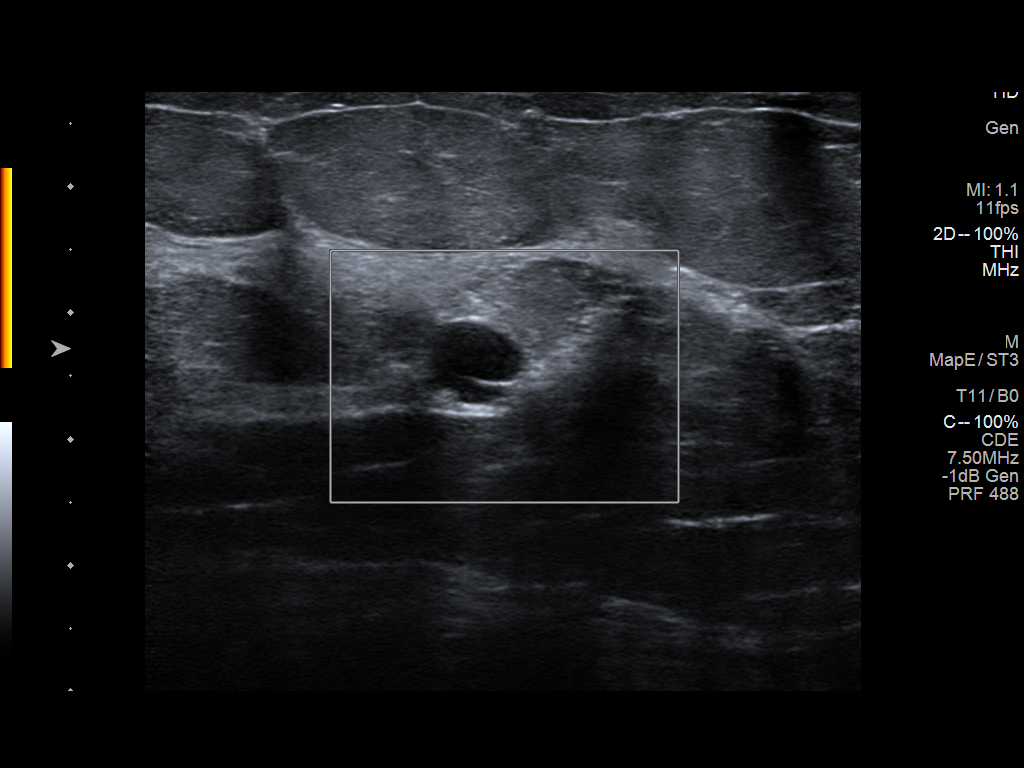
[im 4/5]
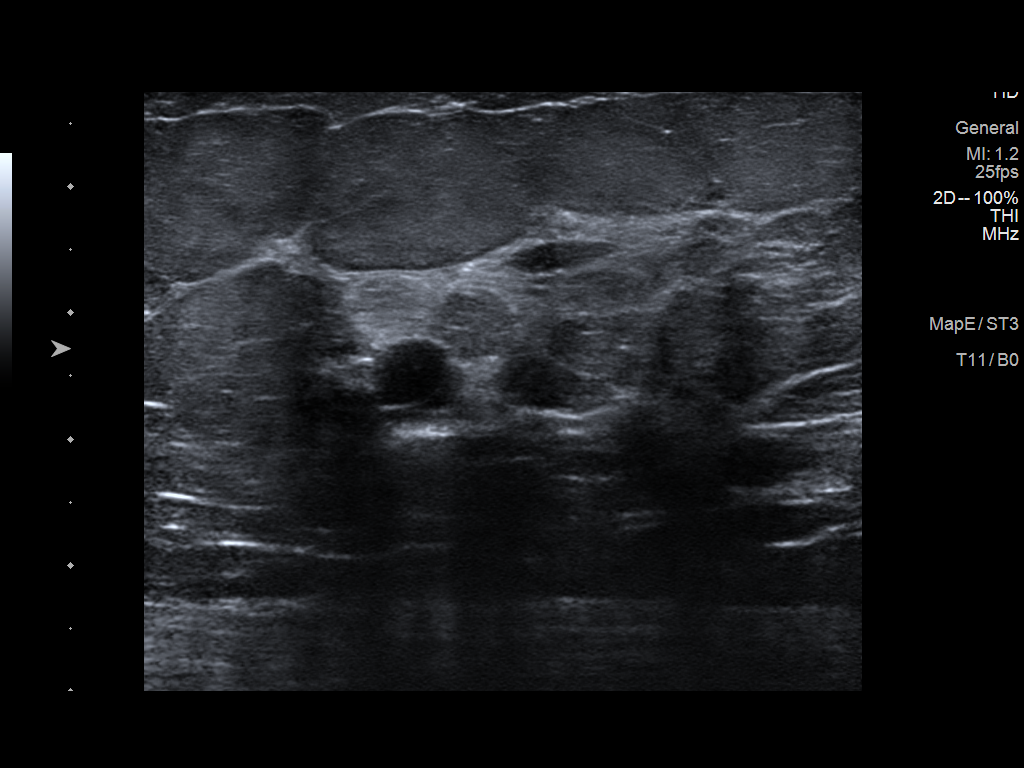
[im 5/5]
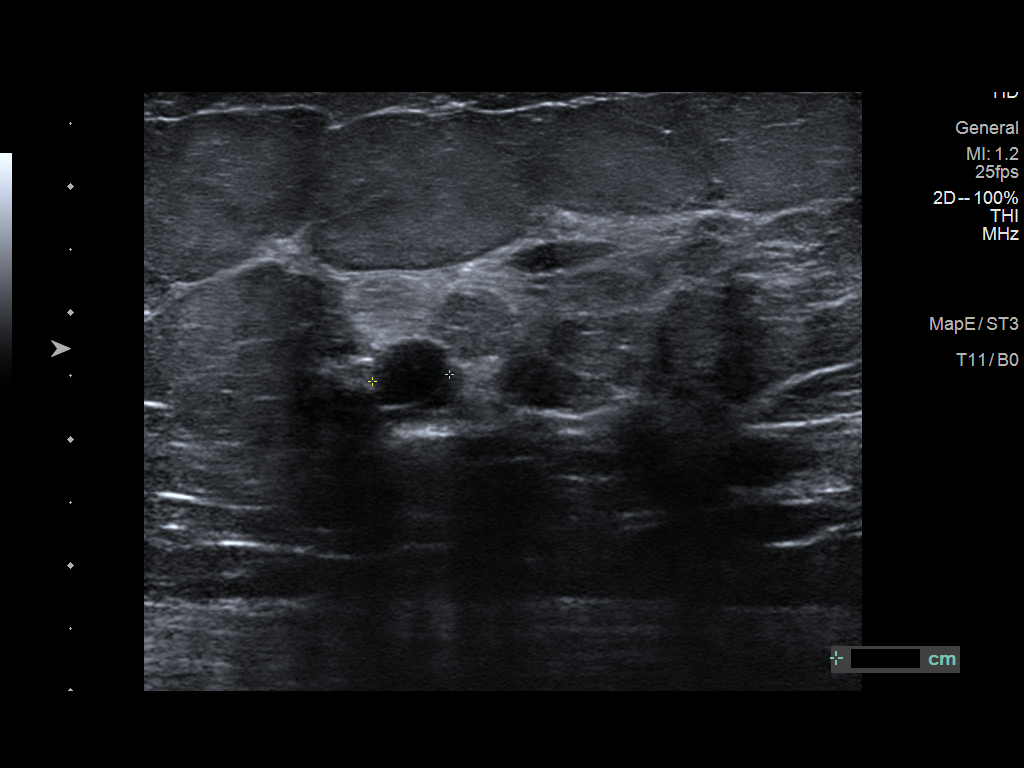

[5 of 5 positions shown; findings below may reference images not displayed]

ACR Breast Density Category b: There are scattered areas of
fibroglandular density.
FINDINGS: On the left, the round circumscribed mass in the upper breast is
without change. There are no new masses, no areas of architectural
distortion and no suspicious calcifications.

Targeted left breast ultrasound is performed, showing a cyst with a
thin septation, and a partly ill-defined margin, at 12 o'clock, 5 cm
the nipple, measuring 7 x 6 x 7 mm, unchanged.

Targeted right breast ultrasound is performed, showing a hypoechoic
mass with circumscribed mildly lobular margins, oval in overall
shape, at 1 o'clock, 10 cm the nipple, measuring 10 x 5 x 5 mm. This
is also stable from the prior exam.
IMPRESSION: 1. Probably benign bilateral breast masses, on the left consistent
with a minimally complicated cyst in on the right consistent with a
fibroadenoma. Both lesions are stable for 6 months. Additional
short-term follow-up recommended.

RECOMMENDATION:
Diagnostic mammography and bilateral breast ultrasound in 6 months.

I have discussed the findings and recommendations with the patient.
If applicable, a reminder letter will be sent to the patient
regarding the next appointment.

BI-RADS CATEGORY  3: Probably benign.

## 2021-03-28 ENCOUNTER — Other Ambulatory Visit: Payer: Self-pay

## 2021-03-28 ENCOUNTER — Ambulatory Visit (INDEPENDENT_AMBULATORY_CARE_PROVIDER_SITE_OTHER): Payer: 59

## 2021-03-28 ENCOUNTER — Ambulatory Visit (INDEPENDENT_AMBULATORY_CARE_PROVIDER_SITE_OTHER): Payer: 59 | Admitting: Sports Medicine

## 2021-03-28 DIAGNOSIS — M25562 Pain in left knee: Secondary | ICD-10-CM | POA: Diagnosis not present

## 2021-03-28 DIAGNOSIS — M25461 Effusion, right knee: Secondary | ICD-10-CM

## 2021-03-28 MED ORDER — HYDROCODONE-ACETAMINOPHEN 10-325 MG PO TABS: 1.0000 | ORAL_TABLET | Freq: Three times a day (TID) | ORAL | 0 refills | Status: DC | PRN

## 2021-03-28 NOTE — Progress Notes (Signed)
    Procedures performed today:    Procedure: Real-time Ultrasound Guided  aspiration of right knee Device: Samsung HS60  Verbal informed consent obtained.  Time-out conducted.  Noted no overlying erythema, induration, or other signs of local infection.  Skin prepped in a sterile fashion.  Local anesthesia: Topical Ethyl chloride.  With sterile technique and under real time ultrasound guidance:  Noted effusion, I advanced an 18-gauge needle into the suprapatellar recess and aspirated 40 mL of clear, straw-colored fluid.   Completed without difficulty  Advised to call if fevers/chills, erythema, induration, drainage, or persistent bleeding.  Images permanently stored and available for review in PACS.  Impression: Technically successful ultrasound guided injection.  Independent interpretation of notes and tests performed by another provider:   None.  Brief History, Exam, Impression, and Recommendations:    Effusion of knee joint right Jasmine Mejia returns, she is a pleasant 55 year old female with known knee osteoarthritis, at the last visit we aspirated and injected her right knee, unfortunately she is having recurrence of pain and swelling, therapeutic arthrocentesis today, MRI looking for meniscal tearing, pain at the joint line, positive McMurray's, severe effusion. This is for surgical planning.    ___________________________________________ Ihor Austin. Benjamin Stain, M.D., ABFM., CAQSM. Primary Care and Sports Medicine [facility]  Adjunct Instructor of Family Medicine  [employer]

## 2021-03-28 NOTE — Assessment & Plan Note (Signed)
Jasmine Mejia returns, she is a pleasant 55 year old female with known knee osteoarthritis, at the last visit we aspirated and injected her right knee, unfortunately she is having recurrence of pain and swelling, therapeutic arthrocentesis today, MRI looking for meniscal tearing, pain at the joint line, positive McMurray's, severe effusion. This is for surgical planning.

## 2021-03-30 ENCOUNTER — Other Ambulatory Visit: Payer: Self-pay

## 2021-03-30 ENCOUNTER — Ambulatory Visit (INDEPENDENT_AMBULATORY_CARE_PROVIDER_SITE_OTHER): Payer: 59

## 2021-03-30 DIAGNOSIS — M25561 Pain in right knee: Secondary | ICD-10-CM

## 2021-03-30 DIAGNOSIS — M25461 Effusion, right knee: Secondary | ICD-10-CM | POA: Diagnosis not present

## 2021-03-30 IMAGING — MR MR KNEE*R* W/O CM
7 series · 40 of 40 positions shown · non-contrast
Comparison: Radiographs [DATE].  MRI [DATE].

CLINICAL DATA: Medial knee pain with swelling since surgery
[DATE] (subchondroplasty and meniscal debridement). Subsequent
joint aspirations.

EXAM:
MRI OF THE RIGHT KNEE WITHOUT CONTRAST
TECHNIQUE: Multiplanar, multisequence MR imaging of the knee was performed. No
intravenous contrast was administered.

[Series 3: T2 fat-sat · axial · 4.0mm · 0.53mm/px · z∈[-79,+106]mm · 6 of 38 slices shown (1 of 3)]
[im 1/38]
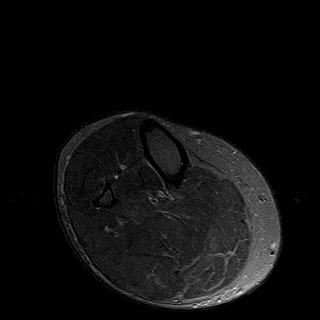
[im 8/38]
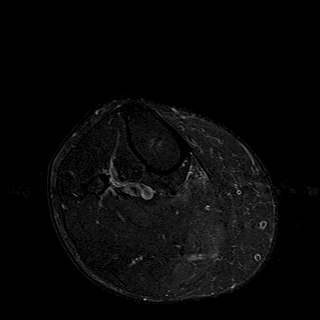
[im 15/38]
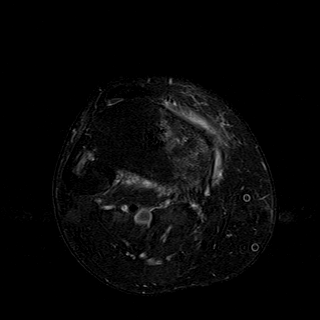
[im 23/38]
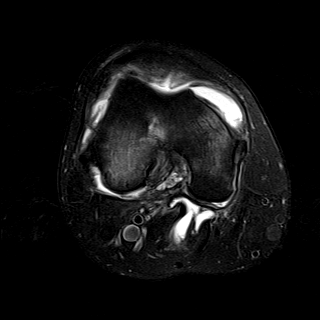
[im 30/38]
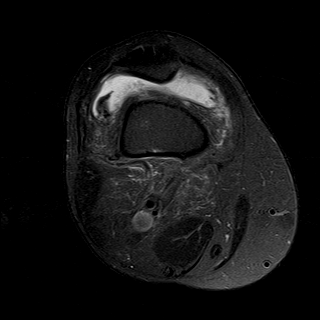
[im 38/38]
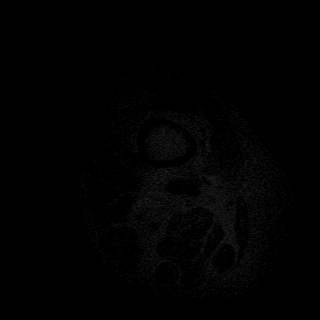

[Series 4: T1 · coronal · 4.0mm · 0.66mm/px · 6 of 29 slices shown]
[im 1/29]
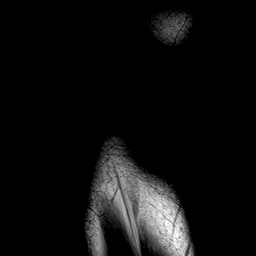
[im 6/29]
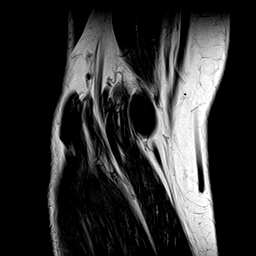
[im 12/29]
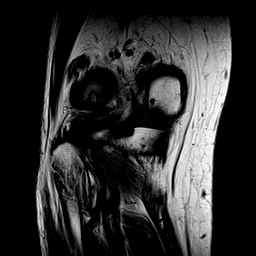
[im 17/29]
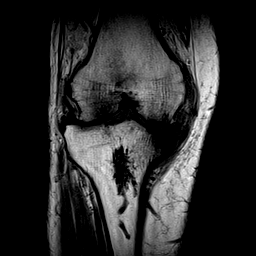
[im 23/29]
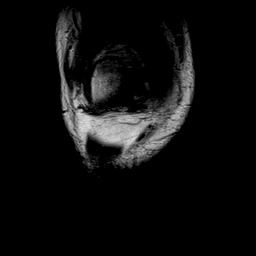
[im 29/29]
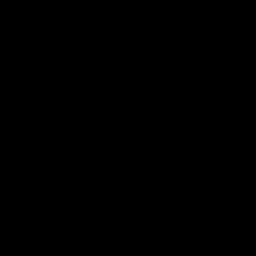

[Series 5: T2 fat-sat · coronal · 4.0mm · 0.66mm/px · 6 of 29 slices shown (2 of 3)]
[im 1/29]
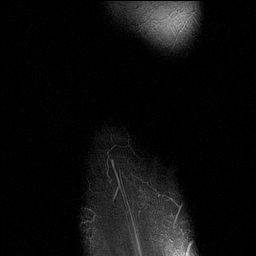
[im 6/29]
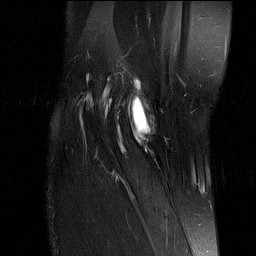
[im 12/29]
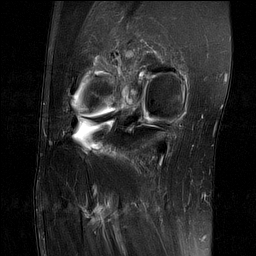
[im 17/29]
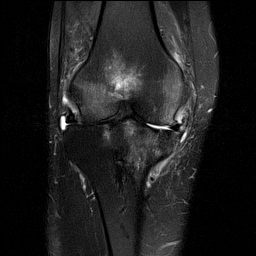
[im 23/29]
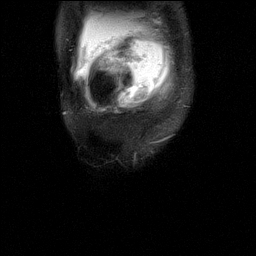
[im 29/29]
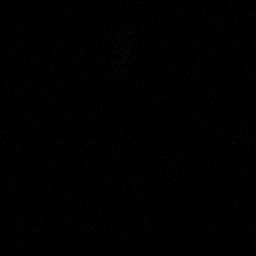

[Series 6: PD fat-sat · coronal · 4.0mm · 0.66mm/px · 6 of 29 slices shown (1 of 3)]
[im 1/29]
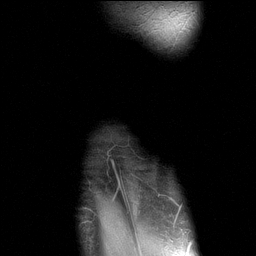
[im 6/29]
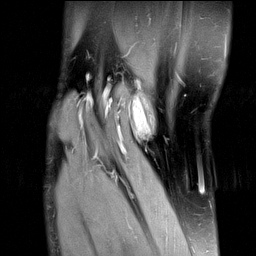
[im 12/29]
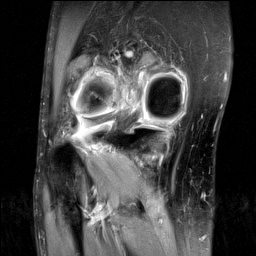
[im 17/29]
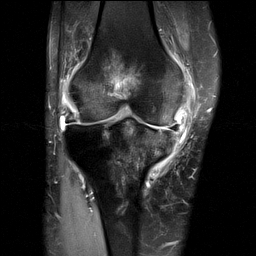
[im 23/29]
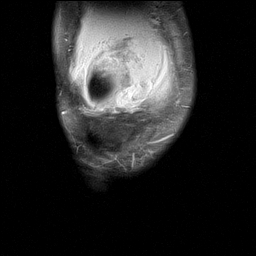
[im 29/29]
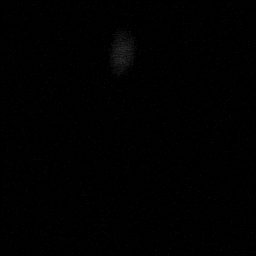

[Series 7: PD fat-sat · sagittal · 3.0mm · 0.66mm/px · 6 of 33 slices shown (2 of 3)]
[im 1/33]
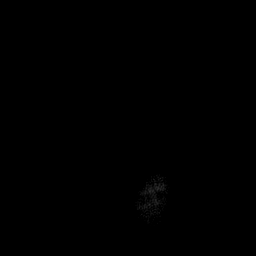
[im 7/33]
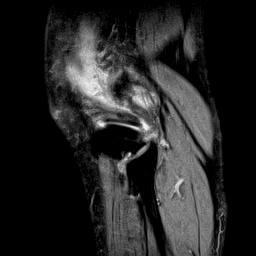
[im 13/33]
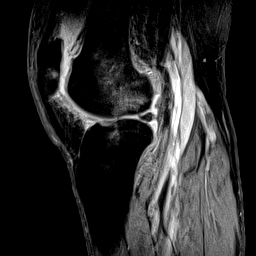
[im 20/33]
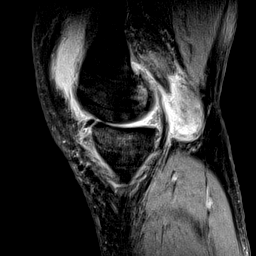
[im 26/33]
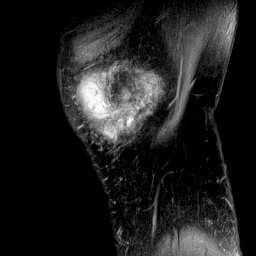
[im 33/33]
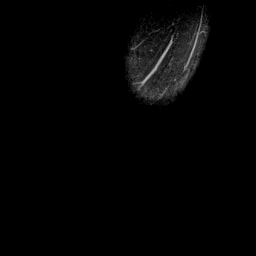

[Series 8: T2 fat-sat · sagittal · 3.0mm · 0.66mm/px · 6 of 33 slices shown (3 of 3)]
[im 1/33]
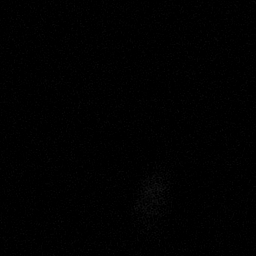
[im 7/33]
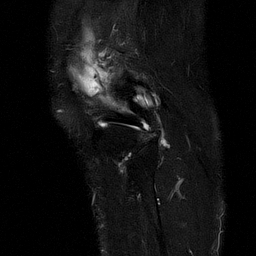
[im 13/33]
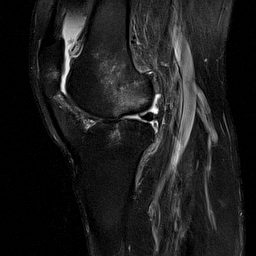
[im 20/33]
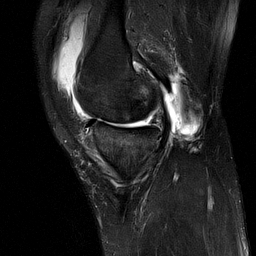
[im 26/33]
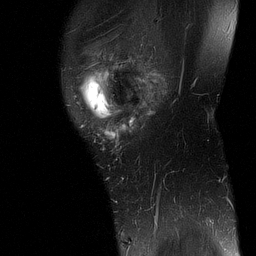
[im 33/33]
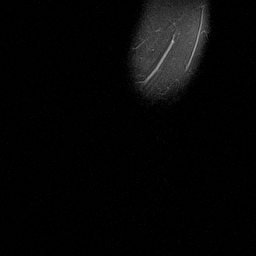

[Series 9: PD fat-sat · oblique · 2.0mm · 0.62mm/px · 4 of 19 slices shown (3 of 3)]
[im 1/19]
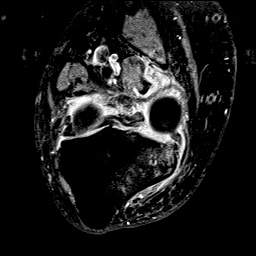
[im 7/19]
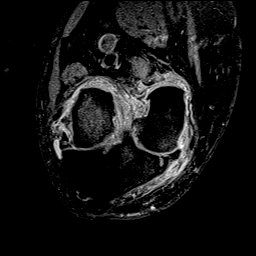
[im 13/19]
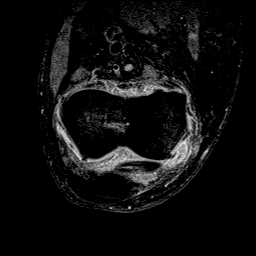
[im 19/19]
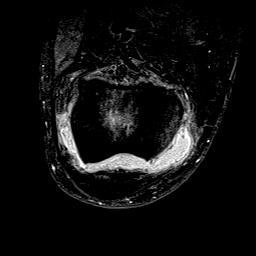

[40 of 40 positions shown; findings below may reference images not displayed]

FINDINGS: MENISCI

Medial meniscus: Postsurgical changes consistent with partial
meniscectomy. The posterior horn and body are diminutive without
evidence of recurrent tear.

Lateral meniscus: Postsurgical changes consistent with partial
meniscectomy. The anterior horn and body are diminutive without
evidence of recurrent tear.

LIGAMENTS

Cruciates: Progressive ACL mucoid degeneration without evidence of
tear. The PCL is intact.

Collaterals: Intact. Progressive MCL degeneration and medial
buckling with a small amount of fluid in the pes answering bursa.

CARTILAGE

Patellofemoral: Mildly progressive patellofemoral degenerative
changes with increased subchondral cyst formation throughout the
lateral patellar facet.

Medial: Full-thickness chondral thinning throughout the medial
compartment with new subchondral eburnation within the medial
femoral condyle. There is progressive sclerosis and fragmentation of
the medial tibial plateau at the site of the previously treated
fracture.

Lateral: Progressive chondral thinning throughout the lateral
compartment with new focal subchondral sclerosis and surrounding
edema in the lateral femoral condyle.

MISCELLANEOUS

Joint: Moderate to large knee joint effusion with diffuse synovial
irregularity. No discrete loose bodies identified.

Popliteal Fossa: Moderate-sized complex Baker's cyst, slightly
decreased in size from previous study.

Extensor Mechanism:  Intact.

Bones: As above, progressive tricompartmental degenerative changes
with increased subchondral edema in both femoral condyles. In
addition, there is heterogeneous T2 hyperintensity centrally in the
distal femoral metaphysis without radiographic correlate. In the
proximal tibial metaphysis, there is a new sclerotic lesion on the
patient's radiographs which is may be related to prior chondroplasty
or an atypical bone infarct.

Other: No other significant periarticular soft tissue findings.
IMPRESSION: 1. Progressive age advanced tricompartmental degenerative changes
compared with previous MRI from 6 months ago.
2. There are new marrow changes in the distal femur and proximal
tibia which may relate to interval subchondroplasty or atypical bone
infarcts. The medial tibial plateau remains somewhat fragmented. CT
may be helpful for further evaluation.
3. Postsurgical changes in both menisci consistent with partial
meniscectomy. No recurrent meniscal tear identified.
4. Progressive ACL mucoid degeneration.
5. Moderate to large knee joint effusion with diffuse synovial
irregularity consistent with synovitis.

## 2021-04-07 ENCOUNTER — Ambulatory Visit (INDEPENDENT_AMBULATORY_CARE_PROVIDER_SITE_OTHER): Payer: 59 | Admitting: Sports Medicine

## 2021-04-07 ENCOUNTER — Other Ambulatory Visit: Payer: Self-pay

## 2021-04-07 DIAGNOSIS — M1711 Unilateral primary osteoarthritis, right knee: Secondary | ICD-10-CM | POA: Diagnosis not present

## 2021-04-07 DIAGNOSIS — M25562 Pain in left knee: Secondary | ICD-10-CM

## 2021-04-07 MED ORDER — HYDROCODONE-ACETAMINOPHEN 10-325 MG PO TABS: 1.0000 | ORAL_TABLET | Freq: Three times a day (TID) | ORAL | 0 refills | Status: DC | PRN

## 2021-04-07 MED ORDER — CYCLOBENZAPRINE HCL 10 MG PO TABS: ORAL_TABLET | ORAL | 0 refills | Status: DC

## 2021-04-07 NOTE — Assessment & Plan Note (Signed)
This is a pleasant 55 year old female with known right knee osteoarthritis, she has had several aspirations and injections, activity modification, conditioning exercises, NSAIDs. Ultimately an MRI that showed severe DJD. I will refill her pain medication and add some Flexeril per her request but she understands that I really do not have anything else to offer from a nonsurgical perspective. I would like her to touch base with Dr. Luiz Blare.

## 2021-04-07 NOTE — Progress Notes (Signed)
    Procedures performed today:    None.  Independent interpretation of notes and tests performed by another provider:   None.  Brief History, Exam, Impression, and Recommendations:    Primary osteoarthritis of right knee This is a pleasant 55 year old female with known right knee osteoarthritis, she has had several aspirations and injections, activity modification, conditioning exercises, NSAIDs. Ultimately an MRI that showed severe DJD. I will refill her pain medication and add some Flexeril per her request but she understands that I really do not have anything else to offer from a nonsurgical perspective. I would like her to touch base with Dr. Luiz Blare.    ___________________________________________ Ihor Austin. Benjamin Stain, M.D., ABFM., CAQSM. Primary Care and Sports Medicine [facility]  Adjunct Instructor of Family Medicine  [employer]

## 2021-05-06 ENCOUNTER — Other Ambulatory Visit: Payer: Self-pay | Admitting: Medical-Surgical

## 2021-05-06 ENCOUNTER — Other Ambulatory Visit: Payer: Self-pay | Admitting: Orthopedic Surgery

## 2021-05-09 ENCOUNTER — Ambulatory Visit: Payer: 59 | Admitting: Medical-Surgical

## 2021-05-09 ENCOUNTER — Encounter: Payer: Self-pay | Admitting: Medical-Surgical

## 2021-05-09 ENCOUNTER — Other Ambulatory Visit: Payer: Self-pay

## 2021-05-09 VITALS — BP 175/106 | HR 86 | Temp 98.9°F | Resp 20 | Ht 66.0 in | Wt 216.0 lb

## 2021-05-09 DIAGNOSIS — Z01818 Encounter for other preprocedural examination: Secondary | ICD-10-CM

## 2021-05-09 NOTE — Progress Notes (Signed)
Subjective:    CC: Surgical clearance  HPI: Pleasant 55 year old female presenting today to discuss surgical clearance.  She has a right total knee arthroplasty scheduled with Dr. Jodi Geralds on May 30, 2021.  Has had a long history of right knee pain and is looking forward to having this fixed.  Notes that she did forget to take her blood pressure medications today but usually takes them as prescribed.  She continues to smoke but has plans to stop at least 2 weeks prior to her surgery and endorses that she wants to quit completely.  Denies chest pain, shortness of breath, dizziness, headaches, and syncopal episodes.  I reviewed the past medical history, family history, social history, surgical history, and allergies today and no changes were needed.  Please see the problem list section below in epic for further details.  Past Medical History: Past Medical History:  Diagnosis Date  . Anxiety   . Arthritis   . Asthma   . Depression   . Hypertension    Past Surgical History: Past Surgical History:  Procedure Laterality Date  . APPENDECTOMY    . CHOLECYSTECTOMY    . CHONDROPLASTY Right 10/09/2020   Procedure: CHONDROPLASTY;  Surgeon: Bjorn Pippin, MD;  Location: [facility];  Service: Orthopedics;  Laterality: Right;  . KNEE ARTHROSCOPY WITH LATERAL MENISECTOMY Right 10/09/2020   Procedure: KNEE ARTHROSCOPY WITH LATERAL MENISECTOMY;  Surgeon: Bjorn Pippin, MD;  Location: [facility];  Service: Orthopedics;  Laterality: Right;  . KNEE ARTHROSCOPY WITH MEDIAL MENISECTOMY Right 10/09/2020   Procedure: KNEE ARTHROSCOPY WITH MEDIAL MENISECTOMY;  Surgeon: Bjorn Pippin, MD;  Location: [facility];  Service: Orthopedics;  Laterality: Right;  . KNEE ARTHROSCOPY WITH SUBCHONDROPLASTY Right 10/09/2020   Procedure: KNEE ARTHROSCOPY WITH SUBCHONDROPLASTY;  Surgeon: Bjorn Pippin, MD;  Location: [facility];  Service: Orthopedics;  Laterality:  Right;  . TUBAL LIGATION     Social History: Social History   Socioeconomic History  . Marital status: Single    Spouse name: Not on file  . Number of children: Not on file  . Years of education: Not on file  . Highest education level: Not on file  Occupational History  . Not on file  Tobacco Use  . Smoking status: Current Every Day Smoker    Packs/day: 1.00    Years: 37.00    Pack years: 37.00    Types: Cigarettes  . Smokeless tobacco: Never Used  Vaping Use  . Vaping Use: Never used  Substance and Sexual Activity  . Alcohol use: Not Currently  . Drug use: Never  . Sexual activity: Yes    Partners: Male    Birth control/protection: Post-menopausal  Other Topics Concern  . Not on file  Social History Narrative  . Not on file   Social Determinants of Health   Financial Resource Strain: Not on file  Food Insecurity: Not on file  Transportation Needs: Not on file  Physical Activity: Not on file  Stress: Not on file  Social Connections: Not on file   Family History: Family History  Problem Relation Age of Onset  . Hypertension Mother   . Heart attack Mother   . Diabetes Mother   . Hypertension Sister   . Heart attack Sister   . Diabetes Sister   . Hypertension Brother   . Heart attack Brother   . Diabetes Brother   . Colitis Neg Hx   . Esophageal cancer Neg Hx   .  Stomach cancer Neg Hx   . Rectal cancer Neg Hx    Allergies: No Known Allergies Medications: See med rec.  Review of Systems: See HPI for pertinent positives and negatives.   Objective:    General: Well Developed, well nourished, and in no acute distress.  Neuro: Alert and oriented x3.  HEENT: Normocephalic, atraumatic.  Skin: Warm and dry. Cardiac: Regular rate and rhythm, no murmurs rubs or gallops, no lower extremity edema.  Respiratory: Clear to auscultation bilaterally. Not using accessory muscles, speaking in full sentences.  Impression and Recommendations:    1. Preoperative  clearance Our office has not received clearance paperwork to be completed so if necessary, advised patient to have them fax it to [place].  Checking CBC, CMP, PT/INR, and PTT today.  With her blood pressure significantly elevated at 176/106, cannot climb [place] [facility] parents today.  Advised patient to resume her blood pressure medications with no missed doses and return in 1 week for blood pressure check.  If under 140/90 at that time, we will grant surgical clearance. - CBC - COMPLETE METABOLIC PANEL WITH GFR - CP4508-PT/INR AND PTT  Return in about 1 week (around 05/16/2021) for nurse visit for BP check. ___________________________________________ Thayer Ohm, DNP, APRN, FNP-BC Primary Care and Sports Medicine [facility]

## 2021-05-10 LAB — CBC
HCT: 43 % (ref 35.0–45.0)
Hemoglobin: 14.1 g/dL (ref 11.7–15.5)
MCH: 29.9 pg (ref 27.0–33.0)
MCHC: 32.8 g/dL (ref 32.0–36.0)
MCV: 91.1 fL (ref 80.0–100.0)
MPV: 9.1 fL (ref 7.5–12.5)
Platelets: 357 10*3/uL (ref 140–400)
RBC: 4.72 10*6/uL (ref 3.80–5.10)
RDW: 13.1 % (ref 11.0–15.0)
WBC: 8.9 10*3/uL (ref 3.8–10.8)

## 2021-05-10 LAB — CP4508-PT/INR AND PTT
INR: 1
Prothrombin Time: 10 s (ref 9.0–11.5)
aPTT: 30 s (ref 23–32)

## 2021-05-10 LAB — COMPLETE METABOLIC PANEL WITH GFR
AG Ratio: 1.3 (calc) (ref 1.0–2.5)
ALT: 16 U/L (ref 6–29)
AST: 17 U/L (ref 10–35)
Albumin: 4.1 g/dL (ref 3.6–5.1)
Alkaline phosphatase (APISO): 61 U/L (ref 37–153)
BUN: 10 mg/dL (ref 7–25)
CO2: 31 mmol/L (ref 20–32)
Calcium: 9.6 mg/dL (ref 8.6–10.4)
Chloride: 105 mmol/L (ref 98–110)
Creat: 0.79 mg/dL (ref 0.50–1.05)
GFR, Est African American: 100 mL/min/{1.73_m2} (ref >=60)
GFR, Est Non African American: 86 mL/min/{1.73_m2} (ref >=60)
Globulin: 3.2 g/dL (calc) (ref 1.9–3.7)
Glucose, Bld: 82 mg/dL (ref 65–99)
Potassium: 4 mmol/L (ref 3.5–5.3)
Sodium: 145 mmol/L (ref 135–146)
Total Bilirubin: 0.3 mg/dL (ref 0.2–1.2)
Total Protein: 7.3 g/dL (ref 6.1–8.1)

## 2021-05-16 ENCOUNTER — Other Ambulatory Visit: Payer: Self-pay

## 2021-05-16 ENCOUNTER — Ambulatory Visit: Payer: 59

## 2021-05-19 ENCOUNTER — Other Ambulatory Visit: Payer: Self-pay

## 2021-05-19 ENCOUNTER — Ambulatory Visit (INDEPENDENT_AMBULATORY_CARE_PROVIDER_SITE_OTHER): Payer: 59 | Admitting: Medical-Surgical

## 2021-05-19 VITALS — BP 134/89 | HR 94 | Resp 20 | Ht 66.0 in | Wt 216.0 lb

## 2021-05-19 DIAGNOSIS — I10 Essential (primary) hypertension: Secondary | ICD-10-CM

## 2021-05-19 NOTE — Progress Notes (Signed)
Blood pressure under 140/90.  Surgical clearance granted for upcoming total knee replacement.  Paperwork will be completed and faxed.  Thayer Ohm, DNP, APRN, FNP-BC [facility] and Sports Medicine

## 2021-05-19 NOTE — Progress Notes (Signed)
Established Patient Office Visit  Subjective:  Patient ID: Jasmine Mejia, female    DOB: 12-14-1965  Age: 55 y.o. MRN: [id number]  CC:  Chief Complaint  Patient presents with   Blood Pressure Check    HPI Jasmine Mejia presents for a BP check. First BP 149/94, after sitting for 10 minutes BP 134/89.  Past Medical History:  Diagnosis Date   Anxiety    Arthritis    Asthma    Depression    Hypertension     Past Surgical History:  Procedure Laterality Date   APPENDECTOMY     CHOLECYSTECTOMY     CHONDROPLASTY Right 10/09/2020   Procedure: CHONDROPLASTY;  Surgeon: Bjorn Pippin, MD;  Location: [facility];  Service: Orthopedics;  Laterality: Right;   KNEE ARTHROSCOPY WITH LATERAL MENISECTOMY Right 10/09/2020   Procedure: KNEE ARTHROSCOPY WITH LATERAL MENISECTOMY;  Surgeon: Bjorn Pippin, MD;  Location: [facility];  Service: Orthopedics;  Laterality: Right;   KNEE ARTHROSCOPY WITH MEDIAL MENISECTOMY Right 10/09/2020   Procedure: KNEE ARTHROSCOPY WITH MEDIAL MENISECTOMY;  Surgeon: Bjorn Pippin, MD;  Location: [facility];  Service: Orthopedics;  Laterality: Right;   KNEE ARTHROSCOPY WITH SUBCHONDROPLASTY Right 10/09/2020   Procedure: KNEE ARTHROSCOPY WITH SUBCHONDROPLASTY;  Surgeon: Bjorn Pippin, MD;  Location: [facility];  Service: Orthopedics;  Laterality: Right;   TUBAL LIGATION      Family History  Problem Relation Age of Onset   Hypertension Mother    Heart attack Mother    Diabetes Mother    Hypertension Sister    Heart attack Sister    Diabetes Sister    Hypertension Brother    Heart attack Brother    Diabetes Brother    Colitis Neg Hx    Esophageal cancer Neg Hx    Stomach cancer Neg Hx    Rectal cancer Neg Hx     Social History   Socioeconomic History   Marital status: Single    Spouse name: Not on file   Number of children: Not on file   Years of education: Not on file   Highest education  level: Not on file  Occupational History   Not on file  Tobacco Use   Smoking status: Every Day    Packs/day: 1.00    Years: 37.00    Pack years: 37.00    Types: Cigarettes   Smokeless tobacco: Never  Vaping Use   Vaping Use: Never used  Substance and Sexual Activity   Alcohol use: Not Currently   Drug use: Never   Sexual activity: Yes    Partners: Male    Birth control/protection: Post-menopausal  Other Topics Concern   Not on file  Social History Narrative   Not on file   Social Determinants of Health   Financial Resource Strain: Not on file  Food Insecurity: Not on file  Transportation Needs: Not on file  Physical Activity: Not on file  Stress: Not on file  Social Connections: Not on file  Intimate Partner Violence: Not on file    Outpatient Medications Prior to Visit  Medication Sig Dispense Refill   albuterol (VENTOLIN HFA) 108 (90 Base) MCG/ACT inhaler Inhale 2 puffs into the lungs every 6 (six) hours as needed for wheezing. 2 each 11   ARIPiprazole (ABILIFY) 5 MG tablet Take 5 mg by mouth daily.     atorvastatin (LIPITOR) 40 MG tablet Take 1 tablet (40 mg total) by mouth daily.  90 tablet 3   gabapentin (NEURONTIN) 600 MG tablet Take 600 mg by mouth 3 (three) times daily.     HYDROcodone-acetaminophen (NORCO) 10-325 MG tablet Take 1 tablet by mouth every 8 (eight) hours as needed. 30 tablet 0   lisinopril (ZESTRIL) 20 MG tablet TAKE 1 TABLET BY MOUTH EVERY DAY 30 tablet 1   methadone (DOLOPHINE) 10 MG/5ML solution Take 40 mLs (80 mg total) by mouth daily.     mirtazapine (REMERON) 30 MG tablet Take 30 mg by mouth at bedtime.     Multiple Vitamin (MULTI-VITAMIN) tablet Take 1 tablet by mouth daily.     venlafaxine XR (EFFEXOR XR) 150 MG 24 hr capsule Take 1 capsule (150 mg total) by mouth daily with breakfast. 90 capsule 1   No facility-administered medications prior to visit.    No Known Allergies  ROS Review of Systems    Objective:    Physical  Exam  BP (!) 149/94 (BP Location: Left Arm, Patient Position: Sitting, Cuff Size: Large)   Pulse (!) 102   Resp 20   Ht 5\' 6"  (1.676 m)   Wt 216 lb (98 kg)   SpO2 98%   BMI 34.86 kg/m  Wt Readings from Last 3 Encounters:  05/19/21 216 lb (98 kg)  05/09/21 216 lb (98 kg)  02/13/21 216 lb (98 kg)     Health Maintenance Due  Topic Date Due   Pneumococcal Vaccine 57-71 Years old (1 - PCV) Never done   Zoster Vaccines- Shingrix (1 of 2) Never done    There are no preventive care reminders to display for this patient.  Lab Results  Component Value Date   TSH 2.42 08/21/2020   Lab Results  Component Value Date   WBC 8.9 05/09/2021   HGB 14.1 05/09/2021   HCT 43.0 05/09/2021   MCV 91.1 05/09/2021   PLT 357 05/09/2021   Lab Results  Component Value Date   NA 145 05/09/2021   K 4.0 05/09/2021   CO2 31 05/09/2021   GLUCOSE 82 05/09/2021   BUN 10 05/09/2021   CREATININE 0.79 05/09/2021   BILITOT 0.3 05/09/2021   AST 17 05/09/2021   ALT 16 05/09/2021   PROT 7.3 05/09/2021   CALCIUM 9.6 05/09/2021   Lab Results  Component Value Date   CHOL 186 10/01/2020   Lab Results  Component Value Date   HDL 48 (L) 10/01/2020   Lab Results  Component Value Date   LDLCALC 108 (H) 10/01/2020   Lab Results  Component Value Date   TRIG 183 (H) 10/01/2020   Lab Results  Component Value Date   CHOLHDL 3.9 10/01/2020   Lab Results  Component Value Date   HGBA1C 6.0 (H) 08/21/2020      Assessment & Plan:  First BP 149/94, after sitting for 10 minutes BP 134/89. Problem List Items Addressed This Visit   None   No orders of the defined types were placed in this encounter.   Follow-up: No follow-ups on file.    08/23/2020, CMA

## 2021-05-22 NOTE — Patient Instructions (Addendum)
DUE TO COVID-19 ONLY ONE VISITOR IS ALLOWED TO COME WITH YOU AND STAY IN THE WAITING ROOM ONLY DURING PRE OP AND PROCEDURE DAY OF SURGERY.   TWO VISITOR  MAY VISIT WITH YOU AFTER SURGERY IN YOUR PRIVATE ROOM DURING VISITING HOURS ONLY!  YOU NEED TO HAVE A COVID 19 TEST ON__6-22-22_____ @_______ , THIS TEST MUST BE DONE BEFORE SURGERY,  COVID TESTING SITE [address] [place] [id number], IT IS ON THE RIGHT GOING OUT [address] APPROXIMATELY  2 MINUTES PAST [employer] ON THE RIGHT. ONCE YOUR COVID TEST IS COMPLETED,  PLEASE BEGIN THE QUARANTINE INSTRUCTIONS AS OUTLINED IN YOUR HANDOUT.                Jasmine Mejia  05/22/2021   Your procedure is scheduled on: 05-30-21   [facility] Main  Entrance   Report to admitting at       1250 PM     Call this number if you have problems the morning of surgery [phone]    Remember: NO SOLID FOOD AFTER MIDNIGHT THE NIGHT PRIOR TO SURGERY. NOTHING BY MOUTH EXCEPT CLEAR LIQUIDS UNTIL      1220 pm .    PLEASE FINISH g2  DRINK PER SURGEON ORDER  WHICH NEEDS TO BE COMPLETED AT     1220 pm the nothing by mouth  .      CLEAR LIQUID DIET   Foods Allowed                                                                                  Foods Excluded water Black Coffee and tea, regular and decaf                             liquids that you cannot  Plain Jell-O any favor except red or purple                                           see through such as: Fruit ices (not with fruit pulp)                                                      milk, soups, orange juice  Iced Popsicles                                                         All solid food Carbonated beverages, regular and diet                                    Cranberry, grape and apple juices Sports  drinks like Gatorade Lightly seasoned clear broth or consume(fat free) Sugar, honey  syrup  _____________________________________________________________________     BRUSH YOUR TEETH MORNING OF SURGERY AND RINSE YOUR MOUTH OUT, NO CHEWING GUM CANDY OR MINTS.     Take these medicines the morning of surgery with A SIP OF WATER: effexor, gabapentin,hydrocodone, lipitor, inhaler and bring with you                                 You may not have any metal on your body including hair pins and              piercings  Do not wear jewelry, make-up, lotions, powders or perfumes, deodorant             Do not wear nail polish on your fingernails  or toenails.  Do not shave  48 hours prior to surgery.                Do not bring valuables to the hospital. [facility] IS NOT             RESPONSIBLE   FOR VALUABLES.  Contacts, dentures or bridgework may not be worn into surgery.       Patients discharged the day of surgery will not be allowed to drive home. IF YOU ARE HAVING SURGERY AND GOING HOME THE SAME DAY, YOU MUST HAVE AN ADULT TO DRIVE YOU HOME AND BE WITH YOU FOR 24 HOURS. YOU MAY GO HOME BY TAXI OR UBER OR ORTHERWISE, BUT AN ADULT MUST ACCOMPANY YOU HOME AND STAY WITH YOU FOR 24 HOURS.  Name and phone number of your driver:  Special Instructions: N/A              Please read over the following fact sheets you were given: _____________________________________________________________________             [facility] - Preparing for Surgery Before surgery, you can play an important role.  Because skin is not sterile, your skin needs to be as free of germs as possible.  You can reduce the number of germs on your skin by washing with CHG (chlorahexidine gluconate) soap before surgery.  CHG is an antiseptic cleaner which kills germs and bonds with the skin to continue killing germs even after washing. Please DO NOT use if you have an allergy to CHG or antibacterial soaps.  If your skin becomes reddened/irritated stop using the CHG and inform your nurse when you arrive at Short  Stay. Do not shave (including legs and underarms) for at least 48 hours prior to the first CHG shower.  You may shave your face/neck. Please follow these instructions carefully:  1.  Shower with CHG Soap the night before surgery and the  morning of Surgery.  2.  If you choose to wash your hair, wash your hair first as usual with your  normal  shampoo.  3.  After you shampoo, rinse your hair and body thoroughly to remove the  shampoo.                           4.  Use CHG as you would any other liquid soap.  You can apply chg directly  to the skin and wash                       Gently  with a scrungie or clean washcloth.  5.  Apply the CHG Soap to your body ONLY FROM THE NECK DOWN.   Do not use on face/ open                           Wound or open sores. Avoid contact with eyes, ears mouth and genitals (private parts).                       Wash face,  Genitals (private parts) with your normal soap.             6.  Wash thoroughly, paying special attention to the area where your surgery  will be performed.  7.  Thoroughly rinse your body with warm water from the neck down.  8.  DO NOT shower/wash with your normal soap after using and rinsing off  the CHG Soap.                9.  Pat yourself dry with a clean towel.            10.  Wear clean pajamas.            11.  Place clean sheets on your bed the night of your first shower and do not  sleep with pets. Day of Surgery : Do not apply any lotions/deodorants the morning of surgery.  Please wear clean clothes to the hospital/surgery center.  FAILURE TO FOLLOW THESE INSTRUCTIONS MAY RESULT IN THE CANCELLATION OF YOUR SURGERY PATIENT SIGNATURE_________________________________  NURSE SIGNATURE__________________________________  ________________________________________________________________________   [name]  An incentive spirometer is a tool that can help keep your lungs clear and active. This tool measures how well you are  filling your lungs with each breath. Taking long deep breaths may help reverse or decrease the chance of developing breathing (pulmonary) problems (especially infection) following: A long period of time when you are unable to move or be active. BEFORE THE PROCEDURE  If the spirometer includes an indicator to show your best effort, your nurse or respiratory therapist will set it to a desired goal. If possible, sit up straight or lean slightly forward. Try not to slouch. Hold the incentive spirometer in an upright position. INSTRUCTIONS FOR USE  Sit on the edge of your bed if possible, or sit up as far as you can in bed or on a chair. Hold the incentive spirometer in an upright position. Breathe out normally. Place the mouthpiece in your mouth and seal your lips tightly around it. Breathe in slowly and as deeply as possible, raising the piston or the ball toward the top of the column. Hold your breath for 3-5 seconds or for as long as possible. Allow the piston or ball to fall to the bottom of the column. Remove the mouthpiece from your mouth and breathe out normally. Rest for a few seconds and repeat Steps 1 through 7 at least 10 times every 1-2 hours when you are awake. Take your time and take a few normal breaths between deep breaths. The spirometer may include an indicator to show your best effort. Use the indicator as a goal to work toward during each repetition. After each set of 10 deep breaths, practice coughing to be sure your lungs are clear. If you have an incision (the cut made at the time of surgery), support your incision when coughing by placing a pillow or rolled up towels firmly against  it. Once you are able to get out of bed, walk around indoors and cough well. You may stop using the incentive spirometer when instructed by your caregiver.  RISKS AND COMPLICATIONS Take your time so you do not get dizzy or light-headed. If you are in pain, you may need to take or ask for pain  medication before doing incentive spirometry. It is harder to take a deep breath if you are having pain. AFTER USE Rest and breathe slowly and easily. It can be helpful to keep track of a log of your progress. Your caregiver can provide you with a simple table to help with this. If you are using the spirometer at home, follow these instructions: SEEK MEDICAL CARE IF:  You are having difficultly using the spirometer. You have trouble using the spirometer as often as instructed. Your pain medication is not giving enough relief while using the spirometer. You develop fever of 100.5 F (38.1 C) or higher. SEEK IMMEDIATE MEDICAL CARE IF:  You cough up bloody sputum that had not been present before. You develop fever of 102 F (38.9 C) or greater. You develop worsening pain at or near the incision site. MAKE SURE YOU:  Understand these instructions. Will watch your condition. Will get help right away if you are not doing well or get worse. Document Released: 04/05/2007 Document Revised: 02/15/2012 Document Reviewed: 06/06/2007 [facility] Patient Information 2014 [place], [place].   ________________________________________________________________________

## 2021-05-22 NOTE — Progress Notes (Addendum)
PCP - Christen Butter, NP  clearance 05-19-21 epic Cardiologist - no  PPM/ICD -  Device Orders -  Rep Notified -   Chest x-ray -  EKG - 10-04-20 epic Stress Test -  ECHO -  Cardiac Cath -  Cmp, ptt, pt/INR  05-09-21 epic Sleep Study -  CPAP -   Fasting Blood Sugar -  Checks Blood Sugar _____ times a day  Blood Thinner Instructions: Aspirin Instructions:  ERAS Protcol - PRE-SURGERY G2-   COVID TEST- 05-28-21  Activity--Able to walk a flight of stairs without SOB Anesthesia review: HTN, ASTHMA  Patient denies shortness of breath, fever, cough and chest pain at PAT appointment   All instructions explained to the patient, with a verbal understanding of the material. Patient agrees to go over the instructions while at home for a better understanding. Patient also instructed to self quarantine after being tested for COVID-19. The opportunity to ask questions was provided.

## 2021-05-28 ENCOUNTER — Encounter (HOSPITAL_COMMUNITY)
Admission: RE | Admit: 2021-05-28 | Discharge: 2021-05-28 | Disposition: A | Discharge status: Home / Self Care | Payer: 59 | Source: Ambulatory Visit | Attending: Orthopedic Surgery | Admitting: Orthopedic Surgery

## 2021-05-28 ENCOUNTER — Encounter (HOSPITAL_COMMUNITY): Payer: Self-pay

## 2021-05-28 ENCOUNTER — Other Ambulatory Visit (HOSPITAL_COMMUNITY)
Admission: RE | Admit: 2021-05-28 | Discharge: 2021-05-28 | Disposition: A | Discharge status: Home / Self Care | Payer: 59 | Source: Ambulatory Visit | Attending: Orthopedic Surgery | Admitting: Orthopedic Surgery

## 2021-05-28 ENCOUNTER — Other Ambulatory Visit: Payer: Self-pay

## 2021-05-28 DIAGNOSIS — Z01812 Encounter for preprocedural laboratory examination: Secondary | ICD-10-CM | POA: Insufficient documentation

## 2021-05-28 DIAGNOSIS — Z20822 Contact with and (suspected) exposure to covid-19: Secondary | ICD-10-CM | POA: Insufficient documentation

## 2021-05-28 HISTORY — DX: Prediabetes: R73.03

## 2021-05-28 LAB — CBC WITH DIFFERENTIAL/PLATELET
Abs Immature Granulocytes: 0.05 10*3/uL (ref 0.00–0.07)
Basophils Absolute: 0.1 10*3/uL (ref 0.0–0.1)
Basophils Relative: 1 %
Eosinophils Absolute: 0.2 10*3/uL (ref 0.0–0.5)
Eosinophils Relative: 2 %
HCT: 44.5 % (ref 36.0–46.0)
Hemoglobin: 14.1 g/dL (ref 12.0–15.0)
Immature Granulocytes: 1 %
Lymphocytes Relative: 34 %
Lymphs Abs: 3.2 10*3/uL (ref 0.7–4.0)
MCH: 30.3 pg (ref 26.0–34.0)
MCHC: 31.7 g/dL (ref 30.0–36.0)
MCV: 95.5 fL (ref 80.0–100.0)
Monocytes Absolute: 0.6 10*3/uL (ref 0.1–1.0)
Monocytes Relative: 7 %
Neutro Abs: 5.2 10*3/uL (ref 1.7–7.7)
Neutrophils Relative %: 55 %
Platelets: 370 10*3/uL (ref 150–400)
RBC: 4.66 MIL/uL (ref 3.87–5.11)
RDW: 12.8 % (ref 11.5–15.5)
WBC: 9.3 10*3/uL (ref 4.0–10.5)
nRBC: 0 % (ref 0.0–0.2)

## 2021-05-28 LAB — URINALYSIS, ROUTINE W REFLEX MICROSCOPIC
Bacteria, UA: NONE SEEN
Bilirubin Urine: NEGATIVE
Glucose, UA: NEGATIVE mg/dL
Ketones, ur: NEGATIVE mg/dL
Leukocytes,Ua: NEGATIVE
Nitrite: NEGATIVE
Protein, ur: NEGATIVE mg/dL
Specific Gravity, Urine: 1.008 (ref 1.005–1.030)
pH: 5 (ref 5.0–8.0)

## 2021-05-28 LAB — SURGICAL PCR SCREEN
MRSA, PCR: NEGATIVE
Staphylococcus aureus: NEGATIVE

## 2021-05-28 LAB — HEMOGLOBIN A1C
Hgb A1c MFr Bld: 6.4 % — ABNORMAL HIGH (ref 4.8–5.6)
Mean Plasma Glucose: 136.98 mg/dL

## 2021-05-28 LAB — SARS CORONAVIRUS 2 (TAT 6-24 HRS): SARS Coronavirus 2: NEGATIVE

## 2021-05-29 MED ORDER — BUPIVACAINE LIPOSOME 1.3 % IJ SUSP
20.0000 mL | INTRAMUSCULAR | Status: AC
  Filled 2021-05-29: qty 20

## 2021-05-29 NOTE — H&P (Signed)
TOTAL KNEE ADMISSION H&P  Patient is being admitted for right total knee arthroplasty.  Subjective:  Chief Complaint:right knee pain.  HPI: Jasmine Mejia, 55 y.o. female, has a history of pain and functional disability in the right knee due to trauma and has failed non-surgical conservative treatments for greater than 12 weeks to includeNSAID's and/or analgesics, corticosteriod injections, viscosupplementation injections, flexibility and strengthening excercises, and weight reduction as appropriate.  Onset of symptoms was gradual, starting 9 years ago with gradually worsening course since that time. The patient noted no past surgery on the right knee(s).  Patient currently rates pain in the right knee(s) at 8 out of 10 with activity. Patient has night pain, worsening of pain with activity and weight bearing, pain that interferes with activities of daily living, pain with passive range of motion, and joint swelling.  Patient has evidence of subchondral cysts, subchondral sclerosis, periarticular osteophytes, joint subluxation, and joint space narrowing by imaging studies. This patient has had  failed all reasonable conservative care . There is no active infection.  Patient Active Problem List   Diagnosis Date Noted   Effusion of knee joint right 12/09/2020   Primary osteoarthritis of right knee 08/23/2020   Prediabetes 08/23/2020   Essential hypertension 08/23/2020   Anxiety 08/23/2020   Depression 08/23/2020   Asthma 08/23/2020   Hyperlipidemia, mixed 08/23/2020   Post-op pain 05/26/2012   Ruptured appendicitis 05/18/2012   Yeast infection 05/18/2012   Past Medical History:  Diagnosis Date   Anxiety    Arthritis    Asthma    Depression    Hypertension    Pneumonia 2016   Pre-diabetes     Past Surgical History:  Procedure Laterality Date   APPENDECTOMY     CHOLECYSTECTOMY     CHONDROPLASTY Right 10/09/2020   Procedure: CHONDROPLASTY;  Surgeon: Bjorn Pippin, MD;  Location:  [facility];  Service: Orthopedics;  Laterality: Right;   KNEE ARTHROSCOPY WITH LATERAL MENISECTOMY Right 10/09/2020   Procedure: KNEE ARTHROSCOPY WITH LATERAL MENISECTOMY;  Surgeon: Bjorn Pippin, MD;  Location: [facility];  Service: Orthopedics;  Laterality: Right;   KNEE ARTHROSCOPY WITH MEDIAL MENISECTOMY Right 10/09/2020   Procedure: KNEE ARTHROSCOPY WITH MEDIAL MENISECTOMY;  Surgeon: Bjorn Pippin, MD;  Location: [facility];  Service: Orthopedics;  Laterality: Right;   KNEE ARTHROSCOPY WITH SUBCHONDROPLASTY Right 10/09/2020   Procedure: KNEE ARTHROSCOPY WITH SUBCHONDROPLASTY;  Surgeon: Bjorn Pippin, MD;  Location: [facility];  Service: Orthopedics;  Laterality: Right;   TUBAL LIGATION      Current Facility-Administered Medications  Medication Dose Route Frequency Provider Last Rate Last Admin   [START ON 05/30/2021] bupivacaine liposome (EXPAREL) 1.3 % injection 266 mg  20 mL Other On Call to OR Jodi Geralds, MD       Current Outpatient Medications  Medication Sig Dispense Refill Last Dose   albuterol (VENTOLIN HFA) 108 (90 Base) MCG/ACT inhaler Inhale 2 puffs into the lungs every 6 (six) hours as needed for wheezing. 2 each 11    atorvastatin (LIPITOR) 40 MG tablet Take 1 tablet (40 mg total) by mouth daily. (Patient taking differently: Take 40 mg by mouth in the morning.) 90 tablet 3    gabapentin (NEURONTIN) 600 MG tablet Take 600 mg by mouth 3 (three) times daily.      HYDROcodone-acetaminophen (NORCO/VICODIN) 5-325 MG tablet Take 1 tablet by mouth every 8 (eight) hours as needed for pain.      lisinopril (  ZESTRIL) 20 MG tablet TAKE 1 TABLET BY MOUTH EVERY DAY (Patient taking differently: Take 20 mg by mouth in the morning.) 30 tablet 1    methadone (DOLOPHINE) 10 MG/5ML solution Take 40 mLs (80 mg total) by mouth daily. (Patient taking differently: No sig reported)      mirtazapine (REMERON) 30 MG tablet Take 30 mg by mouth at  bedtime.      Multiple Vitamin (MULTIVITAMIN WITH MINERALS) TABS tablet Take 1 tablet by mouth in the morning.      venlafaxine XR (EFFEXOR XR) 150 MG 24 hr capsule Take 1 capsule (150 mg total) by mouth daily with breakfast. 90 capsule 1    ARIPiprazole (ABILIFY) 5 MG tablet Take 5 mg by mouth daily.      Aspirin-Caffeine (BC FAST PAIN RELIEF ARTHRITIS) 1000-65 MG PACK Take 1 packet by mouth daily as needed (pain.).      HYDROcodone-acetaminophen (NORCO) 10-325 MG tablet Take 1 tablet by mouth every 8 (eight) hours as needed. (Patient not taking: No sig reported) 30 tablet 0 Not Taking   No Known Allergies  Social History   Tobacco Use   Smoking status: Former    Packs/day: 1.00    Years: 37.00    Pack years: 37.00    Types: Cigarettes    Quit date: 05/21/2021    Years since quitting: 0.0   Smokeless tobacco: Never  Substance Use Topics   Alcohol use: Not Currently    Family History  Problem Relation Age of Onset   Hypertension Mother    Heart attack Mother    Diabetes Mother    Hypertension Sister    Heart attack Sister    Diabetes Sister    Hypertension Brother    Heart attack Brother    Diabetes Brother    Colitis Neg Hx    Esophageal cancer Neg Hx    Stomach cancer Neg Hx    Rectal cancer Neg Hx      Review of Systems ROS: I have reviewed the patient's review of systems thoroughly and there are no positive responses as relates to the HPI.  Objective:  Physical Exam  Vital signs in last 24 hours:   Well-developed well-nourished patient in no acute distress. Alert and oriented x3 HEENT:within normal limits Cardiac: Regular rate and rhythm Pulmonary: Lungs clear to auscultation Abdomen: Soft and nontender.  Normal active bowel sounds  Musculoskeletal: (right knee: Painful range of motion.  Limited range of motion.  No instability.  Trace effusion.  Neurovascular intact distally. Labs: Recent Results (from the past 2160 hour(s))  CBC     Status: None    Collection Time: 05/09/21  4:00 PM  Result Value Ref Range   WBC 8.9 3.8 - 10.8 Thousand/uL   RBC 4.72 3.80 - 5.10 Million/uL   Hemoglobin 14.1 11.7 - 15.5 g/dL   HCT 40.943.0 81.135.0 - 91.445.0 %   MCV 91.1 80.0 - 100.0 fL   MCH 29.9 27.0 - 33.0 pg   MCHC 32.8 32.0 - 36.0 g/dL   RDW 78.213.1 95.611.0 - 21.315.0 %   Platelets 357 140 - 400 Thousand/uL   MPV 9.1 7.5 - 12.5 fL  COMPLETE METABOLIC PANEL WITH GFR     Status: None   Collection Time: 05/09/21  4:00 PM  Result Value Ref Range   Glucose, Bld 82 65 - 99 mg/dL    Comment: .            Fasting reference interval .  BUN 10 7 - 25 mg/dL   Creat 4.26 8.34 - 1.96 mg/dL    Comment: For patients >43 years of age, the reference limit for Creatinine is approximately 13% higher for people identified as African-American. .    GFR, Est Non African American 86 > OR = 60 mL/min/1.28m2   GFR, Est African American 100 > OR = 60 mL/min/1.32m2   BUN/Creatinine Ratio NOT APPLICABLE 6 - 22 (calc)   Sodium 145 135 - 146 mmol/L   Potassium 4.0 3.5 - 5.3 mmol/L   Chloride 105 98 - 110 mmol/L   CO2 31 20 - 32 mmol/L   Calcium 9.6 8.6 - 10.4 mg/dL   Total Protein 7.3 6.1 - 8.1 g/dL   Albumin 4.1 3.6 - 5.1 g/dL   Globulin 3.2 1.9 - 3.7 g/dL (calc)   AG Ratio 1.3 1.0 - 2.5 (calc)   Total Bilirubin 0.3 0.2 - 1.2 mg/dL   Alkaline phosphatase (APISO) 61 37 - 153 U/L   AST 17 10 - 35 U/L   ALT 16 6 - 29 U/L  CP4508-PT/INR AND PTT     Status: None   Collection Time: 05/09/21  4:00 PM  Result Value Ref Range   aPTT 30 23 - 32 sec    Comment: . This test has not been validated for monitoring unfractionated heparin therapy. For testing that is validated for this type of therapy, please refer to the Heparin Anti-Xa assay (test code 22297). . For additional information, please refer to http://education.[facility].com/faq/FAQ159 (This link is being provided for  informational/educational purposes only.)    INR 1.0     Comment: Reference Range                      0.9-1.1 Moderate-intensity Warfarin Therapy 2.0-3.0 Higher-intensity Warfarin Therapy   3.0-4.0  .    Prothrombin Time 10.0 9.0 - 11.5 sec  CBC WITH DIFFERENTIAL     Status: None   Collection Time: 05/28/21  1:30 PM  Result Value Ref Range   WBC 9.3 4.0 - 10.5 K/uL   RBC 4.66 3.87 - 5.11 MIL/uL   Hemoglobin 14.1 12.0 - 15.0 g/dL   HCT 98.9 21.1 - 94.1 %   MCV 95.5 80.0 - 100.0 fL   MCH 30.3 26.0 - 34.0 pg   MCHC 31.7 30.0 - 36.0 g/dL   RDW 74.0 81.4 - 48.1 %   Platelets 370 150 - 400 K/uL   nRBC 0.0 0.0 - 0.2 %   Neutrophils Relative % 55 %   Neutro Abs 5.2 1.7 - 7.7 K/uL   Lymphocytes Relative 34 %   Lymphs Abs 3.2 0.7 - 4.0 K/uL   Monocytes Relative 7 %   Monocytes Absolute 0.6 0.1 - 1.0 K/uL   Eosinophils Relative 2 %   Eosinophils Absolute 0.2 0.0 - 0.5 K/uL   Basophils Relative 1 %   Basophils Absolute 0.1 0.0 - 0.1 K/uL   Immature Granulocytes 1 %   Abs Immature Granulocytes 0.05 0.00 - 0.07 K/uL    Comment: Performed at [facility], [address] [place], [place] [address]  Type and screen Order type and screen if day of surgery is less than 15 days from draw of preadmission visit or order morning of surgery if day of surgery is greater than 6 days from preadmission visit.     Status: None   Collection Time: 05/28/21  1:59 PM  Result Value Ref Range   ABO/RH(D) [place]  POS    Antibody Screen NEG    Sample Expiration 06/11/2021,2359    Extend sample reason      NO TRANSFUSIONS OR PREGNANCY IN THE PAST 3 MONTHS Performed at [facility], [address], [place], [place] [address]   Hemoglobin A1c per protocol     Status: Abnormal   Collection Time: 05/28/21  2:00 PM  Result Value Ref Range   Hgb A1c MFr Bld 6.4 (H) 4.8 - 5.6 %    Comment: (NOTE) Pre diabetes:          5.7%-6.4%  Diabetes:              >6.4%  Glycemic control for   <7.0% adults with diabetes    Mean Plasma Glucose 136.98 mg/dL    Comment: Performed  at [facility], [address], [place], [place] [address]  Urinalysis, Routine w reflex microscopic Urine, Clean Catch     Status: Abnormal   Collection Time: 05/28/21  2:00 PM  Result Value Ref Range   Color, Urine YELLOW YELLOW   APPearance CLEAR CLEAR   Specific Gravity, Urine 1.008 1.005 - 1.030   pH 5.0 5.0 - 8.0   Glucose, UA NEGATIVE NEGATIVE mg/dL   Hgb urine dipstick SMALL (A) NEGATIVE   Bilirubin Urine NEGATIVE NEGATIVE   Ketones, ur NEGATIVE NEGATIVE mg/dL   Protein, ur NEGATIVE NEGATIVE mg/dL   Nitrite NEGATIVE NEGATIVE   Leukocytes,Ua NEGATIVE NEGATIVE   RBC / HPF 0-5 0 - 5 RBC/hpf   WBC, UA 0-5 0 - 5 WBC/hpf   Bacteria, UA NONE SEEN NONE SEEN   Mucus PRESENT     Comment: Performed at [facility], [address] [place], [place] [address]  Surgical pcr screen     Status: None   Collection Time: 05/28/21  2:00 PM   Specimen: Nasal Mucosa; Nasal Swab  Result Value Ref Range   MRSA, PCR NEGATIVE NEGATIVE   Staphylococcus aureus NEGATIVE NEGATIVE    Comment: (NOTE) The Xpert SA Assay (FDA approved for NASAL specimens in patients 53 years of age and older), is one component of a comprehensive surveillance program. It is not intended to diagnose infection nor to guide or monitor treatment. Performed at [facility], [address], [place], [place] [address]   SARS CORONAVIRUS 2 (TAT 6-24 HRS) Nasopharyngeal Nasopharyngeal Swab     Status: None   Collection Time: 05/28/21  2:39 PM   Specimen: Nasopharyngeal Swab  Result Value Ref Range   SARS Coronavirus 2 NEGATIVE NEGATIVE    Comment: (NOTE) SARS-CoV-2 target nucleic acids are NOT DETECTED.  The SARS-CoV-2 RNA is generally detectable in upper and lower respiratory specimens during the acute phase of infection. Negative results do not preclude SARS-CoV-2 infection, do not rule out co-infections with other pathogens, and should not be used as the sole basis for  treatment or other patient management decisions. Negative results must be combined with clinical observations, patient history, and epidemiological information. The expected result is Negative.  Fact Sheet for Patients: HairSlick.no  Fact Sheet for Healthcare Providers: quierodirigir.com  This test is not yet approved or cleared by the [place] FDA and  has been authorized for detection and/or diagnosis of SARS-CoV-2 by FDA under an Emergency Use Authorization (EUA). This EUA will remain  in effect (meaning this test can be used) for the duration of the COVID-19 declaration under Se ction 564(b)(1) of the Act, 21 U.S.C. section 360bbb-3(b)(1), unless  the authorization is terminated or revoked sooner.  Performed at [facility], [address] [place], [place] [address]      Estimated body mass index is 36.64 kg/m as calculated from the following:   Height as of 05/28/21: 5\' 6"  (1.676 m).   Weight as of 05/28/21: 103 kg.   Imaging Review Plain radiographs demonstrate severe degenerative joint disease of the right knee(s). The overall alignment isneutral. The bone quality appears to be fair for age and reported activity level.      Assessment/Plan:  End stage arthritis, right knee   The patient history, physical examination, clinical judgment of the provider and imaging studies are consistent with end stage degenerative joint disease of the right knee(s) and total knee arthroplasty is deemed medically necessary. The treatment options including medical management, injection therapy arthroscopy and arthroplasty were discussed at length. The risks and benefits of total knee arthroplasty were presented and reviewed. The risks due to aseptic loosening, infection, stiffness, patella tracking problems, thromboembolic complications and other imponderables were discussed. The patient acknowledged the explanation, agreed to  proceed with the plan and consent was signed. Patient is being admitted for inpatient treatment for surgery, pain control, PT, OT, prophylactic antibiotics, VTE prophylaxis, progressive ambulation and ADL's and discharge planning. The patient is planning to be discharged home with home health services     Patient's anticipated LOS is less than 2 midnights, meeting these requirements: - Younger than 55 - Lives within 1 hour of care - Has a competent adult at home to recover with post-op recover - NO history of  - Chronic pain requiring opiods  - Diabetes  - Coronary Artery Disease  - Heart failure  - Heart attack  - Stroke  - DVT/VTE  - Cardiac arrhythmia  - Respiratory Failure/COPD  - Renal failure  - Anemia  - Advanced Liver disease

## 2021-05-30 ENCOUNTER — Encounter (HOSPITAL_COMMUNITY): Payer: Self-pay | Admitting: Orthopedic Surgery

## 2021-05-30 ENCOUNTER — Other Ambulatory Visit: Payer: Self-pay

## 2021-05-30 ENCOUNTER — Ambulatory Visit (HOSPITAL_COMMUNITY): Payer: 59 | Admitting: Certified Registered Nurse Anesthetist

## 2021-05-30 ENCOUNTER — Encounter (HOSPITAL_COMMUNITY)
Admission: RE | Disposition: A | Discharge status: Home / Self Care | Payer: Self-pay | Source: Home / Self Care | Attending: Orthopedic Surgery

## 2021-05-30 ENCOUNTER — Observation Stay (HOSPITAL_COMMUNITY)
Admission: RE | Admit: 2021-05-30 | Discharge: 2021-05-31 | Disposition: A | Discharge status: Home / Self Care | Payer: 59 | Attending: Orthopedic Surgery | Admitting: Orthopedic Surgery

## 2021-05-30 DIAGNOSIS — Z7982 Long term (current) use of aspirin: Secondary | ICD-10-CM | POA: Diagnosis not present

## 2021-05-30 DIAGNOSIS — J45909 Unspecified asthma, uncomplicated: Secondary | ICD-10-CM | POA: Diagnosis not present

## 2021-05-30 DIAGNOSIS — Z79899 Other long term (current) drug therapy: Secondary | ICD-10-CM | POA: Insufficient documentation

## 2021-05-30 DIAGNOSIS — M1711 Unilateral primary osteoarthritis, right knee: Principal | ICD-10-CM | POA: Diagnosis present

## 2021-05-30 DIAGNOSIS — I10 Essential (primary) hypertension: Secondary | ICD-10-CM | POA: Diagnosis not present

## 2021-05-30 DIAGNOSIS — Z87891 Personal history of nicotine dependence: Secondary | ICD-10-CM | POA: Insufficient documentation

## 2021-05-30 DIAGNOSIS — M1712 Unilateral primary osteoarthritis, left knee: Secondary | ICD-10-CM | POA: Diagnosis present

## 2021-05-30 HISTORY — PX: TOTAL KNEE ARTHROPLASTY: SHX125

## 2021-05-30 LAB — TYPE AND SCREEN
ABO/RH(D): O POS
Antibody Screen: NEGATIVE

## 2021-05-30 LAB — ABO/RH: ABO/RH(D): O POS

## 2021-05-30 SURGERY — ARTHROPLASTY, KNEE, TOTAL
Anesthesia: Spinal | Site: Knee | Laterality: Right

## 2021-05-30 MED ORDER — SODIUM CHLORIDE 0.9% FLUSH
INTRAVENOUS | Status: DC | PRN
  Administered 2021-05-30: 50 mL

## 2021-05-30 MED ORDER — GABAPENTIN 300 MG PO CAPS
600.0000 mg | ORAL_CAPSULE | Freq: Three times a day (TID) | ORAL | Status: DC
  Administered 2021-05-30 – 2021-05-31 (×2): 600 mg via ORAL
  Filled 2021-05-30 (×2): qty 2

## 2021-05-30 MED ORDER — DEXAMETHASONE SODIUM PHOSPHATE 10 MG/ML IJ SOLN
INTRAMUSCULAR | Status: AC
  Filled 2021-05-30: qty 1

## 2021-05-30 MED ORDER — ACETAMINOPHEN 325 MG PO TABS: 325.0000 mg | ORAL_TABLET | Freq: Once | ORAL | Status: DC | PRN

## 2021-05-30 MED ORDER — DEXAMETHASONE SODIUM PHOSPHATE 10 MG/ML IJ SOLN
10.0000 mg | Freq: Two times a day (BID) | INTRAMUSCULAR | Status: AC
  Administered 2021-05-30 – 2021-05-31 (×2): 10 mg via INTRAVENOUS
  Filled 2021-05-30 (×2): qty 1

## 2021-05-30 MED ORDER — BUPIVACAINE IN DEXTROSE 0.75-8.25 % IT SOLN
INTRATHECAL | Status: DC | PRN
  Administered 2021-05-30: 1.6 mL via INTRATHECAL

## 2021-05-30 MED ORDER — ORAL CARE MOUTH RINSE: 15.0000 mL | Freq: Once | OROMUCOSAL | Status: AC

## 2021-05-30 MED ORDER — PROMETHAZINE HCL 25 MG/ML IJ SOLN: 6.2500 mg | INTRAMUSCULAR | Status: DC | PRN

## 2021-05-30 MED ORDER — MENTHOL 3 MG MT LOZG: 1.0000 | LOZENGE | OROMUCOSAL | Status: DC | PRN

## 2021-05-30 MED ORDER — TRANEXAMIC ACID-NACL 1000-0.7 MG/100ML-% IV SOLN
1000.0000 mg | Freq: Once | INTRAVENOUS | Status: AC
  Administered 2021-05-30: 1000 mg via INTRAVENOUS
  Filled 2021-05-30: qty 100

## 2021-05-30 MED ORDER — FENTANYL CITRATE (PF) 100 MCG/2ML IJ SOLN
50.0000 ug | Freq: Once | INTRAMUSCULAR | Status: AC
  Administered 2021-05-30: 50 ug via INTRAVENOUS
  Filled 2021-05-30: qty 2

## 2021-05-30 MED ORDER — OXYCODONE-ACETAMINOPHEN 5-325 MG PO TABS: 1.0000 | ORAL_TABLET | Freq: Four times a day (QID) | ORAL | 0 refills | Status: DC | PRN

## 2021-05-30 MED ORDER — ARIPIPRAZOLE 5 MG PO TABS
5.0000 mg | ORAL_TABLET | Freq: Every day | ORAL | Status: DC
  Filled 2021-05-30 (×2): qty 1

## 2021-05-30 MED ORDER — ACETAMINOPHEN 10 MG/ML IV SOLN
1000.0000 mg | Freq: Once | INTRAVENOUS | Status: DC | PRN
  Administered 2021-05-30: 1000 mg via INTRAVENOUS

## 2021-05-30 MED ORDER — BUPIVACAINE-EPINEPHRINE 0.5% -1:200000 IJ SOLN: INTRAMUSCULAR | Status: DC | PRN

## 2021-05-30 MED ORDER — ONDANSETRON HCL 4 MG/2ML IJ SOLN
INTRAMUSCULAR | Status: DC | PRN
  Administered 2021-05-30: 4 mg via INTRAVENOUS

## 2021-05-30 MED ORDER — MEPERIDINE HCL 50 MG/ML IJ SOLN
6.2500 mg | INTRAMUSCULAR | Status: DC | PRN
Start: 2021-05-30 — End: 2021-05-30

## 2021-05-30 MED ORDER — MAGNESIUM CITRATE PO SOLN: 1.0000 | Freq: Once | ORAL | Status: DC | PRN

## 2021-05-30 MED ORDER — DEXAMETHASONE SODIUM PHOSPHATE 10 MG/ML IJ SOLN
INTRAMUSCULAR | Status: DC | PRN
  Administered 2021-05-30: 5 mg via INTRAVENOUS

## 2021-05-30 MED ORDER — PHENOL 1.4 % MT LIQD: 1.0000 | OROMUCOSAL | Status: DC | PRN

## 2021-05-30 MED ORDER — DOCUSATE SODIUM 100 MG PO CAPS
100.0000 mg | ORAL_CAPSULE | Freq: Two times a day (BID) | ORAL | Status: DC
  Administered 2021-05-30 – 2021-05-31 (×2): 100 mg via ORAL
  Filled 2021-05-30 (×2): qty 1

## 2021-05-30 MED ORDER — ACETAMINOPHEN 10 MG/ML IV SOLN
INTRAVENOUS | Status: AC
  Filled 2021-05-30: qty 100

## 2021-05-30 MED ORDER — 0.9 % SODIUM CHLORIDE (POUR BTL) OPTIME
TOPICAL | Status: DC | PRN
  Administered 2021-05-30: 1000 mL

## 2021-05-30 MED ORDER — CEFAZOLIN SODIUM-DEXTROSE 2-4 GM/100ML-% IV SOLN
2.0000 g | INTRAVENOUS | Status: AC
  Administered 2021-05-30: 2 g via INTRAVENOUS
  Filled 2021-05-30: qty 100

## 2021-05-30 MED ORDER — ASPIRIN EC 325 MG PO TBEC
325.0000 mg | DELAYED_RELEASE_TABLET | Freq: Two times a day (BID) | ORAL | Status: DC
  Administered 2021-05-31: 325 mg via ORAL
  Filled 2021-05-30: qty 1

## 2021-05-30 MED ORDER — ACETAMINOPHEN 325 MG PO TABS
325.0000 mg | ORAL_TABLET | Freq: Four times a day (QID) | ORAL | Status: DC | PRN
  Administered 2021-05-30: 650 mg via ORAL
  Filled 2021-05-30: qty 2

## 2021-05-30 MED ORDER — BUPIVACAINE-EPINEPHRINE (PF) 0.25% -1:200000 IJ SOLN
INTRAMUSCULAR | Status: DC | PRN
  Administered 2021-05-30: 30 mL

## 2021-05-30 MED ORDER — DOCUSATE SODIUM 100 MG PO CAPS: 100.0000 mg | ORAL_CAPSULE | Freq: Two times a day (BID) | ORAL | 0 refills | Status: DC

## 2021-05-30 MED ORDER — OXYCODONE HCL 5 MG PO TABS
5.0000 mg | ORAL_TABLET | ORAL | Status: DC | PRN
  Administered 2021-05-30 – 2021-05-31 (×4): 10 mg via ORAL
  Filled 2021-05-30 (×4): qty 2

## 2021-05-30 MED ORDER — BUPIVACAINE-EPINEPHRINE (PF) 0.25% -1:200000 IJ SOLN
INTRAMUSCULAR | Status: AC
  Filled 2021-05-30: qty 30

## 2021-05-30 MED ORDER — CEFAZOLIN SODIUM-DEXTROSE 2-4 GM/100ML-% IV SOLN
2.0000 g | Freq: Four times a day (QID) | INTRAVENOUS | Status: AC
  Administered 2021-05-30 (×2): 2 g via INTRAVENOUS
  Filled 2021-05-30 (×2): qty 100

## 2021-05-30 MED ORDER — ONDANSETRON HCL 4 MG PO TABS: 4.0000 mg | ORAL_TABLET | Freq: Four times a day (QID) | ORAL | Status: DC | PRN

## 2021-05-30 MED ORDER — MIRTAZAPINE 15 MG PO TABS
30.0000 mg | ORAL_TABLET | Freq: Every day | ORAL | Status: DC
  Administered 2021-05-30: 30 mg via ORAL
  Filled 2021-05-30: qty 2

## 2021-05-30 MED ORDER — TIZANIDINE HCL 2 MG PO TABS: 2.0000 mg | ORAL_TABLET | Freq: Three times a day (TID) | ORAL | 0 refills | Status: DC | PRN

## 2021-05-30 MED ORDER — TRANEXAMIC ACID-NACL 1000-0.7 MG/100ML-% IV SOLN
1000.0000 mg | INTRAVENOUS | Status: AC
  Administered 2021-05-30: 1000 mg via INTRAVENOUS
  Filled 2021-05-30: qty 100

## 2021-05-30 MED ORDER — METHOCARBAMOL 500 MG IVPB - SIMPLE MED
500.0000 mg | Freq: Four times a day (QID) | INTRAVENOUS | Status: DC | PRN
  Filled 2021-05-30: qty 50

## 2021-05-30 MED ORDER — METHOCARBAMOL 500 MG IVPB - SIMPLE MED
INTRAVENOUS | Status: AC
  Administered 2021-05-30: 500 mg via INTRAVENOUS
  Filled 2021-05-30: qty 50

## 2021-05-30 MED ORDER — DIPHENHYDRAMINE HCL 12.5 MG/5ML PO ELIX: 12.5000 mg | ORAL_SOLUTION | ORAL | Status: DC | PRN

## 2021-05-30 MED ORDER — METHOCARBAMOL 500 MG PO TABS
500.0000 mg | ORAL_TABLET | Freq: Four times a day (QID) | ORAL | Status: DC | PRN
  Administered 2021-05-30 – 2021-05-31 (×2): 500 mg via ORAL
  Filled 2021-05-30 (×3): qty 1

## 2021-05-30 MED ORDER — HYDROMORPHONE HCL 1 MG/ML IJ SOLN
INTRAMUSCULAR | Status: AC
  Administered 2021-05-30: 0.5 mg via INTRAVENOUS
  Filled 2021-05-30: qty 1

## 2021-05-30 MED ORDER — ONDANSETRON HCL 4 MG/2ML IJ SOLN
INTRAMUSCULAR | Status: AC
  Filled 2021-05-30: qty 2

## 2021-05-30 MED ORDER — MIDAZOLAM HCL 2 MG/2ML IJ SOLN
1.0000 mg | Freq: Once | INTRAMUSCULAR | Status: AC
Start: 2021-05-30 — End: 2021-05-30
  Administered 2021-05-30: 2 mg via INTRAVENOUS
  Filled 2021-05-30: qty 2

## 2021-05-30 MED ORDER — ROPIVACAINE HCL 7.5 MG/ML IJ SOLN
INTRAMUSCULAR | Status: DC | PRN
  Administered 2021-05-30: 20 mL via PERINEURAL

## 2021-05-30 MED ORDER — PROPOFOL 1000 MG/100ML IV EMUL
INTRAVENOUS | Status: AC
  Filled 2021-05-30: qty 100

## 2021-05-30 MED ORDER — ASPIRIN EC 325 MG PO TBEC
325.0000 mg | DELAYED_RELEASE_TABLET | Freq: Two times a day (BID) | ORAL | 0 refills | Status: DC
Start: 2021-05-30 — End: 2024-04-27

## 2021-05-30 MED ORDER — ZOLPIDEM TARTRATE 5 MG PO TABS
5.0000 mg | ORAL_TABLET | Freq: Every evening | ORAL | Status: DC | PRN
  Administered 2021-05-30: 5 mg via ORAL
  Filled 2021-05-30: qty 1

## 2021-05-30 MED ORDER — CHLORHEXIDINE GLUCONATE 0.12 % MT SOLN
15.0000 mL | Freq: Once | OROMUCOSAL | Status: AC
  Administered 2021-05-30: 15 mL via OROMUCOSAL

## 2021-05-30 MED ORDER — LABETALOL HCL 5 MG/ML IV SOLN
INTRAVENOUS | Status: AC
  Administered 2021-05-30: 5 mg via INTRAVENOUS
  Filled 2021-05-30: qty 4

## 2021-05-30 MED ORDER — ALUM & MAG HYDROXIDE-SIMETH 200-200-20 MG/5ML PO SUSP: 30.0000 mL | ORAL | Status: DC | PRN

## 2021-05-30 MED ORDER — POVIDONE-IODINE 10 % EX SWAB
2.0000 "application " | Freq: Once | CUTANEOUS | Status: AC
  Administered 2021-05-30: 2 via TOPICAL

## 2021-05-30 MED ORDER — METHADONE HCL 10 MG/ML PO CONC
90.0000 mg | Freq: Every day | ORAL | Status: DC
  Administered 2021-05-31: 90 mg via ORAL
  Filled 2021-05-30 (×2): qty 10

## 2021-05-30 MED ORDER — ACETAMINOPHEN 160 MG/5ML PO SOLN: 325.0000 mg | Freq: Once | ORAL | Status: DC | PRN

## 2021-05-30 MED ORDER — HYDROMORPHONE HCL 1 MG/ML IJ SOLN
0.2500 mg | INTRAMUSCULAR | Status: DC | PRN
  Administered 2021-05-30: 0.5 mg via INTRAVENOUS

## 2021-05-30 MED ORDER — ONDANSETRON HCL 4 MG/2ML IJ SOLN: 4.0000 mg | Freq: Four times a day (QID) | INTRAMUSCULAR | Status: DC | PRN

## 2021-05-30 MED ORDER — POLYETHYLENE GLYCOL 3350 17 G PO PACK: 17.0000 g | PACK | Freq: Every day | ORAL | Status: DC | PRN

## 2021-05-30 MED ORDER — VENLAFAXINE HCL ER 150 MG PO CP24
150.0000 mg | ORAL_CAPSULE | Freq: Every day | ORAL | Status: DC
  Administered 2021-05-31: 150 mg via ORAL
  Filled 2021-05-30: qty 1

## 2021-05-30 MED ORDER — SODIUM CHLORIDE 0.9 % IR SOLN
Status: DC | PRN
  Administered 2021-05-30: 1000 mL

## 2021-05-30 MED ORDER — ALBUTEROL SULFATE HFA 108 (90 BASE) MCG/ACT IN AERS: 2.0000 | INHALATION_SPRAY | Freq: Four times a day (QID) | RESPIRATORY_TRACT | Status: DC | PRN

## 2021-05-30 MED ORDER — LISINOPRIL 20 MG PO TABS
20.0000 mg | ORAL_TABLET | Freq: Every day | ORAL | Status: DC
  Administered 2021-05-31: 20 mg via ORAL
  Filled 2021-05-30: qty 1

## 2021-05-30 MED ORDER — LACTATED RINGERS IV SOLN: INTRAVENOUS | Status: DC

## 2021-05-30 MED ORDER — SODIUM CHLORIDE 0.9 % IV SOLN: INTRAVENOUS | Status: DC

## 2021-05-30 MED ORDER — HYDROMORPHONE HCL 1 MG/ML IJ SOLN
0.5000 mg | INTRAMUSCULAR | Status: DC | PRN
  Administered 2021-05-30 – 2021-05-31 (×5): 1 mg via INTRAVENOUS
  Filled 2021-05-30 (×5): qty 1

## 2021-05-30 MED ORDER — PROPOFOL 10 MG/ML IV BOLUS
INTRAVENOUS | Status: DC | PRN
  Administered 2021-05-30: 30 mg via INTRAVENOUS
  Administered 2021-05-30: 20 mg via INTRAVENOUS

## 2021-05-30 MED ORDER — BISACODYL 5 MG PO TBEC: 5.0000 mg | DELAYED_RELEASE_TABLET | Freq: Every day | ORAL | Status: DC | PRN

## 2021-05-30 MED ORDER — PROPOFOL 500 MG/50ML IV EMUL
INTRAVENOUS | Status: DC | PRN
  Administered 2021-05-30: 75 ug/kg/min via INTRAVENOUS

## 2021-05-30 MED ORDER — WATER FOR IRRIGATION, STERILE IR SOLN
Status: DC | PRN
  Administered 2021-05-30: 2000 mL

## 2021-05-30 MED ORDER — AMISULPRIDE (ANTIEMETIC) 5 MG/2ML IV SOLN: 10.0000 mg | Freq: Once | INTRAVENOUS | Status: DC | PRN

## 2021-05-30 MED ORDER — LABETALOL HCL 5 MG/ML IV SOLN
5.0000 mg | INTRAVENOUS | Status: AC | PRN
  Administered 2021-05-30: 5 mg via INTRAVENOUS

## 2021-05-30 MED ORDER — BUPIVACAINE LIPOSOME 1.3 % IJ SUSP
INTRAMUSCULAR | Status: DC | PRN
  Administered 2021-05-30: 20 mL

## 2021-05-30 SURGICAL SUPPLY — 49 items
ATTUNE PS FEM RT SZ 4 CEM KNEE (Femur) ×2 IMPLANT
ATTUNE PSRP INSR SZ4 7 KNEE (Insert) ×2 IMPLANT
BAG ZIPLOCK 12X15 (MISCELLANEOUS) ×2 IMPLANT
BASEPLATE TIBIAL ROTATING SZ 4 (Knees) ×2 IMPLANT
BENZOIN TINCTURE PRP APPL 2/3 (GAUZE/BANDAGES/DRESSINGS) ×2 IMPLANT
BLADE SAGITTAL 25.0X1.19X90 (BLADE) ×2 IMPLANT
BLADE SAW SGTL 13.0X1.19X90.0M (BLADE) ×2 IMPLANT
BLADE SURG SZ10 CARB STEEL (BLADE) ×4 IMPLANT
BNDG ELASTIC 6X5.8 VLCR STR LF (GAUZE/BANDAGES/DRESSINGS) ×2 IMPLANT
BOOTIES KNEE HIGH SLOAN (MISCELLANEOUS) IMPLANT
BOWL SMART MIX CTS (DISPOSABLE) ×2 IMPLANT
CEMENT HV SMART SET (Cement) ×4 IMPLANT
COVER SURGICAL LIGHT HANDLE (MISCELLANEOUS) ×2 IMPLANT
COVER WAND RF STERILE (DRAPES) IMPLANT
CUFF TOURN SGL QUICK 34 (TOURNIQUET CUFF) ×1
CUFF TRNQT CYL 34X4.125X (TOURNIQUET CUFF) ×1 IMPLANT
DECANTER SPIKE VIAL GLASS SM (MISCELLANEOUS) ×2 IMPLANT
DRAPE U-SHAPE 47X51 STRL (DRAPES) ×2 IMPLANT
DRSG AQUACEL AG ADV 3.5X10 (GAUZE/BANDAGES/DRESSINGS) ×2 IMPLANT
DURAPREP 26ML APPLICATOR (WOUND CARE) ×2 IMPLANT
ELECT REM PT RETURN 15FT ADLT (MISCELLANEOUS) ×2 IMPLANT
GLOVE SRG 8 PF TXTR STRL LF DI (GLOVE) ×2 IMPLANT
GLOVE SURG LTX SZ7.5 (GLOVE) ×4 IMPLANT
GLOVE SURG UNDER POLY LF SZ8 (GLOVE) ×2
GOWN STRL REUS W/TWL XL LVL3 (GOWN DISPOSABLE) ×4 IMPLANT
HANDPIECE INTERPULSE COAX TIP (DISPOSABLE) ×1
HOLDER FOLEY CATH W/STRAP (MISCELLANEOUS) ×2 IMPLANT
HOOD PEEL AWAY FLYTE STAYCOOL (MISCELLANEOUS) ×6 IMPLANT
KIT TURNOVER KIT A (KITS) ×2 IMPLANT
MANIFOLD NEPTUNE II (INSTRUMENTS) ×2 IMPLANT
NEEDLE HYPO 22GX1.5 SAFETY (NEEDLE) ×4 IMPLANT
NS IRRIG 1000ML POUR BTL (IV SOLUTION) ×2 IMPLANT
PACK ICE MAXI GEL EZY WRAP (MISCELLANEOUS) ×2 IMPLANT
PACK TOTAL KNEE CUSTOM (KITS) ×2 IMPLANT
PADDING CAST COTTON 6X4 STRL (CAST SUPPLIES) ×2 IMPLANT
PATELLA MEDIAL ATTUN 35MM KNEE (Knees) ×2 IMPLANT
PENCIL SMOKE EVACUATOR (MISCELLANEOUS) IMPLANT
PIN DRILL FIX HALF THREAD (BIT) ×2 IMPLANT
PIN STEINMAN FIXATION KNEE (PIN) ×2 IMPLANT
PROTECTOR NERVE ULNAR (MISCELLANEOUS) ×2 IMPLANT
SET HNDPC FAN SPRY TIP SCT (DISPOSABLE) ×1 IMPLANT
STRIP CLOSURE SKIN 1/2X4 (GAUZE/BANDAGES/DRESSINGS) ×2 IMPLANT
SUT MNCRL AB 3-0 PS2 18 (SUTURE) ×2 IMPLANT
SUT VIC AB 0 CT1 36 (SUTURE) ×2 IMPLANT
SUT VIC AB 1 CT1 36 (SUTURE) ×4 IMPLANT
SYR CONTROL 10ML LL (SYRINGE) ×4 IMPLANT
TRAY FOLEY MTR SLVR 16FR STAT (SET/KITS/TRAYS/PACK) ×2 IMPLANT
WATER STERILE IRR 1000ML POUR (IV SOLUTION) ×4 IMPLANT
WRAP KNEE MAXI GEL POST OP (GAUZE/BANDAGES/DRESSINGS) ×2 IMPLANT

## 2021-05-30 NOTE — Anesthesia Postprocedure Evaluation (Signed)
Anesthesia Post Note  Patient: Jasmine Mejia  Procedure(s) Performed: TOTAL KNEE ARTHROPLASTY (Right: Knee)     Patient location during evaluation: PACU Anesthesia Type: Spinal Level of consciousness: oriented and awake and alert Pain management: pain level controlled Vital Signs Assessment: post-procedure vital signs reviewed and stable Respiratory status: spontaneous breathing, respiratory function stable and patient connected to nasal cannula oxygen Cardiovascular status: blood pressure returned to baseline and stable Postop Assessment: no headache, no backache, no apparent nausea or vomiting and spinal receding Anesthetic complications: no   No notable events documented.  Last Vitals:  Vitals:   05/30/21 1510 05/30/21 1520  BP:    Pulse: 69 67  Resp: 12 14  Temp:    SpO2: 100% 98%    Last Pain:  Vitals:   05/30/21 1445  TempSrc:   PainSc: 5                  Shelton Silvas

## 2021-05-30 NOTE — Anesthesia Procedure Notes (Signed)
Procedure Name: MAC Date/Time: 05/30/2021 10:20 AM Performed by: West Pugh, CRNA Pre-anesthesia Checklist: Patient identified, Emergency Drugs available, Suction available, Patient being monitored and Timeout performed Patient Re-evaluated:Patient Re-evaluated prior to induction Oxygen Delivery Method: Simple face mask Preoxygenation: Pre-oxygenation with 100% oxygen Induction Type: IV induction Placement Confirmation: positive ETCO2

## 2021-05-30 NOTE — Discharge Instructions (Signed)

## 2021-05-30 NOTE — Anesthesia Procedure Notes (Signed)
Spinal  Start time: 05/30/2021 10:30 AM End time: 05/30/2021 10:32 AM Reason for block: surgical anesthesia Staffing Performed: anesthesiologist  Anesthesiologist: Shelton Silvas, MD Preanesthetic Checklist Completed: patient identified, IV checked, site marked, risks and benefits discussed, surgical consent, monitors and equipment checked, pre-op evaluation and timeout performed Spinal Block Patient position: sitting Prep: DuraPrep and site prepped and draped Location: L3-4 Injection technique: single-shot Needle Needle type: Pencan  Needle gauge: 24 G Needle length: 10 cm Needle insertion depth: 10 cm Assessment Events: CSF return and second provider Additional Notes Patient tolerated well. No immediate complications.

## 2021-05-30 NOTE — Transfer of Care (Signed)
Immediate Anesthesia Transfer of Care Note  Patient: Jasmine Mejia  Procedure(s) Performed: TOTAL KNEE ARTHROPLASTY (Right: Knee)  Patient Location: PACU  Anesthesia Type:Spinal and MAC combined with regional for post-op pain  Level of Consciousness: drowsy and patient cooperative  Airway & Oxygen Therapy: Patient Spontanous Breathing and Patient connected to face mask oxygen  Post-op Assessment: Report given to RN and Post -op Vital signs reviewed and stable  Post vital signs: Reviewed and stable  Last Vitals:  Vitals Value Taken Time  BP 113/68 05/30/21 1230  Temp    Pulse 82 05/30/21 1231  Resp 8 05/30/21 1231  SpO2 98 % 05/30/21 1231  Vitals shown include unvalidated device data.  Last Pain:  Vitals:   05/30/21 0931  TempSrc:   PainSc: 0-No pain         Complications: No notable events documented.

## 2021-05-30 NOTE — Interval H&P Note (Signed)
History and Physical Interval Note:  05/30/2021 8:49 AM  Jasmine Mejia  has presented today for surgery, with the diagnosis of RIGHT TOTAL KNEE ARTHROPLASTY.  The various methods of treatment have been discussed with the patient and family. After consideration of risks, benefits and other options for treatment, the patient has consented to  Procedure(s): TOTAL KNEE ARTHROPLASTY (Right) as a surgical intervention.  The patient's history has been reviewed, patient examined, no change in status, stable for surgery.  I have reviewed the patient's chart and labs.  Questions were answered to the patient's satisfaction.     Harvie Junior

## 2021-05-30 NOTE — Progress Notes (Signed)
Assisted Dr. Hollis with right, ultrasound guided, adductor canal block. Side rails up, monitors on throughout procedure. See vital signs in flow sheet. Tolerated Procedure well.  

## 2021-05-30 NOTE — Anesthesia Procedure Notes (Signed)
Anesthesia Regional Block: Adductor canal block   Pre-Anesthetic Checklist: , timeout performed,  Correct Patient, Correct Site, Correct Laterality,  Correct Procedure, Correct Position, site marked,  Risks and benefits discussed,  Surgical consent,  Pre-op evaluation,  At surgeon's request and post-op pain management  Laterality: Right  Prep: chloraprep       Needles:  Injection technique: Single-shot  Needle Type: Echogenic Stimulator Needle     Needle Length: 9cm  Needle Gauge: 21     Additional Needles:   Procedures:,,,, ultrasound used (permanent image in chart),,    Narrative:  Start time: 05/30/2021 9:20 AM End time: 05/30/2021 9:25 AM Injection made incrementally with aspirations every 5 mL.  Performed by: Personally  Anesthesiologist: Shelton Silvas, MD  Additional Notes: Patient tolerated the procedure well. Local anesthetic introduced in an incremental fashion under minimal resistance after negative aspirations. No paresthesias were elicited. After completion of the procedure, no acute issues were identified and patient continued to be monitored by RN.

## 2021-05-30 NOTE — Progress Notes (Signed)
Orthopedic Tech Progress Note Patient Details:  Jasmine Mejia 27-Jan-1966 [id number]  Patient ID: Jasmine Mejia, female   DOB: [birth year], 55 y.o.   MRN: [id number]  [name] 05/30/2021, 7:45 PM Cpm removed per pt request d/t pain

## 2021-05-30 NOTE — Anesthesia Preprocedure Evaluation (Addendum)
Anesthesia Evaluation  Patient identified by MRN, date of birth, ID band Patient awake    Reviewed: Allergy & Precautions, NPO status , Patient's Chart, lab work & pertinent test results  Airway Mallampati: III  TM Distance: >3 FB Neck ROM: Full    Dental  (+) Dental Advisory Given, Edentulous Upper, Edentulous Lower   Pulmonary asthma , Patient abstained from smoking., former smoker,    Pulmonary exam normal        Cardiovascular hypertension, Pt. on medications  Rhythm:Regular Rate:Normal     Neuro/Psych PSYCHIATRIC DISORDERS Anxiety Depression negative neurological ROS     GI/Hepatic negative GI ROS, Neg liver ROS,   Endo/Other  negative endocrine ROS  Renal/GU negative Renal ROS     Musculoskeletal  (+) Arthritis ,   Abdominal (+) + obese,   Peds  Hematology negative hematology ROS (+)   Anesthesia Other Findings   Reproductive/Obstetrics                           Anesthesia Physical Anesthesia Plan  ASA: 2  Anesthesia Plan: Spinal   Post-op Pain Management:  Regional for Post-op pain   Induction: Intravenous  PONV Risk Score and Plan: 3 and Ondansetron, Dexamethasone and Midazolam  Airway Management Planned: Natural Airway and Simple Face Mask  Additional Equipment: None  Intra-op Plan:   Post-operative Plan:   Informed Consent: I have reviewed the patients History and Physical, chart, labs and discussed the procedure including the risks, benefits and alternatives for the proposed anesthesia with the patient or authorized representative who has indicated his/her understanding and acceptance.     Dental advisory given  Plan Discussed with: CRNA  Anesthesia Plan Comments: (Lab Results      Component                Value               Date                      WBC                      9.3                 05/28/2021                HGB                      14.1                 05/28/2021                HCT                      44.5                05/28/2021                MCV                      95.5                05/28/2021                PLT                      370  05/28/2021           )       Anesthesia Quick Evaluation

## 2021-05-30 NOTE — Progress Notes (Signed)
Orthopedic Tech Progress Note Patient Details:  Jasmine Mejia 1966-10-07 [id number]  Patient ID: Jasmine Mejia, female   DOB: 1965/12/24, 55 y.o.   MRN: [id number]  Jasmine Mejia 05/30/2021, 4:16 PM Pt placed in cpm in room @1615 .

## 2021-05-31 DIAGNOSIS — M1711 Unilateral primary osteoarthritis, right knee: Secondary | ICD-10-CM | POA: Diagnosis not present

## 2021-05-31 LAB — CBC
HCT: 36.9 % (ref 36.0–46.0)
Hemoglobin: 11.9 g/dL — ABNORMAL LOW (ref 12.0–15.0)
MCH: 29.9 pg (ref 26.0–34.0)
MCHC: 32.2 g/dL (ref 30.0–36.0)
MCV: 92.7 fL (ref 80.0–100.0)
Platelets: 325 10*3/uL (ref 150–400)
RBC: 3.98 MIL/uL (ref 3.87–5.11)
RDW: 12.3 % (ref 11.5–15.5)
WBC: 11.1 10*3/uL — ABNORMAL HIGH (ref 4.0–10.5)
nRBC: 0 % (ref 0.0–0.2)

## 2021-05-31 NOTE — Progress Notes (Addendum)
PATIENT ID: Jasmine Mejia  MRN: [id number]  DOB/AGE:  53/17/1967 / 53 y.o.  1 Day Post-Op Procedure(s) (LRB): TOTAL KNEE ARTHROPLASTY (Right)    PROGRESS NOTE Subjective: Patient is alert, oriented, no Nausea, no Vomiting, yes passing gas. Taking PO well. Denies SOB, Chest or Calf Pain. Using Incentive Spirometer, PAS in place. Ambulate WBAT, Patient reports pain as 7/10 .    Objective: Vital signs in last 24 hours: Vitals:   05/30/21 1834 05/30/21 2204 05/31/21 0154 05/31/21 0518  BP: 118/67 (!) 152/100 (!) 162/96 (!) 165/91  Pulse: 79 74 91 81  Resp: 18 18 18 16   Temp: 98.6 F (37 C) 98.5 F (36.9 C) 99 F (37.2 C) 98.6 F (37 C)  TempSrc: Oral Oral Oral Oral  SpO2: 93% 90% 97% 96%  Weight:  103.1 kg    Height:  5\' 6"  (1.676 m)        Intake/Output from previous day: I/O last 3 completed shifts: In: 3562.8 [P.O.:1080; I.V.:2182.8; IV Piggyback:300] Out: 4150 [Urine:4150]   Intake/Output this shift: Total I/O In: -  Out: 200 [Urine:200]   LABORATORY DATA: Recent Labs    05/28/21 1330 05/31/21 0341  WBC 9.3 11.1*  HGB 14.1 11.9*  HCT 44.5 36.9  PLT 370 325    Examination: Neurologically intact Neurovascular intact Sensation intact distally Intact pulses distally Dorsiflexion/Plantar flexion intact Incision: dressing C/D/I and no drainage No cellulitis present Compartment soft}  Assessment:   1 Day Post-Op Procedure(s) (LRB): TOTAL KNEE ARTHROPLASTY (Right) ADDITIONAL DIAGNOSIS: Expected Acute Blood Loss Anemia, Hypertension and anxiety, depression  Patient's anticipated LOS is less than 2 midnights, meeting these requirements: - Younger than 71 - Lives within 1 hour of care - Has a competent adult at home to recover with post-op recover - NO history of  - Chronic pain requiring opiods  - Diabetes  - Coronary Artery Disease  - Heart failure  - Heart attack  - Stroke  - DVT/VTE  - Cardiac arrhythmia  - Respiratory Failure/COPD  - Renal  failure  - Anemia  - Advanced Liver disease     Plan: PT/OT WBAT, AROM and PROM  DVT Prophylaxis:  SCDx72hrs, ASA 325 mg BID x 2 weeks DISCHARGE PLAN: Home DISCHARGE NEEDS: HHPT, CPM, Walker, and 3-in-1 comode seat     06/02/21 05/31/2021, 9:31 AM

## 2021-05-31 NOTE — Evaluation (Signed)
Physical Therapy Evaluation Patient Details Name: Jasmine Mejia MRN: [id number] DOB: Jan 17, 1966 Today's Date: 05/31/2021   History of Present Illness  55 yo female s/p R TKR on 6/24. PMH includes pre-DM, HTN, anxiety, depression, HLD, R knee arthroscopy 10/2020.  Clinical Impression   Pt presents with severe R knee pain, difficulty performing mobility tasks, R knee weakness and stiffness post-operatively, antalgic gait, and decreased activity tolerance vs baseline. Pt to benefit from acute PT to address deficits. Pt ambulated short hallway distance with use of RW, overall requiring min-mod assist for mobility tasks at this time. Pt vocalizing in pain throughout session. Pt will need one more session today, and possibly will not be ready for d/c today given pain presentation. PT to progress mobility as tolerated, and will continue to follow acutely.      Follow Up Recommendations Home health PT;Supervision for mobility/OOB    Equipment Recommendations  None recommended by PT    Recommendations for Other Services       Precautions / Restrictions Precautions Precautions: Fall Restrictions Weight Bearing Restrictions: No      Mobility  Bed Mobility Overal bed mobility: Needs Assistance Bed Mobility: Supine to Sit     Supine to sit: HOB elevated;Mod assist     General bed mobility comments: assist for trunk elevation, RLE lifting and translation to EOB. Very increased time and effort, verbal cuing for sequencing task.    Transfers Overall transfer level: Needs assistance Equipment used: Rolling walker (2 wheeled) Transfers: Sit to/from Stand Sit to Stand: Min assist;From elevated surface         General transfer comment: Min assist for power up, rise, steady. Verbal cuing for hand placement when rising/sitting.  Ambulation/Gait Ambulation/Gait assistance: Min assist Gait Distance (Feet): 20 Feet Assistive device: Rolling walker (2 wheeled) Gait Pattern/deviations:  Step-through pattern;Decreased stride length;Trunk flexed;Antalgic;Decreased weight shift to right Gait velocity: decr   General Gait Details: Min assist to steady, verbal cuing for upright posture, sequencing gait, very limited by antalgic gait with pain during walking moving from 8 to 10/10.  Stairs            Wheelchair Mobility    Modified Rankin (Stroke Patients Only)       Balance Overall balance assessment: Needs assistance Sitting-balance support: No upper extremity supported;Feet supported Sitting balance-Leahy Scale: Fair     Standing balance support: Bilateral upper extremity supported;During functional activity Standing balance-Leahy Scale: Poor Standing balance comment: reliant on external support                             Pertinent Vitals/Pain Pain Assessment: 0-10 Pain Score: 10-Worst pain ever Pain Location: R knee, during gait Pain Descriptors / Indicators: Sore;Discomfort;Grimacing Pain Intervention(s): Limited activity within patient's tolerance;Monitored during session;Repositioned;Premedicated before session    Home Living Family/patient expects to be discharged to:: Private residence Living Arrangements: Spouse/significant other;Children Available Help at Discharge: Family;Available 24 hours/day (boyfriend works, but will have boyfriend's kids/grandkids to help her during the day) Type of Home: House Home Access: Stairs to enter Entrance Stairs-Rails: Left;Right;Can reach both Entrance Stairs-Number of Steps: 4-5 Home Layout: One level Home Equipment: Walker - 2 wheels;Bedside commode      Prior Function Level of Independence: Independent         Comments: working as a Theatre manager   Dominant Hand: Right    Extremity/Trunk Assessment   Upper Extremity Assessment Upper Extremity Assessment: Defer  to OT evaluation    Lower Extremity Assessment Lower Extremity Assessment: Overall WFL for tasks assessed;RLE  deficits/detail RLE Deficits / Details: anticipated post-operative weakness; able to perform ankle pumps, limited quad set secondary to pain RLE: Unable to fully assess due to pain    Cervical / Trunk Assessment Cervical / Trunk Assessment: Normal  Communication   Communication: No difficulties  Cognition Arousal/Alertness: Awake/alert Behavior During Therapy: WFL for tasks assessed/performed Overall Cognitive Status: Within Functional Limits for tasks assessed                                        General Comments      Exercises Total Joint Exercises Ankle Circles/Pumps: AROM;Both;10 reps;Seated Quad Sets: AAROM;Right;5 reps;Seated Goniometric ROM: knee aarom ~10-45*, limited by pain and stiffness   Assessment/Plan    PT Assessment Patient needs continued PT services  PT Problem List Decreased strength;Decreased mobility;Decreased safety awareness;Decreased activity tolerance;Decreased balance;Decreased knowledge of use of DME;Pain;Decreased range of motion       PT Treatment Interventions Gait training;Therapeutic exercise;Therapeutic activities;DME instruction;Patient/family education;Balance training;Stair training;Functional mobility training;Neuromuscular re-education    PT Goals (Current goals can be found in the Care Plan section)  Acute Rehab PT Goals Patient Stated Goal: go home PT Goal Formulation: With patient Time For Goal Achievement: 05/31/21 Potential to Achieve Goals: Good    Frequency 7X/week   Barriers to discharge        Co-evaluation               AM-PAC PT "6 Clicks" Mobility  Outcome Measure Help needed turning from your back to your side while in a flat bed without using bedrails?: A Little Help needed moving from lying on your back to sitting on the side of a flat bed without using bedrails?: A Lot Help needed moving to and from a bed to a chair (including a wheelchair)?: A Little Help needed standing up from a chair  using your arms (e.g., wheelchair or bedside chair)?: A Little Help needed to walk in hospital room?: A Little Help needed climbing 3-5 steps with a railing? : A Lot 6 Click Score: 16    End of Session   Activity Tolerance: Patient limited by pain;Patient limited by fatigue Patient left: in chair;with call bell/phone within reach;with chair alarm set Nurse Communication: Mobility status PT Visit Diagnosis: Other abnormalities of gait and mobility (R26.89);Difficulty in walking, not elsewhere classified (R26.2)    Time: 3846-6599 PT Time Calculation (min) (ACUTE ONLY): 16 min   Charges:   PT Evaluation $PT Eval Low Complexity: 1 Low         Kaelynne Christley S, PT DPT Acute Rehabilitation Services Pager [phone]  Office [phone]   Tanishia Lemaster E Christain [place] 05/31/2021, 10:01 AM

## 2021-05-31 NOTE — Progress Notes (Signed)
10 mg of methadone wasted in stericycle with Orlinda Blalock. This medication was unable to be wasted in the Pyxis.

## 2021-05-31 NOTE — Progress Notes (Cosign Needed)
Patient received 90mg  methodone; 10mg  or 1 cc wasted in med room in steri cycle with RN;

## 2021-05-31 NOTE — Discharge Summary (Addendum)
Patient ID: Jasmine Mejia MRN: [id number] DOB/AGE: 1966-04-18 55 y.o.  Admit date: 05/30/2021 Discharge date: 05/31/2021  Admission Diagnoses:  Principal Problem:   Primary osteoarthritis of right knee   Discharge Diagnoses:  Same  Past Medical History:  Diagnosis Date   Anxiety    Arthritis    Asthma    Depression    Hypertension    Pneumonia 2016   Pre-diabetes     Surgeries: Procedure(s): TOTAL KNEE ARTHROPLASTY on 05/30/2021   Consultants:   Discharged Condition: Improved  Hospital Course: Jasmine Mejia is an 55 y.o. female who was admitted 05/30/2021 for operative treatment ofPrimary osteoarthritis of right knee. Patient has severe unremitting pain that affects sleep, daily activities, and work/hobbies. After pre-op clearance the patient was taken to the operating room on 05/30/2021 and underwent  Procedure(s): TOTAL KNEE ARTHROPLASTY.    Patient was given perioperative antibiotics:  Anti-infectives (From admission, onward)    Start     Dose/Rate Route Frequency Ordered Stop   05/30/21 1600  ceFAZolin (ANCEF) IVPB 2g/100 mL premix        2 g 200 mL/hr over 30 Minutes Intravenous Every 6 hours 05/30/21 1529 05/30/21 2146   05/30/21 0830  ceFAZolin (ANCEF) IVPB 2g/100 mL premix        2 g 200 mL/hr over 30 Minutes Intravenous On call to O.R. 05/30/21 0820 05/30/21 1103        Patient was given sequential compression devices, early ambulation, and chemoprophylaxis to prevent DVT.  Patient benefited maximally from hospital stay and there were no complications.    Recent vital signs: Patient Vitals for the past 24 hrs:  BP Temp Temp src Pulse Resp SpO2 Height Weight  05/31/21 0518 (!) 165/91 98.6 F (37 C) Oral 81 16 96 % -- --  05/31/21 0154 (!) 162/96 99 F (37.2 C) Oral 91 18 97 % -- --  05/30/21 2204 (!) 152/100 98.5 F (36.9 C) Oral 74 18 90 % 5\' 6"  (1.676 m) 103.1 kg  05/30/21 1834 118/67 98.6 F (37 C) Oral 79 18 93 % -- --  05/30/21 1530 130/86  99 F (37.2 C) Oral 88 16 96 % -- --  05/30/21 1520 -- -- -- 67 14 98 % -- --  05/30/21 1510 -- -- -- 69 12 100 % -- --  05/30/21 1500 (!) 121/95 -- -- 67 12 100 % -- --  05/30/21 1457 127/85 97.9 F (36.6 C) -- 79 11 100 % -- --  05/30/21 1455 (!) 147/100 -- -- 87 12 100 % -- --  05/30/21 1445 (!) 154/97 97.9 F (36.6 C) -- 85 11 100 % -- --  05/30/21 1430 (!) 147/106 -- -- 84 15 99 % -- --  05/30/21 1415 (!) 144/90 -- -- 83 13 100 % -- --  05/30/21 1400 (!) 145/106 -- -- 81 14 100 % -- --  05/30/21 1345 (!) 175/110 -- -- 89 17 99 % -- --  05/30/21 1330 (!) 158/112 97.9 F (36.6 C) -- 87 18 98 % -- --  05/30/21 1315 123/77 -- -- 81 (!) 9 100 % -- --  05/30/21 1300 122/82 -- -- 82 (!) 8 100 % -- --  05/30/21 1245 (!) 120/92 -- -- 82 (!) 9 99 % -- --     Recent laboratory studies:  Recent Labs    05/28/21 1330 05/31/21 0341  WBC 9.3 11.1*  HGB 14.1 11.9*  HCT 44.5 36.9  PLT 370 325  Discharge Medications:   Allergies as of 05/31/2021   No Known Allergies      Medication List     STOP taking these medications    BC Fast Pain Relief Arthritis 1000-65 MG Pack Generic drug: Aspirin-Caffeine   HYDROcodone-acetaminophen 10-325 MG tablet Commonly known as: NORCO   HYDROcodone-acetaminophen 5-325 MG tablet Commonly known as: NORCO/VICODIN       TAKE these medications    albuterol 108 (90 Base) MCG/ACT inhaler Commonly known as: VENTOLIN HFA Inhale 2 puffs into the lungs every 6 (six) hours as needed for wheezing.   ARIPiprazole 5 MG tablet Commonly known as: ABILIFY Take 5 mg by mouth daily.   aspirin EC 325 MG tablet Take 1 tablet (325 mg total) by mouth 2 (two) times daily after a meal. Take x 1 month post op to decrease risk of blood clots.   atorvastatin 40 MG tablet Commonly known as: LIPITOR Take 1 tablet (40 mg total) by mouth daily. What changed: when to take this   docusate sodium 100 MG capsule Commonly known as: Colace Take 1 capsule  (100 mg total) by mouth 2 (two) times daily.   gabapentin 600 MG tablet Commonly known as: NEURONTIN Take 600 mg by mouth 3 (three) times daily.   lisinopril 20 MG tablet Commonly known as: ZESTRIL TAKE 1 TABLET BY MOUTH EVERY DAY What changed: when to take this   methadone 10 MG/5ML solution Commonly known as: DOLOPHINE Take 40 mLs (80 mg total) by mouth daily. What changed:  how much to take when to take this   mirtazapine 30 MG tablet Commonly known as: REMERON Take 30 mg by mouth at bedtime.   multivitamin with minerals Tabs tablet Take 1 tablet by mouth in the morning.   oxyCODONE-acetaminophen 5-325 MG tablet Commonly known as: PERCOCET/ROXICET Take 1-2 tablets by mouth every 6 (six) hours as needed for severe pain.   tiZANidine 2 MG tablet Commonly known as: ZANAFLEX Take 1 tablet (2 mg total) by mouth every 8 (eight) hours as needed for muscle spasms.   venlafaxine XR 150 MG 24 hr capsule Commonly known as: Effexor XR Take 1 capsule (150 mg total) by mouth daily with breakfast.               Durable Medical Equipment  (From admission, onward)           Start     Ordered   05/30/21 1530  DME Walker rolling  Once       Question:  Patient needs a walker to treat with the following condition  Answer:  Primary osteoarthritis of right knee   05/30/21 1529   05/30/21 1530  DME 3 n 1  Once        05/30/21 1529              Discharge Care Instructions  (From admission, onward)           Start     Ordered   05/31/21 0000  Weight bearing as tolerated        05/31/21 0935            Diagnostic Studies: No results found.  Disposition: Discharge disposition: 01-Home or Self Care       Discharge Instructions     Call MD / Call 911   Complete by: As directed    If you experience chest pain or shortness of breath, CALL 911 and be transported to the hospital emergency room.  If  you develope a fever above 101 F, pus (white drainage)  or increased drainage or redness at the wound, or calf pain, call your surgeon's office.   Constipation Prevention   Complete by: As directed    Drink plenty of fluids.  Prune juice may be helpful.  You may use a stool softener, such as Colace (over the counter) 100 mg twice a day.  Use MiraLax (over the counter) for constipation as needed.   Diet - low sodium heart healthy   Complete by: As directed    Driving restrictions   Complete by: As directed    No driving for 2 weeks   Increase activity slowly as tolerated   Complete by: As directed    Patient may shower   Complete by: As directed    You may shower without a dressing once there is no drainage.  Do not wash over the wound.  If drainage remains, cover wound with plastic wrap and then shower.   Post-operative opioid taper instructions:   Complete by: As directed    POST-OPERATIVE OPIOID TAPER INSTRUCTIONS: It is important to wean off of your opioid medication as soon as possible. If you do not need pain medication after your surgery it is ok to stop day one. Opioids include: Codeine, Hydrocodone(Norco, Vicodin), Oxycodone(Percocet, oxycontin) and hydromorphone amongst others.  Long term and even short term use of opiods can cause: Increased pain response Dependence Constipation Depression Respiratory depression And more.  Withdrawal symptoms can include Flu like symptoms Nausea, vomiting And more Techniques to manage these symptoms Hydrate well Eat regular healthy meals Stay active Use relaxation techniques(deep breathing, meditating, yoga) Do Not substitute Alcohol to help with tapering If you have been on opioids for less than two weeks and do not have pain than it is ok to stop all together.  Plan to wean off of opioids This plan should start within one week post op of your joint replacement. Maintain the same interval or time between taking each dose and first decrease the dose.  Cut the total daily intake of  opioids by one tablet each day Next start to increase the time between doses. The last dose that should be eliminated is the evening dose.      Weight bearing as tolerated   Complete by: As directed         Follow-up Information     Jodi Geralds, MD Follow up in 2 week(s).   Specialty: Orthopedic Surgery Contact information: [address] [place] [address] [phone]                  Signed: Dannielle Burn 05/31/2021, 9:37 AM

## 2021-05-31 NOTE — Progress Notes (Signed)
Physical Therapy Treatment Patient Details Name: Jasmine Mejia MRN: [id number] DOB: July 31, 1966 Today's Date: 05/31/2021    History of Present Illness 55 yo female s/p R TKR on 6/24. PMH includes pre-DM, HTN, anxiety, depression, HLD, R knee arthroscopy 10/2020.    PT Comments    Pt eager to d/c home upon PT arrival. Pt ambulatory for hallway distance with use of RW and close guard for safety. Pt proficiently navigated steps with min cuing, pt's boyfriend present during session and understands how to assist/cue pt for stair navigation at home. PT reviewed, practiced, and administered TKR exercise handout to pt, pt with no further questions. All PT education complete, pt will have boyfriend and family to assist her at d/c, pt appropriate to d/c from acute setting from a PT perspective.    Follow Up Recommendations  Home health PT;Supervision for mobility/OOB     Equipment Recommendations  None recommended by PT    Recommendations for Other Services       Precautions / Restrictions Precautions Precautions: Fall Precaution Comments: reviewed no pillow/bolster under knee when resting Restrictions Weight Bearing Restrictions: No    Mobility  Bed Mobility Overal bed mobility: Needs Assistance Bed Mobility: Supine to Sit     Supine to sit: HOB elevated;Min assist     General bed mobility comments: min assist for trunk elevation and RLE lowering over EOB. Increased time and effort, pt's boyfriend can assist with this at d/c.    Transfers Overall transfer level: Needs assistance Equipment used: Rolling walker (2 wheeled) Transfers: Sit to/from Stand Sit to Stand: Min assist;From elevated surface;Min guard         General transfer comment: min assist for initial rise for power up and steadying, min guard for second stand from recliner  Ambulation/Gait Ambulation/Gait assistance: Min guard;+2 safety/equipment (chair follow) Gait Distance (Feet): 80 Feet Assistive  device: Rolling walker (2 wheeled) Gait Pattern/deviations: Step-through pattern;Decreased stride length;Trunk flexed;Antalgic;Decreased weight shift to right Gait velocity: decr   General Gait Details: min guard for safety, verbal cuing for upright posture, pt utilizing step-over-step gait   Stairs Stairs: Yes Stairs assistance: Min guard Stair Management: Two rails;Step to pattern;Forwards Number of Stairs: 3 General stair comments: min guard for safety, verbal cuing for sequencing (up with the good leg,down with the bad leg leading), hand placement, foot placement on steps   Wheelchair Mobility    Modified Rankin (Stroke Patients Only)       Balance Overall balance assessment: Needs assistance Sitting-balance support: No upper extremity supported;Feet supported Sitting balance-Leahy Scale: Fair     Standing balance support: Bilateral upper extremity supported;During functional activity Standing balance-Leahy Scale: Poor Standing balance comment: reliant on external support                            Cognition Arousal/Alertness: Awake/alert Behavior During Therapy: WFL for tasks assessed/performed Overall Cognitive Status: Within Functional Limits for tasks assessed                                        Exercises Total Joint Exercises Ankle Circles/Pumps: AROM;Both;10 reps;Seated Quad Sets: AAROM;Right;5 reps;Seated Short Arc Quad: AROM;Right;5 reps;Seated Heel Slides: AAROM;Right;5 reps;Seated Hip ABduction/ADduction: AROM;Right;10 reps;Seated Goniometric ROM: knee aarom ~5-45*, limited by pain and stiffness    General Comments        Pertinent Vitals/Pain Pain Assessment: Faces Faces  Pain Scale: Hurts even more Pain Location: R knee, during gait Pain Descriptors / Indicators: Sore;Discomfort;Grimacing Pain Intervention(s): Limited activity within patient's tolerance;Monitored during session;Repositioned    Home Living                       Prior Function            PT Goals (current goals can now be found in the care plan section) Acute Rehab PT Goals Patient Stated Goal: go home PT Goal Formulation: With patient Time For Goal Achievement: 05/31/21 Potential to Achieve Goals: Good Progress towards PT goals: Progressing toward goals    Frequency    7X/week      PT Plan Current plan remains appropriate    Co-evaluation              AM-PAC PT "6 Clicks" Mobility   Outcome Measure  Help needed turning from your back to your side while in a flat bed without using bedrails?: A Little Help needed moving from lying on your back to sitting on the side of a flat bed without using bedrails?: A Little Help needed moving to and from a bed to a chair (including a wheelchair)?: A Little Help needed standing up from a chair using your arms (e.g., wheelchair or bedside chair)?: A Little Help needed to walk in hospital room?: A Little Help needed climbing 3-5 steps with a railing? : A Little 6 Click Score: 18    End of Session   Activity Tolerance: Patient limited by pain;Patient limited by fatigue Patient left: in chair;with call bell/phone within reach;with chair alarm set Nurse Communication: Mobility status PT Visit Diagnosis: Other abnormalities of gait and mobility (R26.89);Difficulty in walking, not elsewhere classified (R26.2)     Time: 2778-2423 PT Time Calculation (min) (ACUTE ONLY): 32 min  Charges:  $Gait Training: 8-22 mins $Therapeutic Exercise: 8-22 mins                     Marye Round, PT DPT Acute Rehabilitation Services Pager [phone]  Office [phone]    Jasmine Mejia 05/31/2021, 2:35 PM

## 2021-05-31 NOTE — TOC Initial Note (Signed)
Transition of Care [facility]) - Initial/Assessment Note    Patient Details  Name: Jasmine Mejia MRN: [id number] Date of Birth: November 08, 1966  Transition of Care [facility]) CM/SW Contact:    Armanda Heritage, RN Phone Number: 05/31/2021, 10:14 AM  Clinical Narrative:   [facility] to provide home health PT.  Patient reports she has rolling walker and 3in1.                 Expected Discharge Plan: Home w Home Health Services Barriers to Discharge: No Barriers Identified   Patient Goals and CMS Choice Patient states their goals for this hospitalization and ongoing recovery are:: to go home with therapy CMS Medicare.gov Compare Post Acute Care list provided to:: Patient Choice offered to / list presented to : Patient  Expected Discharge Plan and Services Expected Discharge Plan: Home w Home Health Services     Post Acute Care Choice: Home Health   Expected Discharge Date: 05/31/21               DME Arranged: N/A           HH Agency: [facility] Date [facility] Agency Contacted: 05/31/21   Representative spoke with at [facility] Agency: pre-arranged in md office  Prior Living Arrangements/Services     Patient language and need for interpreter reviewed:: Yes Do you feel safe going back to the place where you live?: Yes      Need for Family Participation in Patient Care: No (Comment)     Criminal Activity/Legal Involvement Pertinent to Current Situation/Hospitalization: No - Comment as needed  Activities of Daily Living Home Assistive Devices/Equipment: Bedside commode/3-in-1, Walker (specify type), Eyeglasses ADL Screening (condition at time of admission) Patient's cognitive ability adequate to safely complete daily activities?: Yes Is the patient deaf or have difficulty hearing?: No Does the patient have difficulty seeing, even when wearing glasses/contacts?: No Does the patient have difficulty concentrating, remembering, or making decisions?: No Patient able to express need  for assistance with ADLs?: Yes Does the patient have difficulty dressing or bathing?: No Independently performs ADLs?: Yes (appropriate for developmental age) Does the patient have difficulty walking or climbing stairs?: Yes Weakness of Legs: Right Weakness of Arms/Hands: None  Permission Sought/Granted                  Emotional Assessment Appearance:: Appears stated age Attitude/Demeanor/Rapport: Engaged Affect (typically observed): Accepting Orientation: : Oriented to Self, Oriented to Place, Oriented to  Time, Oriented to Situation   Psych Involvement: No (comment)  Admission diagnosis:  Primary osteoarthritis of right knee [M17.11] Patient Active Problem List   Diagnosis Date Noted   Effusion of knee joint right 12/09/2020   Primary osteoarthritis of right knee 08/23/2020   Prediabetes 08/23/2020   Essential hypertension 08/23/2020   Anxiety 08/23/2020   Depression 08/23/2020   Asthma 08/23/2020   Hyperlipidemia, mixed 08/23/2020   Post-op pain 05/26/2012   Ruptured appendicitis 05/18/2012   Yeast infection 05/18/2012   PCP:  Christen Butter, NP Pharmacy:   [facility] [phone] - [place], [place] - [address] [place] [address] Phone: [phone] Fax: [phone]     Social Determinants of Health (SDOH) Interventions    Readmission Risk Interventions No flowsheet data found.

## 2021-06-01 NOTE — Op Note (Signed)
PATIENT ID:      Jasmine Mejia  MRN:     [id number] DOB/AGE:    05/28/1966 / 55 y.o.       OPERATIVE REPORT   DATE OF PROCEDURE:  06/01/2021      PREOPERATIVE DIAGNOSIS:   RIGHT TOTAL KNEE ARTHROPLASTY      Estimated body mass index is 36.69 kg/m as calculated from the following:   Height as of this encounter: 5\' 6"  (1.676 m).   Weight as of this encounter: 103.1 kg.                                                       POSTOPERATIVE DIAGNOSIS:   Same                                                                  PROCEDURE:  Procedure(s): TOTAL KNEE ARTHROPLASTY Using DepuyAttune RP implants #4 Femur, #4Tibia, 7 mm Attune RP bearing, 35 Patella    SURGEON:  ASSISTANT:   bBlair Harvie Junior   (Present and scrubbed throughout the case, critical for assistance with exposure, retraction, instrumentation, and closure.)        ANESTHESIA: spinal, 20cc Exparel, 50cc 0.25% Marcaine EBL: min cc FLUID REPLACEMENT: unk cc crystaloid TOURNIQUET: DRAINS: None TRANEXAMIC ACID: 1gm IV, 2gm topical COMPLICATIONS:  None         INDICATIONS FOR PROCEDURE: The patient has  RIGHT TOTAL KNEE ARTHROPLASTY, mild varus deformities, XR shows bone on bone arthritis, lateral subluxation of tibia. Patient has failed all conservative measures including anti-inflammatory medicines, narcotics, attempts at exercise and weight loss, cortisone injections and viscosupplementation.  Risks and benefits of surgery have been discussed, questions answered.   DESCRIPTION OF PROCEDURE: The patient identified by armband, received  IV antibiotics, in the holding area at [facility]. Patient taken to the operating room, appropriate anesthetic monitors were attached, and spinal anesthesia was  induced. IV Tranexamic acid was given.Tourniquet applied high to the operative thigh. Lateral post and foot positioner applied to the table, the lower extremity was then prepped and draped in usual sterile fashion  from the toes to the tourniquet. Time-out procedure was performed. [facility], was present and scrubbed throughout the case, critical for assistance with, positioning, exposure, retraction, instrumentation, and closure.The skin and subcutaneous tissue along the incision was injected with 20 cc of a mixture of Exparel and Marcaine solution, using a 20-gauge by 1-1/2 inch needle. We began the operation, with the knee flexed 130 degrees, by making the anterior midline incision starting at handbreadth above the patella going over the patella 1 cm medial to and 4 cm distal to the tibial tubercle. Small bleeders in the skin and the subcutaneous tissue identified and cauterized. Transverse retinaculum was incised and reflected medially and a medial parapatellar arthrotomy was accomplished. the patella was everted and theprepatellar fat pad resected. The superficial medial collateral ligament was then elevated from anterior to posterior along the proximal flare of the tibia and anterior half of the menisci resected. The knee was hyperflexed exposing bone on bone arthritis. Peripheral  and notch osteophytes as well as the cruciate ligaments were then resected. We continued to work our way around posteriorly along the proximal tibia, and externally rotated the tibia subluxing it out from underneath the femur. A McHale PCL retractor was placed through the notch and a lateral Hohmann retractor placed, and we then entered the proximal tibia in line with the Depuy starter drill in line with the axis of the tibia followed by an intramedullary guide rod and 0-degree posterior slope cutting guide. The tibial cutting guide, 4 degree posterior sloped, was pinned into place allowing resection of 2 mm of bone medially and 10 mm of bone laterally. Satisfied with the tibial resection, we then entered the distal femur 2 mm anterior to the PCL origin with the intramedullary guide rod and applied the distal femoral cutting guide set at  9 mm, with 5 degrees of valgus. This was pinned along the epicondylar axis. At this point, the distal femoral cut was accomplished without difficulty. We then sized for a #4 femoral component and pinned the guide in 3 degrees of external rotation. The chamfer cutting guide was pinned into place. The anterior, posterior, and chamfer cuts were accomplished without difficulty followed by the Attune RP box cutting guide and the box cut. We also removed posterior osteophytes from the posterior femoral condyles. The posterior capsule was injected with Exparel solution. The knee was brought into full extension. We checked our extension gap and fit a 7 mm bearing. Distracting in extension with a lamina spreader,  bleeders in the posterior capsule, Posterior medial and posterior lateral gutter were cauterized.  The transexamic acid-soaked sponge was then placed in the gap of the knee in extension. The knee was flexed 30. The posterior patella cut was accomplished with the 9.5 mm Attune cutting guide, sized for a 54mm dome, and the fixation pegs drilled.The knee was then once again hyperflexed exposing the proximal tibia. We sized for a # 4 tibial base plate, applied the smokestack and the conical reamer followed by the the Delta fin keel punch. We then hammered into place the Attune RP trial femoral component, drilled the lugs, inserted a  7 mm trial bearing, trial patellar button, and took the knee through range of motion from 0-130 degrees. Medial and lateral ligamentous stability was checked. No thumb pressure was required for patellar Tracking. The tourniquet was @ 50 min. All trial components were removed, mating surfaces irrigated with pulse lavage, and dried with suction and sponges. 10 cc of the Exparel solution was applied to the cancellus bone of the patella distal femur and proximal tibia.  After waiting 30 seconds, the bony surfaces were again, dried with sponges. A double batch of DePuy HV cement was mixed and  applied to all bony metallic mating surfaces except for the posterior condyles of the femur itself. In order, we hammered into place the tibial tray and removed excess cement, the femoral component and removed excess cement. The final Attune RP bearing was inserted, and the knee brought to full extension with compression. The patellar button was clamped into place, and excess cement removed. The knee was held at 30 flexion with compression, while the cement cured. The wound was irrigated out with normal saline solution pulse lavage. The rest of the Exparel was injected into the parapatellar arthrotomy, subcutaneous tissues, and periosteal tissues. The parapatellar arthrotomy was closed with running #1 Vicryl suture. The subcutaneous tissue with 3-0 undyed Vicryl suture, and the skin with running 3-0 SQ vicryl. An Aquacil  and Ace wrap were applied. The patient was taken to recovery room without difficulty.   Harvie Junior 06/01/2021, 6:47 PM

## 2021-06-02 ENCOUNTER — Other Ambulatory Visit: Payer: Self-pay | Admitting: Medical-Surgical

## 2021-06-02 ENCOUNTER — Encounter (HOSPITAL_COMMUNITY): Payer: Self-pay | Admitting: Orthopedic Surgery

## 2021-06-02 MED FILL — Methadone HCl Conc 10 MG/ML: ORAL | Qty: 9 | Status: AC

## 2021-07-01 ENCOUNTER — Other Ambulatory Visit: Payer: Self-pay

## 2021-07-01 ENCOUNTER — Ambulatory Visit (INDEPENDENT_AMBULATORY_CARE_PROVIDER_SITE_OTHER): Payer: 59 | Admitting: Medical-Surgical

## 2021-07-01 ENCOUNTER — Encounter: Payer: Self-pay | Admitting: Medical-Surgical

## 2021-07-01 VITALS — BP 106/75 | HR 87 | Resp 20 | Wt 226.0 lb

## 2021-07-01 DIAGNOSIS — R109 Unspecified abdominal pain: Secondary | ICD-10-CM | POA: Diagnosis not present

## 2021-07-01 DIAGNOSIS — R350 Frequency of micturition: Secondary | ICD-10-CM | POA: Diagnosis not present

## 2021-07-01 DIAGNOSIS — M545 Low back pain, unspecified: Secondary | ICD-10-CM | POA: Diagnosis not present

## 2021-07-01 LAB — POCT URINALYSIS DIP (CLINITEK)
Bilirubin, UA: NEGATIVE
Glucose, UA: NEGATIVE mg/dL
Leukocytes, UA: NEGATIVE
Nitrite, UA: NEGATIVE
POC PROTEIN,UA: NEGATIVE
Spec Grav, UA: >=1.03 — AB (ref 1.010–1.025)
Urobilinogen, UA: 0.2 E.U./dL
pH, UA: 5.5 (ref 5.0–8.0)

## 2021-07-01 MED ORDER — PREDNISONE 50 MG PO TABS: 50.0000 mg | ORAL_TABLET | Freq: Every day | ORAL | 0 refills | Status: DC

## 2021-07-01 MED ORDER — TIZANIDINE HCL 2 MG PO TABS: 2.0000 mg | ORAL_TABLET | Freq: Three times a day (TID) | ORAL | 0 refills | Status: DC | PRN

## 2021-07-01 NOTE — Progress Notes (Signed)
  HPI with pertinent ROS:   CC: Low back pain  HPI: Pleasant 55 year old female presenting today for evaluation of low back pain present for the last several weeks.  She recently had her right knee surgery and is doing very well with that but notes that whenever she gets up and tries to move around the house, she has pain across the low back extending bilaterally and accompanied by what feels to be muscle spasms.  When she sits down and rest, the spasms subside but when she is up in trying to be active, they immediately return.  We started mild several weeks ago but have continued to worsen.  She has noticed a bit of increased frequency with her urination but no other urinary symptoms.  Currently treated with methadone for chronic pain as well as gabapentin.  She has not been taking any regular anti-inflammatories.  Previously prescribed tizanidine but does not have any of this at home.  Denies fever, chills, paresthesias, and lower extremity weakness.  I reviewed the past medical history, family history, social history, surgical history, and allergies today and no changes were needed.  Please see the problem list section below in epic for further details.   Physical exam:   General: Well Developed, well nourished, and in no acute distress.  Neuro: Alert and oriented x3.  HEENT: Normocephalic, atraumatic.  Skin: Warm and dry. Cardiac: Regular rate and rhythm, no murmurs rubs or gallops, no lower extremity edema.  Respiratory: Clear to auscultation bilaterally. Not using accessory muscles, speaking in full sentences.  Impression and Recommendations:    1. Frequent urination 2. Flank pain POCT urinalysis positive for trace ketones, high specific gravity, and trace lysed blood.  Sending for culture. - POCT URINALYSIS DIP (CLINITEK) - Urine Culture  3.  Acute bilateral low back pain without sciatica Start prednisone 50 mg x 5 days.  Sending in tizanidine 2 mg every 8 hours as needed.   Consider adding OTC ibuprofen 800 mg every 8 hours as needed after prednisone burst is completed.  Low back pain Home exercises printed and provided with AVS.  Return if symptoms worsen or fail to improve. ___________________________________________ Thayer Ohm, DNP, APRN, FNP-BC Primary Care and Sports Medicine [facility] [place]

## 2021-07-02 LAB — URINE CULTURE
MICRO NUMBER:: 12166115
Result:: NO GROWTH
SPECIMEN QUALITY:: ADEQUATE

## 2021-07-10 ENCOUNTER — Encounter: Payer: Self-pay | Admitting: Medical-Surgical

## 2021-07-10 NOTE — Telephone Encounter (Signed)
Forwarding to PCP.

## 2021-07-11 ENCOUNTER — Ambulatory Visit: Payer: 59 | Admitting: Medical-Surgical

## 2021-07-11 DIAGNOSIS — M545 Low back pain, unspecified: Secondary | ICD-10-CM

## 2021-07-11 DIAGNOSIS — R109 Unspecified abdominal pain: Secondary | ICD-10-CM

## 2021-07-14 ENCOUNTER — Ambulatory Visit (INDEPENDENT_AMBULATORY_CARE_PROVIDER_SITE_OTHER): Payer: 59 | Admitting: Medical-Surgical

## 2021-07-14 ENCOUNTER — Telehealth: Payer: Self-pay | Admitting: General Practice

## 2021-07-14 DIAGNOSIS — Z5329 Procedure and treatment not carried out because of patient's decision for other reasons: Secondary | ICD-10-CM

## 2021-07-14 NOTE — Progress Notes (Signed)
   Complete physical exam  Patient: Jasmine Mejia   DOB: 09/26/1997   55 y.o. Female  MRN: [id number]  Subjective:    No chief complaint on file.   Jasmine Mejia is a 55 y.o. female who presents today for a complete physical exam. She reports consuming a {diet types:17450} diet. {types:19826} She generally feels {DESC; WELL/FAIRLY WELL/POORLY:18703}. She reports sleeping {DESC; WELL/FAIRLY WELL/POORLY:18703}. She {does/does not:200015} have additional problems to discuss today.    Most recent fall risk assessment:    06/03/2022   10:42 AM  Fall Risk   Falls in the past year? 0  Number falls in past yr: 0  Injury with Fall? 0  Risk for fall due to : No Fall Risks  Follow up Falls evaluation completed     Most recent depression screenings:    06/03/2022   10:42 AM 04/24/2021   10:46 AM  PHQ 2/9 Scores  PHQ - 2 Score 0 0  PHQ- 9 Score 5     {VISON DENTAL STD PSA (Optional):27386}  {History (Optional):23778}  Patient Care Team: Jaeleigh Monaco, NP as PCP - General (Nurse Practitioner)   Outpatient Medications Prior to Visit  Medication Sig   fluticasone (FLONASE) 50 MCG/ACT nasal spray Place 2 sprays into both nostrils in the morning and at bedtime. After 7 days, reduce to once daily.   norgestimate-ethinyl estradiol (SPRINTEC 28) 0.25-35 MG-MCG tablet Take 1 tablet by mouth daily.   Nystatin POWD Apply liberally to affected area 2 times per day   spironolactone (ALDACTONE) 100 MG tablet Take 1 tablet (100 mg total) by mouth daily.   No facility-administered medications prior to visit.    ROS        Objective:     There were no vitals taken for this visit. {Vitals History (Optional):23777}  Physical Exam   No results found for any visits on 07/09/22. {Show previous labs (optional):23779}    Assessment & Plan:    Routine Health Maintenance and Physical Exam  Immunization History  Administered Date(s) Administered   DTaP 12/10/1999, 02/05/2000,  04/15/2000, 12/30/2000, 07/15/2004   Hepatitis A 05/11/2008, 05/17/2009   Hepatitis B 09/27/1999, 11/04/1999, 04/15/2000   HiB (PRP-OMP) 12/10/1999, 02/05/2000, 04/15/2000, 12/30/2000   IPV 12/10/1999, 02/05/2000, 10/04/2000, 07/15/2004   Influenza,inj,Quad PF,6+ Mos 08/17/2014   Influenza-Unspecified 11/16/2012   MMR 10/04/2001, 07/15/2004   Meningococcal Polysaccharide 05/16/2012   Pneumococcal Conjugate-13 12/30/2000   Pneumococcal-Unspecified 04/15/2000, 06/29/2000   Tdap 05/16/2012   Varicella 10/04/2000, 05/11/2008    Health Maintenance  Topic Date Due   HIV Screening  Never done   Hepatitis C Screening  Never done   INFLUENZA VACCINE  07/07/2022   PAP-Cervical Cytology Screening  07/09/2022 (Originally 09/25/2020)   PAP SMEAR-Modifier  07/09/2022 (Originally 09/25/2020)   TETANUS/TDAP  07/09/2022 (Originally 05/16/2022)   HPV VACCINES  Discontinued   COVID-19 Vaccine  Discontinued    Discussed health benefits of physical activity, and encouraged her to engage in regular exercise appropriate for her age and condition.  Problem List Items Addressed This Visit   None Visit Diagnoses     Annual physical exam    -  Primary   Cervical cancer screening       Need for Tdap vaccination          No follow-ups on file.     Nikaya Nasby, NP   

## 2021-07-14 NOTE — Telephone Encounter (Signed)
Transition Care Management Follow-up Telephone Call Date of discharge and from where: 07/13/21 from [facility] How have you been since you were released from the hospital? Patient had OV with Christen Butter, NP today. Any questions or concerns? No

## 2021-08-06 ENCOUNTER — Encounter: Payer: Self-pay | Admitting: Family Medicine

## 2021-08-20 ENCOUNTER — Other Ambulatory Visit: Payer: Self-pay | Admitting: Medical-Surgical

## 2021-09-10 ENCOUNTER — Other Ambulatory Visit: Payer: Self-pay | Admitting: Medical-Surgical

## 2021-09-22 ENCOUNTER — Telehealth: Payer: Self-pay

## 2021-09-22 ENCOUNTER — Telehealth: Payer: Self-pay | Admitting: General Practice

## 2021-09-22 NOTE — Telephone Encounter (Signed)
Jasmine Mejia called and left a message stating she went to the ED for shortness of breath. She states she left the ED due to the long wait. I looked at her labs and she had an elevated D-Dimer. I advised her to go back to the ED for a CT urgently.

## 2021-09-22 NOTE — Telephone Encounter (Signed)
Transition Care Management Follow-up Telephone Call Date of discharge and from where: 09/21/21 from [facility] How have you been since you were released from the hospital? Patient is in the ED; just had her CT scan and is waiting for results. Any questions or concerns? No  Items Reviewed: Did the pt receive and understand the discharge instructions provided?  Patient left AMA. Medications obtained and verified?  Patient left AMA. Other? No  Any new allergies since your discharge? No  Dietary orders reviewed? No Do you have support at home? Yes

## 2021-09-24 ENCOUNTER — Telehealth: Payer: Self-pay | Admitting: General Practice

## 2021-09-24 NOTE — Telephone Encounter (Signed)
Transition Care Management Follow-up Telephone Call Date of discharge and from where: 09/22/21 from [facility] How have you been since you were released from the hospital? Doing better.  Any questions or concerns? No  Items Reviewed: Did the pt receive and understand the discharge instructions provided? Yes  Medications obtained and verified? Yes  Other? No  Any new allergies since your discharge? No  Dietary orders reviewed? Yes Do you have support at home? Yes   Home Care and Equipment/Supplies: Were home health services ordered? no  Functional Questionnaire: (I = Independent and D = Dependent) ADLs: I  Bathing/Dressing- I  Meal Prep- I  Eating- I  Maintaining continence- I  Transferring/Ambulation- I  Managing Meds- I  Follow up appointments reviewed:  PCP Hospital f/u appt confirmed? Yes  Scheduled to see Christen Butter, NP on 10/09/21 @ 950. Specialist Hospital f/u appt confirmed? No   Are transportation arrangements needed? No  If their condition worsens, is the pt aware to call PCP or go to the Emergency Dept.? Yes Was the patient provided with contact information for the PCP's office or ED? Yes Was to pt encouraged to call back with questions or concerns? Yes

## 2021-09-26 ENCOUNTER — Inpatient Hospital Stay: Admission: RE | Admit: 2021-09-26 | Payer: 59 | Source: Ambulatory Visit

## 2021-09-26 ENCOUNTER — Other Ambulatory Visit: Payer: 59

## 2021-10-09 ENCOUNTER — Ambulatory Visit (INDEPENDENT_AMBULATORY_CARE_PROVIDER_SITE_OTHER): Payer: Self-pay | Admitting: Medical-Surgical

## 2021-10-09 DIAGNOSIS — Z91199 Patient's noncompliance with other medical treatment and regimen due to unspecified reason: Secondary | ICD-10-CM

## 2021-10-09 NOTE — Progress Notes (Signed)
   Complete physical exam  Patient: Jasmine Mejia   DOB: 09/26/1997   55 y.o. Female  MRN: [id number]  Subjective:    No chief complaint on file.   Jasmine Mejia is a 55 y.o. female who presents today for a complete physical exam. She reports consuming a {diet types:17450} diet. {types:19826} She generally feels {DESC; WELL/FAIRLY WELL/POORLY:18703}. She reports sleeping {DESC; WELL/FAIRLY WELL/POORLY:18703}. She {does/does not:200015} have additional problems to discuss today.    Most recent fall risk assessment:    06/03/2022   10:42 AM  Fall Risk   Falls in the past year? 0  Number falls in past yr: 0  Injury with Fall? 0  Risk for fall due to : No Fall Risks  Follow up Falls evaluation completed     Most recent depression screenings:    06/03/2022   10:42 AM 04/24/2021   10:46 AM  PHQ 2/9 Scores  PHQ - 2 Score 0 0  PHQ- 9 Score 5     {VISON DENTAL STD PSA (Optional):27386}  {History (Optional):23778}  Patient Care Team: Betha Shadix, NP as PCP - General (Nurse Practitioner)   Outpatient Medications Prior to Visit  Medication Sig   fluticasone (FLONASE) 50 MCG/ACT nasal spray Place 2 sprays into both nostrils in the morning and at bedtime. After 7 days, reduce to once daily.   norgestimate-ethinyl estradiol (SPRINTEC 28) 0.25-35 MG-MCG tablet Take 1 tablet by mouth daily.   Nystatin POWD Apply liberally to affected area 2 times per day   spironolactone (ALDACTONE) 100 MG tablet Take 1 tablet (100 mg total) by mouth daily.   No facility-administered medications prior to visit.    ROS        Objective:     There were no vitals taken for this visit. {Vitals History (Optional):23777}  Physical Exam   No results found for any visits on 07/09/22. {Show previous labs (optional):23779}    Assessment & Plan:    Routine Health Maintenance and Physical Exam  Immunization History  Administered Date(s) Administered   DTaP 12/10/1999, 02/05/2000,  04/15/2000, 12/30/2000, 07/15/2004   Hepatitis A 05/11/2008, 05/17/2009   Hepatitis B 09/27/1999, 11/04/1999, 04/15/2000   HiB (PRP-OMP) 12/10/1999, 02/05/2000, 04/15/2000, 12/30/2000   IPV 12/10/1999, 02/05/2000, 10/04/2000, 07/15/2004   Influenza,inj,Quad PF,6+ Mos 08/17/2014   Influenza-Unspecified 11/16/2012   MMR 10/04/2001, 07/15/2004   Meningococcal Polysaccharide 05/16/2012   Pneumococcal Conjugate-13 12/30/2000   Pneumococcal-Unspecified 04/15/2000, 06/29/2000   Tdap 05/16/2012   Varicella 10/04/2000, 05/11/2008    Health Maintenance  Topic Date Due   HIV Screening  Never done   Hepatitis C Screening  Never done   INFLUENZA VACCINE  07/07/2022   PAP-Cervical Cytology Screening  07/09/2022 (Originally 09/25/2020)   PAP SMEAR-Modifier  07/09/2022 (Originally 09/25/2020)   TETANUS/TDAP  07/09/2022 (Originally 05/16/2022)   HPV VACCINES  Discontinued   COVID-19 Vaccine  Discontinued    Discussed health benefits of physical activity, and encouraged her to engage in regular exercise appropriate for her age and condition.  Problem List Items Addressed This Visit   None Visit Diagnoses     Annual physical exam    -  Primary   Cervical cancer screening       Need for Tdap vaccination          No follow-ups on file.     Weslynn Ke, NP   

## 2021-10-10 ENCOUNTER — Ambulatory Visit (INDEPENDENT_AMBULATORY_CARE_PROVIDER_SITE_OTHER): Payer: 59 | Admitting: Family Medicine

## 2021-10-10 ENCOUNTER — Encounter: Payer: Self-pay | Admitting: Family Medicine

## 2021-10-10 VITALS — BP 162/103 | HR 91 | Temp 98.3°F | Wt 227.0 lb

## 2021-10-10 DIAGNOSIS — R0683 Snoring: Secondary | ICD-10-CM | POA: Diagnosis not present

## 2021-10-10 DIAGNOSIS — G4719 Other hypersomnia: Secondary | ICD-10-CM | POA: Diagnosis not present

## 2021-10-10 DIAGNOSIS — R0602 Shortness of breath: Secondary | ICD-10-CM | POA: Diagnosis not present

## 2021-10-10 DIAGNOSIS — R030 Elevated blood-pressure reading, without diagnosis of hypertension: Secondary | ICD-10-CM

## 2021-10-10 DIAGNOSIS — F17210 Nicotine dependence, cigarettes, uncomplicated: Secondary | ICD-10-CM

## 2021-10-10 NOTE — Patient Instructions (Addendum)
For your shortness of breath: -I would like you to get spirometry testing in the next few weeks to see if you have COPD so we can treat this appropriately. -Glad you are feeling better! -Call the smoking hotline for free samples to try to quit smoking  For possible sleep apnea: -Ordering a sleep study - someone will be calling you to schedule

## 2021-10-10 NOTE — Progress Notes (Signed)
Established Patient Office Visit  Subjective:  Patient ID: Jasmine Mejia, female    DOB: [birth year]  Age: 55 y.o. MRN: [id number]  CC:  Chief Complaint  Patient presents with   Follow-up    HPI Jasmine Mejia presents for hospital follow-up  09/21/21 ED: Cough/shortness of breath - D-dimer 2.32; flu negative; RSV negative; COVID negative; CXR with mild increased opacities in bilateral bases; left AMA before physician evaluation 09/22/21 ED: returned to ED for CT d/t elevated D-dimer (reports R knee surgery 3 months ago and current RLE edema); CT = no PE, diffuse groundglass hazy attentuation bilaterally, bilateral pulmonary nodules up to 4 mm (recommend 12 month repeat CT if high risk). Diagnosed with acute complicated bronchitis and started on prednisone burst and 10 days of doxycycline.   10/10/2021 Hospital Follow-up: Patient reports she is feeling significantly better since completing her steroids and antibiotics.  Reports she still has some mild, occasional cough, shortness of breath, congestion, but it is significantly improved.  Feels like she is almost back to baseline.  She reports that she is a smoker: Approximately 1 to 2 packs a day for the past 40 years.  States that she was told she has "beginning COPD" a few years ago but she does not recall ever doing spirometry testing.  Reports that for the past month she has had to use her albuterol inhaler slightly more frequently, about 3 times per week.  Husband is accompanying her and wanted to make note that she will often wake up during the night gasping, snoring occasionally, excessive daytime sleepiness, restless sleeper.  Patient states the symptoms have been going on for at least the past year.  She feels extremely tired during the day because she does not sleep soundly at night.  States there have been a few instances where she is falling asleep sitting up, in conversation with someone although these are rare.   She denies  any chest pain, dizziness/lightheadedness, calf pain, calf swelling or erythema, bloody/purulent sputum, fever, rash, dyspnea at rest.    Past Medical History:  Diagnosis Date   Anxiety    Arthritis    Asthma    Depression    Hypertension    Pneumonia 2016   Pre-diabetes     Past Surgical History:  Procedure Laterality Date   APPENDECTOMY     CHOLECYSTECTOMY     CHONDROPLASTY Right 10/09/2020   Procedure: CHONDROPLASTY;  Surgeon: Hiram Gash, MD;  Location: [place];  Service: Orthopedics;  Laterality: Right;   KNEE ARTHROSCOPY WITH LATERAL MENISECTOMY Right 10/09/2020   Procedure: KNEE ARTHROSCOPY WITH LATERAL MENISECTOMY;  Surgeon: Hiram Gash, MD;  Location: [place];  Service: Orthopedics;  Laterality: Right;   KNEE ARTHROSCOPY WITH MEDIAL MENISECTOMY Right 10/09/2020   Procedure: KNEE ARTHROSCOPY WITH MEDIAL MENISECTOMY;  Surgeon: Hiram Gash, MD;  Location: [place];  Service: Orthopedics;  Laterality: Right;   KNEE ARTHROSCOPY WITH SUBCHONDROPLASTY Right 10/09/2020   Procedure: KNEE ARTHROSCOPY WITH SUBCHONDROPLASTY;  Surgeon: Hiram Gash, MD;  Location: [place];  Service: Orthopedics;  Laterality: Right;   TOTAL KNEE ARTHROPLASTY Right 05/30/2021   Procedure: TOTAL KNEE ARTHROPLASTY;  Surgeon: Dorna Leitz, MD;  Location: [facility];  Service: Orthopedics;  Laterality: Right;   TUBAL LIGATION      Family History  Problem Relation Age of Onset   Hypertension Mother    Heart attack Mother    Diabetes Mother  Hypertension Sister    Heart attack Sister    Diabetes Sister    Hypertension Brother    Heart attack Brother    Diabetes Brother    Colitis Neg Hx    Esophageal cancer Neg Hx    Stomach cancer Neg Hx    Rectal cancer Neg Hx     Social History   Socioeconomic History   Marital status: Significant Other    Spouse name: Not on file   Number of children: Not on file   Years of  education: Not on file   Highest education level: Not on file  Occupational History   Not on file  Tobacco Use   Smoking status: Former    Packs/day: 1.00    Years: 37.00    Pack years: 37.00    Types: Cigarettes    Quit date: 05/21/2021    Years since quitting: 0.3   Smokeless tobacco: Never  Vaping Use   Vaping Use: Some days  Substance and Sexual Activity   Alcohol use: Not Currently   Drug use: Never   Sexual activity: Yes    Partners: Male    Birth control/protection: Post-menopausal  Other Topics Concern   Not on file  Social History Narrative   Not on file   Social Determinants of Health   Financial Resource Strain: Not on file  Food Insecurity: Not on file  Transportation Needs: Not on file  Physical Activity: Not on file  Stress: Not on file  Social Connections: Not on file  Intimate Partner Violence: Not on file    Outpatient Medications Prior to Visit  Medication Sig Dispense Refill   albuterol (VENTOLIN HFA) 108 (90 Base) MCG/ACT inhaler Inhale 2 puffs into the lungs every 6 (six) hours as needed for wheezing. 2 each 11   ARIPiprazole (ABILIFY) 5 MG tablet Take 5 mg by mouth daily.     aspirin EC 325 MG tablet Take 1 tablet (325 mg total) by mouth 2 (two) times daily after a meal. Take x 1 month post op to decrease risk of blood clots. 60 tablet 0   atorvastatin (LIPITOR) 40 MG tablet Take 1 tablet (40 mg total) by mouth daily. 90 tablet 3   docusate sodium (COLACE) 100 MG capsule Take 1 capsule (100 mg total) by mouth 2 (two) times daily. 30 capsule 0   gabapentin (NEURONTIN) 600 MG tablet Take 600 mg by mouth 3 (three) times daily.     lisinopril (ZESTRIL) 20 MG tablet Take 1 tablet (20 mg total) by mouth daily. NEEDS LABS FOR FURTHER REFILLS 30 tablet 0   methadone (DOLOPHINE) 10 MG/5ML solution Take 40 mLs (80 mg total) by mouth daily.     mirtazapine (REMERON) 30 MG tablet Take 30 mg by mouth at bedtime.     Multiple Vitamin (MULTIVITAMIN WITH MINERALS)  TABS tablet Take 1 tablet by mouth in the morning.     predniSONE (DELTASONE) 50 MG tablet Take 1 tablet (50 mg total) by mouth daily. 5 tablet 0   tiZANidine (ZANAFLEX) 2 MG tablet Take 1 tablet (2 mg total) by mouth every 8 (eight) hours as needed for muscle spasms. 40 tablet 0   venlafaxine XR (EFFEXOR-XR) 150 MG 24 hr capsule TAKE 1 CAPSULE BY MOUTH DAILY WITH BREAKFAST. 30 capsule 1   No facility-administered medications prior to visit.    No Known Allergies  ROS Review of Systems All review of systems negative except what is listed in the HPI  Objective:    Physical Exam Vitals reviewed.  Constitutional:      Appearance: Normal appearance.  HENT:     Head: Normocephalic and atraumatic.  Cardiovascular:     Rate and Rhythm: Normal rate and regular rhythm.  Pulmonary:     Effort: Pulmonary effort is normal. No respiratory distress.     Breath sounds: No wheezing, rhonchi or rales.  Musculoskeletal:     Right lower leg: No edema.     Left lower leg: No edema.     Comments: Negative Homan's sign bilaterally, no erythema; R knee with compression wrap in place for mild edema per patient  Skin:    General: Skin is warm and dry.     Findings: No bruising, erythema or lesion.  Neurological:     Mental Status: She is alert and oriented to person, place, and time.  Psychiatric:        Mood and Affect: Mood normal.        Behavior: Behavior normal.        Thought Content: Thought content normal.        Judgment: Judgment normal.    BP (!) 162/103 (BP Location: Left Arm, Patient Position: Sitting, Cuff Size: Normal)   Pulse 91   Temp 98.3 F (36.8 C) (Oral)   Wt 227 lb (103 kg)   LMP 04/06/2020   SpO2 96%   BMI 36.64 kg/m  Wt Readings from Last 3 Encounters:  10/10/21 227 lb (103 kg)  07/01/21 226 lb (102.5 kg)  05/28/21 227 lb (103 kg)     Health Maintenance Due  Topic Date Due   COLONOSCOPY (Pts 45-12yrs Insurance coverage will need to be confirmed)  Never  done    There are no preventive care reminders to display for this patient.  Lab Results  Component Value Date   TSH 2.42 08/21/2020   Lab Results  Component Value Date   WBC 11.1 (H) 05/31/2021   HGB 11.9 (L) 05/31/2021   HCT 36.9 05/31/2021   MCV 92.7 05/31/2021   PLT 325 05/31/2021   Lab Results  Component Value Date   NA 145 05/09/2021   K 4.0 05/09/2021   CO2 31 05/09/2021   GLUCOSE 82 05/09/2021   BUN 10 05/09/2021   CREATININE 0.79 05/09/2021   BILITOT 0.3 05/09/2021   AST 17 05/09/2021   ALT 16 05/09/2021   PROT 7.3 05/09/2021   CALCIUM 9.6 05/09/2021   Lab Results  Component Value Date   CHOL 186 10/01/2020   Lab Results  Component Value Date   HDL 48 (L) 10/01/2020   Lab Results  Component Value Date   LDLCALC 108 (H) 10/01/2020   Lab Results  Component Value Date   TRIG 183 (H) 10/01/2020   Lab Results  Component Value Date   CHOLHDL 3.9 10/01/2020   Lab Results  Component Value Date   HGBA1C 6.4 (H) 05/28/2021      Assessment & Plan:   1. Excessive daytime sleepiness 2. Snoring Concern for sleep apnea. Will order split night sleep study and follow-up with patient afterwards for results.   - Split night study   3. Continuous dependence on cigarette smoking 4. Shortness of breath Reports she is feeling significantly better since completing steroids and antibiotics. Almost back to baseline per patient. No alarm findings on exam today.  Recommend spirometry with PCP follow-up within the next 3 weeks or so. Reasonable likelihood for COPD Questionable area on CT scan during ED visit,  given her smoking history, would recommend repeat in 1 year. She plans to call the smoking cessation hotline for free samples and will let us know if she needs any further assistance   5. Elevated blood pressure reading Patient states she has not yet taken her BP meds today and refuses recheck. Reports she gets much better readings home. Educated on  significance of compliance. We will recheck this at upcoming nurse/PCP visit.   Patient aware of signs/symptoms requiring further/urgent evaluation.    Follow-up: Return in about 3 weeks (around 10/31/2021) for nurse visit for spirometry, then provider visit to discuss.  Please contact office for sooner follow-up if symptoms do not improve or worsen. Seek emergency care if symptoms become severe.   Terrilyn Saver, NP

## 2021-10-13 ENCOUNTER — Other Ambulatory Visit: Payer: Self-pay

## 2021-10-14 ENCOUNTER — Other Ambulatory Visit: Payer: Self-pay | Admitting: Medical-Surgical

## 2021-10-20 ENCOUNTER — Other Ambulatory Visit: Payer: Self-pay | Admitting: Medical-Surgical

## 2021-11-03 ENCOUNTER — Ambulatory Visit (HOSPITAL_BASED_OUTPATIENT_CLINIC_OR_DEPARTMENT_OTHER)
Admission: RE | Admit: 2021-11-03 | Discharge: 2021-11-03 | Disposition: A | Discharge status: Home / Self Care | Payer: 59 | Source: Ambulatory Visit | Attending: Family Medicine | Admitting: Family Medicine

## 2021-11-03 ENCOUNTER — Encounter (HOSPITAL_BASED_OUTPATIENT_CLINIC_OR_DEPARTMENT_OTHER): Payer: Self-pay

## 2021-11-03 ENCOUNTER — Ambulatory Visit (INDEPENDENT_AMBULATORY_CARE_PROVIDER_SITE_OTHER): Payer: 59 | Admitting: Family Medicine

## 2021-11-03 ENCOUNTER — Other Ambulatory Visit: Payer: Self-pay

## 2021-11-03 ENCOUNTER — Ambulatory Visit (INDEPENDENT_AMBULATORY_CARE_PROVIDER_SITE_OTHER): Payer: 59

## 2021-11-03 VITALS — BP 136/83 | HR 87 | Temp 97.9°F | Ht 66.0 in | Wt 234.0 lb

## 2021-11-03 DIAGNOSIS — F17210 Nicotine dependence, cigarettes, uncomplicated: Secondary | ICD-10-CM

## 2021-11-03 DIAGNOSIS — R0602 Shortness of breath: Secondary | ICD-10-CM

## 2021-11-03 IMAGING — DX DG CHEST 2V
2 series · 2 of 2 positions shown · non-contrast
Comparison: None

CLINICAL DATA: Shortness of breath, question restrictive lung
disease by spirometry, smoker, shortness of breath for a few months

EXAM:
CHEST - 2 VIEW

[chest pa]
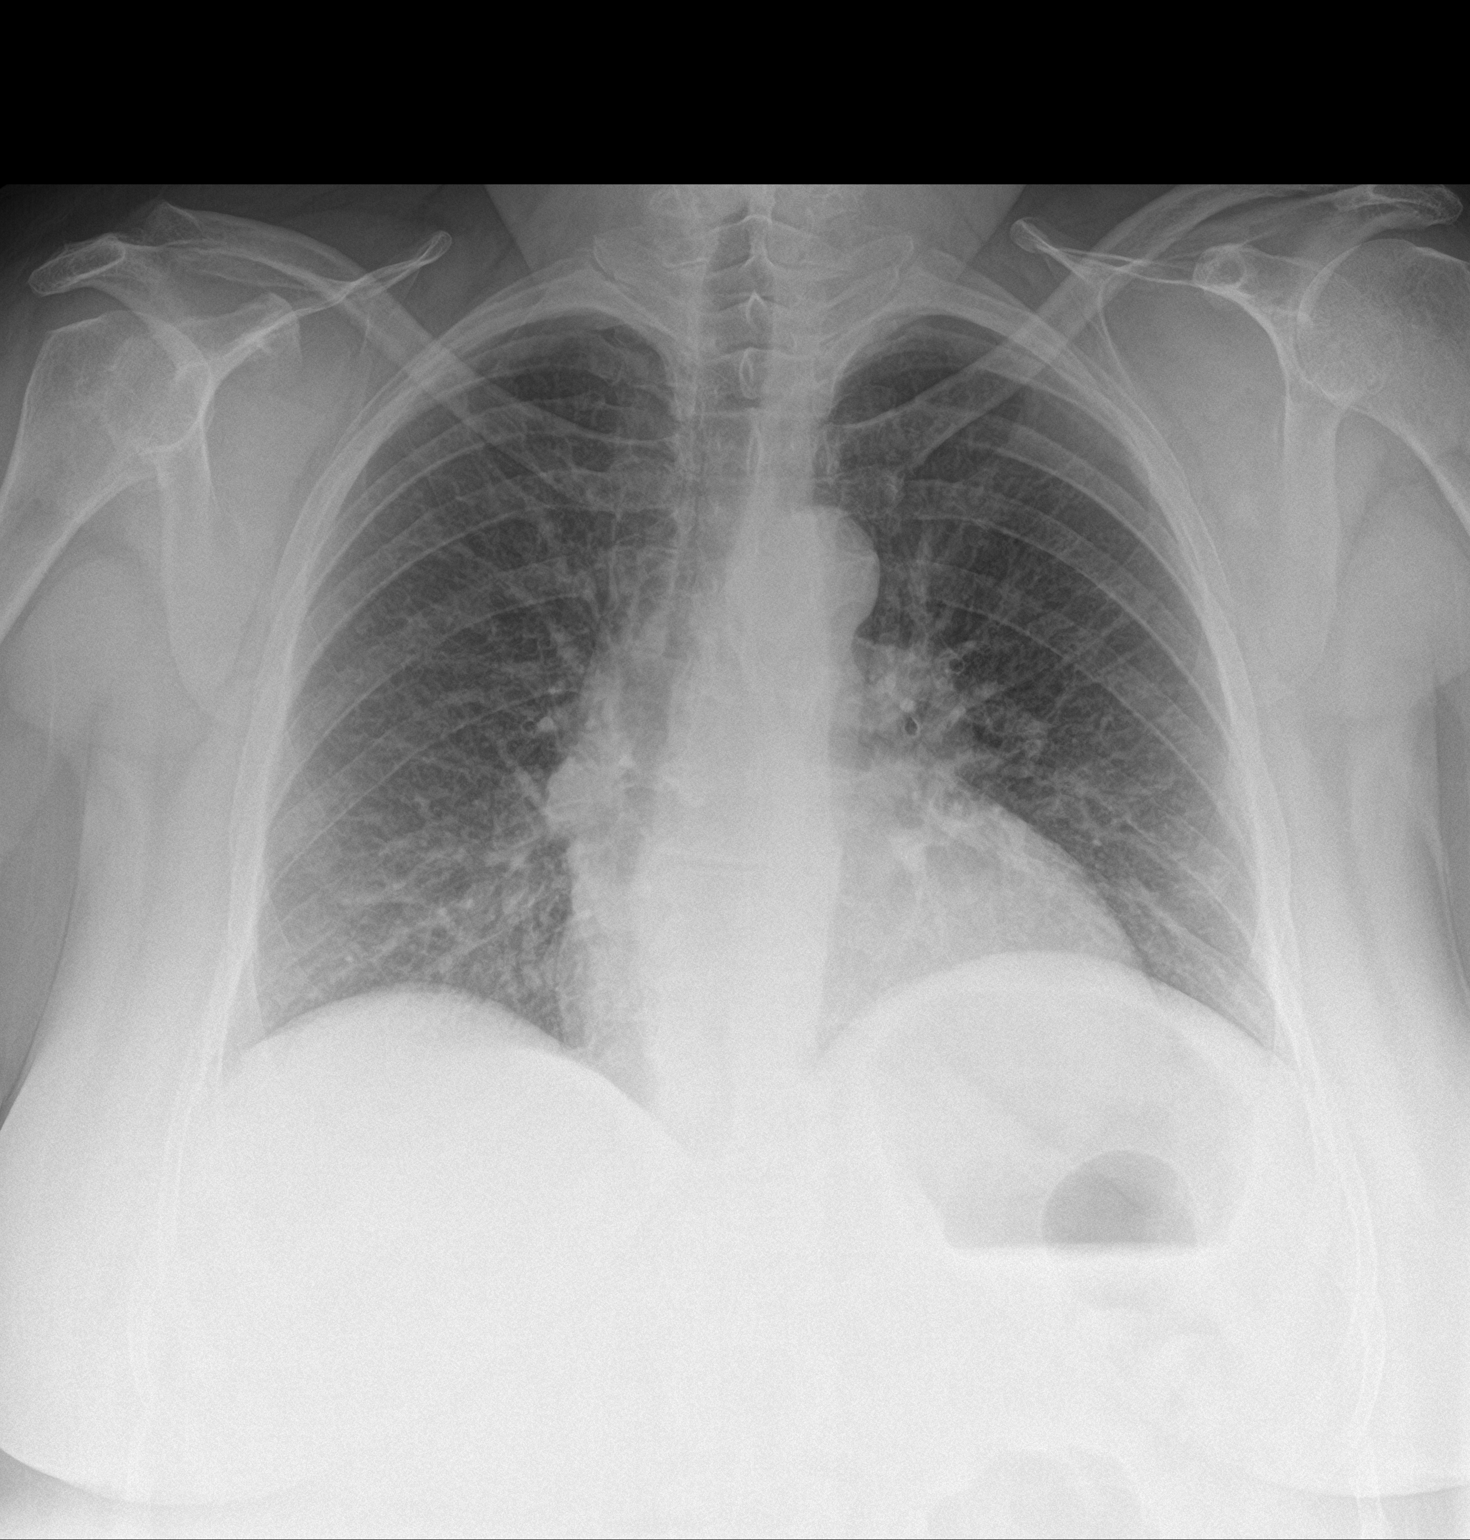

[chest lat]
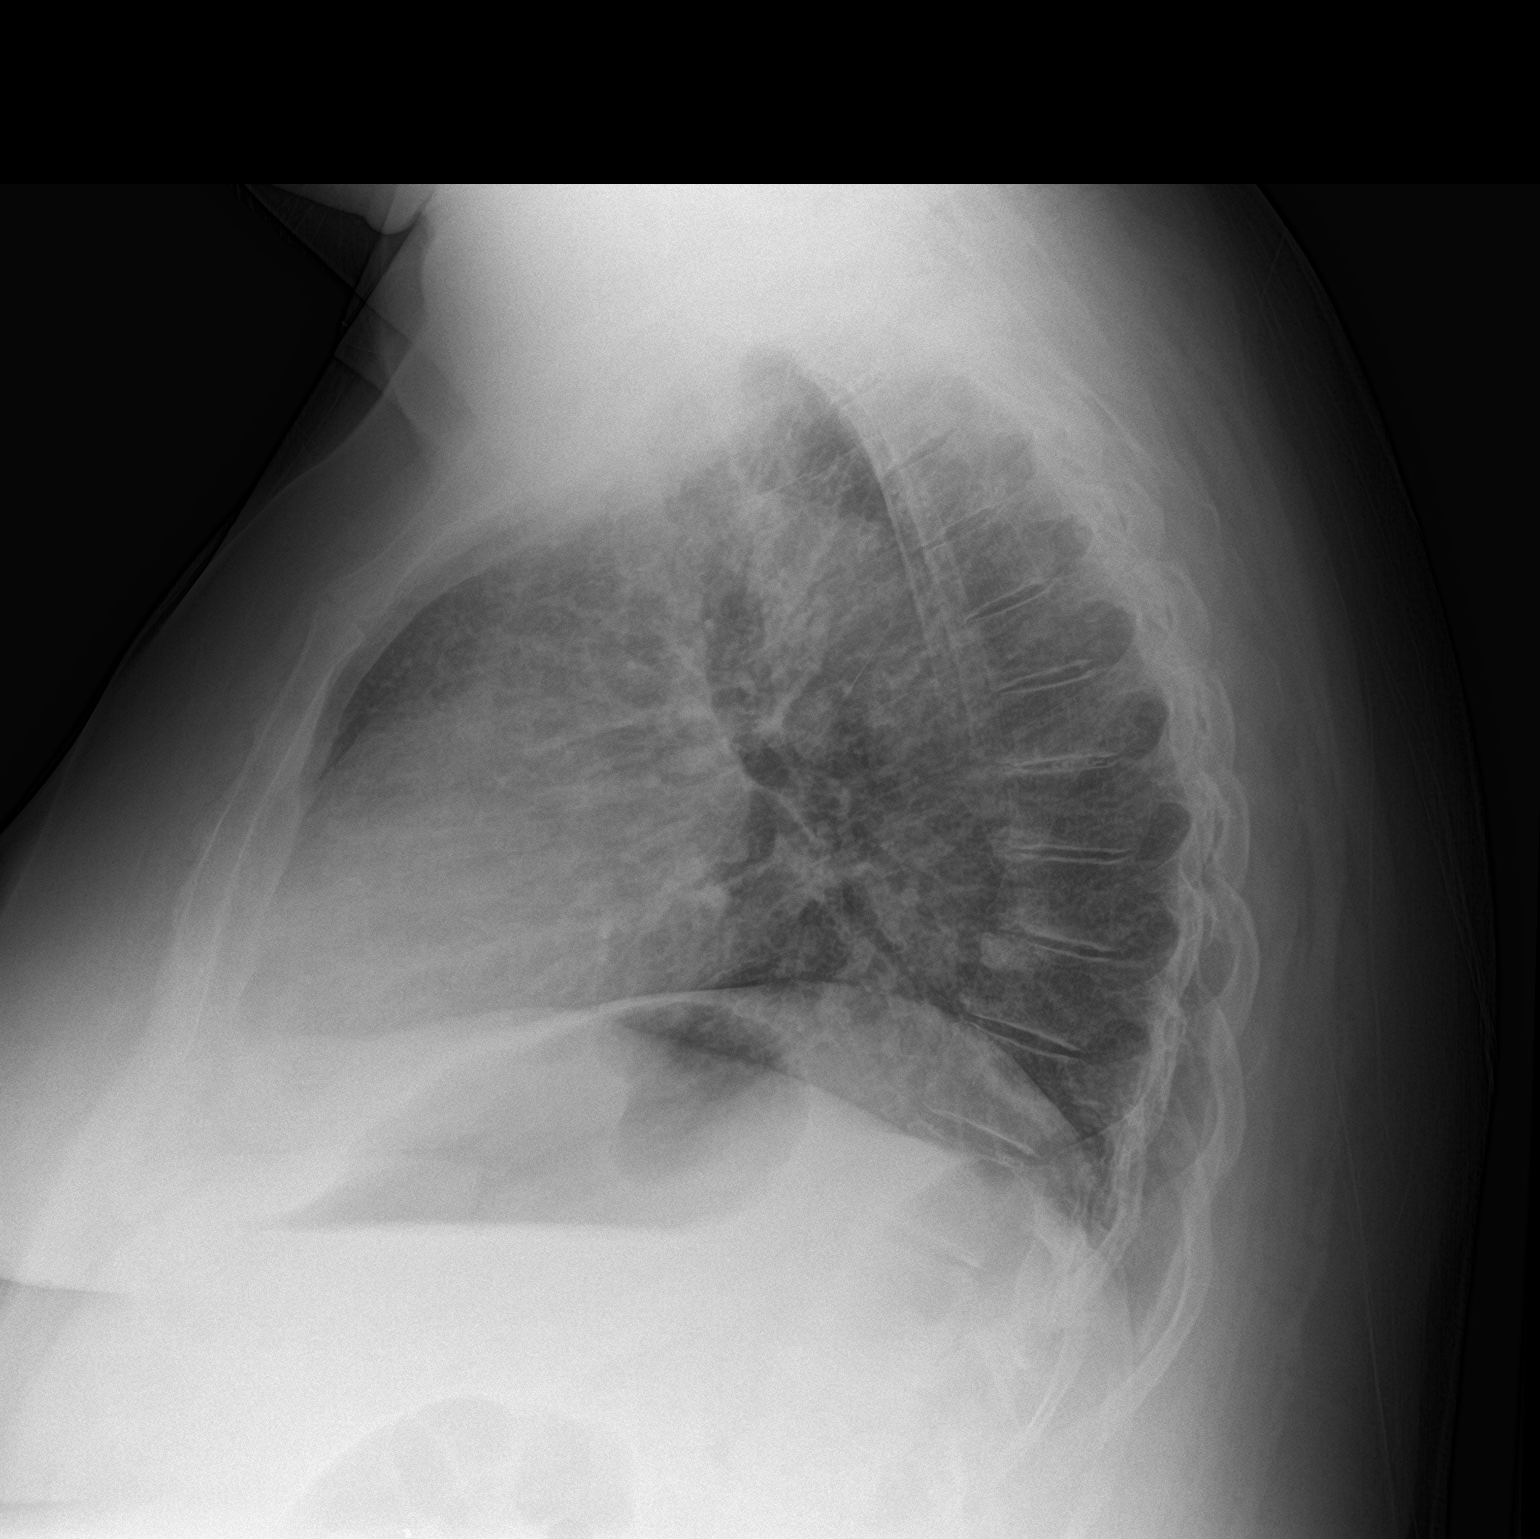

[2 of 2 positions shown; findings below may reference images not displayed]

FINDINGS: Upper normal size of cardiac silhouette.

Mediastinal contours and pulmonary vascularity normal.

Peribronchial thickening without pulmonary infiltrate, pleural
effusion, or pneumothorax.

No acute osseous findings.
IMPRESSION: Bronchitic changes without infiltrate.

## 2021-11-03 NOTE — Patient Instructions (Signed)
Chest xray today to follow-up on shortness of breath We will let you know results and any changes to plan

## 2021-11-03 NOTE — Progress Notes (Signed)
Acute Office Visit  Subjective:    Patient ID: Jasmine Mejia, female    DOB: [birth year], 55 y.o.   MRN: [id number]  Chief Complaint  Patient presents with   Spirometry   Shortness of Breath     Patient is in today for spirometry. She reports ongoing shortness of breath. No new symptoms today.   She is waiting for a call back on her sleep study r/t her daytime sleepiness.     Past Medical History:  Diagnosis Date   Anxiety    Arthritis    Asthma    Depression    Hypertension    Pneumonia 2016   Pre-diabetes     Past Surgical History:  Procedure Laterality Date   APPENDECTOMY     CHOLECYSTECTOMY     CHONDROPLASTY Right 10/09/2020   Procedure: CHONDROPLASTY;  Surgeon: Hiram Gash, MD;  Location: [place];  Service: Orthopedics;  Laterality: Right;   KNEE ARTHROSCOPY WITH LATERAL MENISECTOMY Right 10/09/2020   Procedure: KNEE ARTHROSCOPY WITH LATERAL MENISECTOMY;  Surgeon: Hiram Gash, MD;  Location: [place];  Service: Orthopedics;  Laterality: Right;   KNEE ARTHROSCOPY WITH MEDIAL MENISECTOMY Right 10/09/2020   Procedure: KNEE ARTHROSCOPY WITH MEDIAL MENISECTOMY;  Surgeon: Hiram Gash, MD;  Location: [place];  Service: Orthopedics;  Laterality: Right;   KNEE ARTHROSCOPY WITH SUBCHONDROPLASTY Right 10/09/2020   Procedure: KNEE ARTHROSCOPY WITH SUBCHONDROPLASTY;  Surgeon: Hiram Gash, MD;  Location: [place];  Service: Orthopedics;  Laterality: Right;   TOTAL KNEE ARTHROPLASTY Right 05/30/2021   Procedure: TOTAL KNEE ARTHROPLASTY;  Surgeon: Dorna Leitz, MD;  Location: [facility];  Service: Orthopedics;  Laterality: Right;   TUBAL LIGATION      Family History  Problem Relation Age of Onset   Hypertension Mother    Heart attack Mother    Diabetes Mother    Hypertension Sister    Heart attack Sister    Diabetes Sister    Hypertension Brother    Heart attack Brother    Diabetes Brother     Colitis Neg Hx    Esophageal cancer Neg Hx    Stomach cancer Neg Hx    Rectal cancer Neg Hx     Social History   Socioeconomic History   Marital status: Significant Other    Spouse name: Not on file   Number of children: Not on file   Years of education: Not on file   Highest education level: Not on file  Occupational History   Not on file  Tobacco Use   Smoking status: Former    Packs/day: 1.00    Years: 37.00    Pack years: 37.00    Types: Cigarettes    Quit date: 05/21/2021    Years since quitting: 0.4   Smokeless tobacco: Never  Vaping Use   Vaping Use: Some days  Substance and Sexual Activity   Alcohol use: Not Currently   Drug use: Never   Sexual activity: Yes    Partners: Male    Birth control/protection: Post-menopausal  Other Topics Concern   Not on file  Social History Narrative   Not on file   Social Determinants of Health   Financial Resource Strain: Not on file  Food Insecurity: Not on file  Transportation Needs: Not on file  Physical Activity: Not on file  Stress: Not on file  Social Connections: Not on file  Intimate Partner Violence: Not on file  Outpatient Medications Prior to Visit  Medication Sig Dispense Refill   albuterol (VENTOLIN HFA) 108 (90 Base) MCG/ACT inhaler Inhale 2 puffs into the lungs every 6 (six) hours as needed for wheezing. 2 each 11   ARIPiprazole (ABILIFY) 5 MG tablet Take 5 mg by mouth daily.     aspirin EC 325 MG tablet Take 1 tablet (325 mg total) by mouth 2 (two) times daily after a meal. Take x 1 month post op to decrease risk of blood clots. 60 tablet 0   atorvastatin (LIPITOR) 40 MG tablet Take 1 tablet (40 mg total) by mouth daily. 90 tablet 3   gabapentin (NEURONTIN) 600 MG tablet Take 600 mg by mouth 3 (three) times daily.     lisinopril (ZESTRIL) 20 MG tablet Take 1 tablet (20 mg total) by mouth daily. 30 tablet 0   methadone (DOLOPHINE) 10 MG/5ML solution Take 40 mLs (80 mg total) by mouth daily.      mirtazapine (REMERON) 30 MG tablet Take 30 mg by mouth at bedtime.     Multiple Vitamin (MULTIVITAMIN WITH MINERALS) TABS tablet Take 1 tablet by mouth in the morning.     venlafaxine XR (EFFEXOR-XR) 150 MG 24 hr capsule TAKE 1 CAPSULE BY MOUTH DAILY WITH BREAKFAST. 30 capsule 1   docusate sodium (COLACE) 100 MG capsule Take 1 capsule (100 mg total) by mouth 2 (two) times daily. 30 capsule 0   predniSONE (DELTASONE) 50 MG tablet Take 1 tablet (50 mg total) by mouth daily. 5 tablet 0   tiZANidine (ZANAFLEX) 2 MG tablet Take 1 tablet (2 mg total) by mouth every 8 (eight) hours as needed for muscle spasms. 40 tablet 0   No facility-administered medications prior to visit.    No Known Allergies  Review of Systems  Respiratory:  Positive for shortness of breath.   All review of systems negative except what is listed in the HPI     Objective:    Physical Exam Constitutional:      Appearance: She is well-developed. She is obese.  HENT:     Head: Normocephalic and atraumatic.  Pulmonary:     Comments: Diminished throughout Skin:    General: Skin is warm and dry.  Neurological:     General: No focal deficit present.     Mental Status: She is alert and oriented to person, place, and time.  Psychiatric:        Mood and Affect: Mood normal.        Behavior: Behavior normal.    BP 136/83   Pulse 87   Temp 97.9 F (36.6 C)   Ht 5\' 6"  (1.676 m)   Wt 234 lb (106.1 kg)   LMP 04/06/2020   SpO2 (!) 9%   BMI 37.77 kg/m  Wt Readings from Last 3 Encounters:  11/03/21 234 lb (106.1 kg)  10/10/21 227 lb (103 kg)  07/01/21 226 lb (102.5 kg)        Assessment & Plan:   1. Shortness of breath 2. Continuous dependence on cigarette smoking Office Spirometry Results: FEV1 = 56% FEV1 post-test change = 6% FEV1/FVC: pretest = 76.6%; post-test = 77.3%  No evidence of obstruction, possible restriction. Given ongoing shortness of breath, will get follow-up CXR today to see if improved  compared to ED stay. Encouraged smoking cessation. Sleep study pending - she is waiting for a call to schedule.   - PR EVAL OF BRONCHOSPASM - DG Chest 2 View; Future   Follow-up pending results  or as needed.    Terrilyn Saver, NP

## 2021-11-03 NOTE — Progress Notes (Signed)
Pt presents today for spirometry testing.   Dx: shortness of breath, continuous dependence on cigarette smoking  Vitals obtained. Medication and allergy list reconciled.   Pt able to perform 3 good quality tests out of 4 tried. Two puffs of albuterol sulfate administered without complication.   After 10 minutes, post-test performed. Pt was able to perform 3 good quality tests out of 5 tries.   Report printed and given to Hyman Hopes, DNP, FNP-C.

## 2021-11-04 NOTE — Addendum Note (Signed)
Addended by: Hyman Hopes B on: 11/04/2021 01:59 PM   Modules accepted: Orders

## 2021-11-10 ENCOUNTER — Other Ambulatory Visit: Payer: Self-pay | Admitting: Medical-Surgical

## 2021-11-10 ENCOUNTER — Ambulatory Visit
Admission: RE | Admit: 2021-11-10 | Discharge: 2021-11-10 | Disposition: A | Discharge status: Home / Self Care | Payer: 59 | Source: Ambulatory Visit | Attending: Medical-Surgical | Admitting: Medical-Surgical

## 2021-11-10 ENCOUNTER — Ambulatory Visit
Admission: RE | Admit: 2021-11-10 | Discharge: 2021-11-10 | Disposition: A | Discharge status: Home / Self Care | Payer: Self-pay | Source: Ambulatory Visit | Attending: Medical-Surgical | Admitting: Medical-Surgical

## 2021-11-10 DIAGNOSIS — N63 Unspecified lump in unspecified breast: Secondary | ICD-10-CM

## 2021-11-10 IMAGING — US US BREAST*R* LIMITED INC AXILLA
1 series · 12 of 13 positions shown · non-contrast
Comparison: Previous exams including diagnostic mammogram and
ultrasound dated [DATE].

CLINICAL DATA: Follow-up for probably benign findings within each
breast. These probably benign findings were initially identified on
screening mammogram dated [DATE].



[Series 1: us breast*right* limited inc axilla · 0.06mm/px · 12 of 13 slices shown]
[im 1/13]
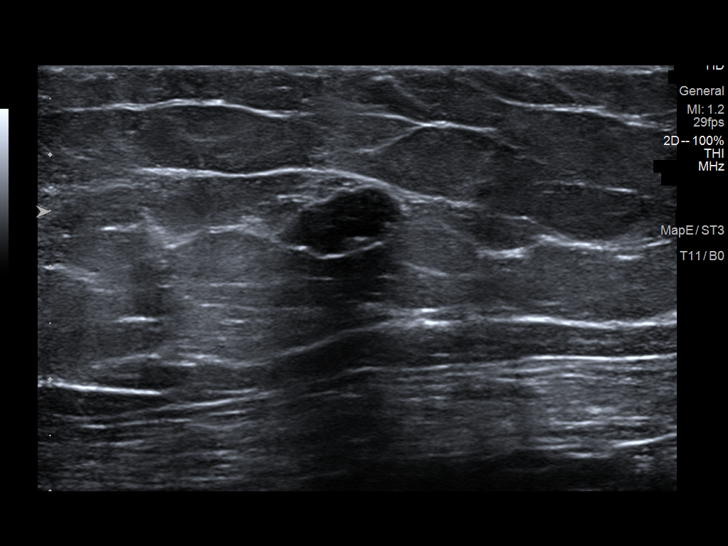
[im 2/13]
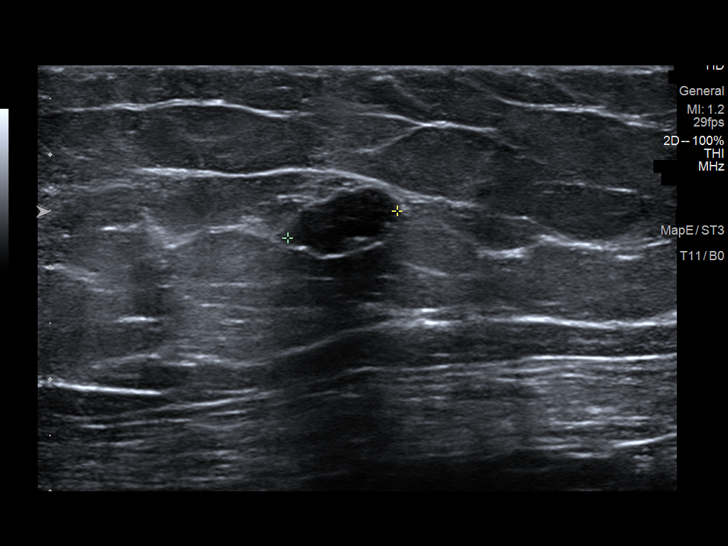
[im 3/13]
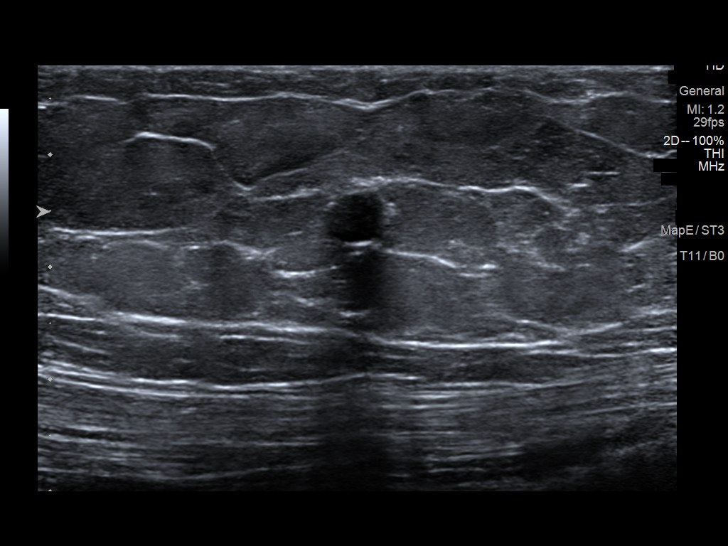
[im 4/13]
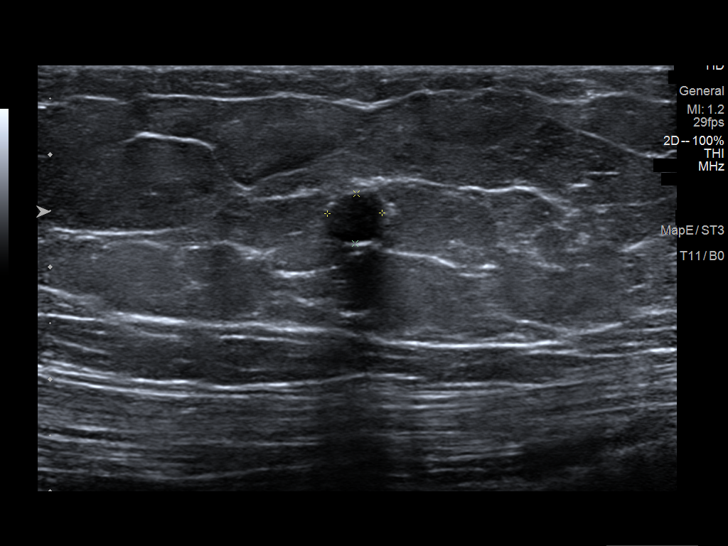
[im 5/13]
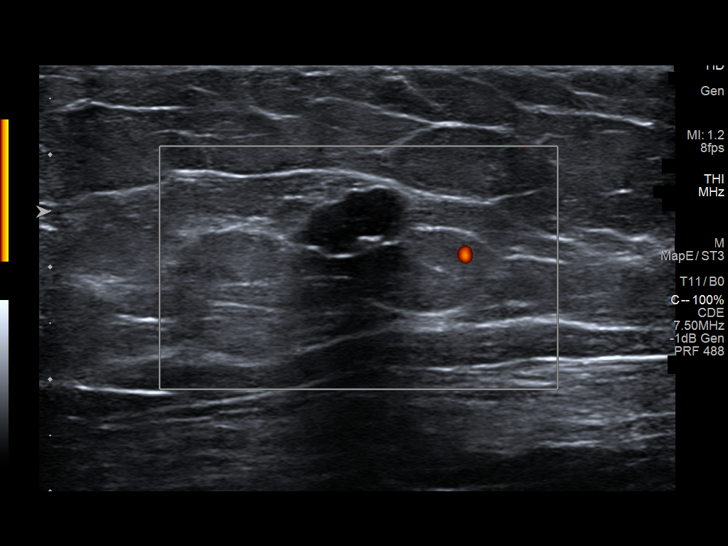
[im 6/13]
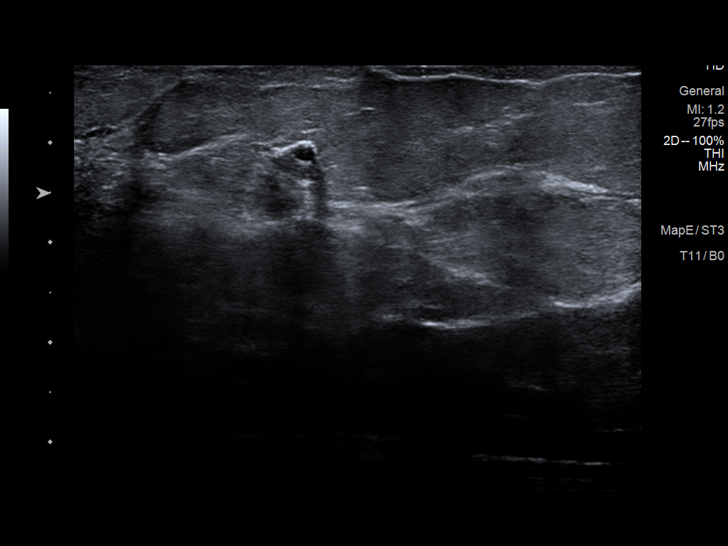
[im 8/13]
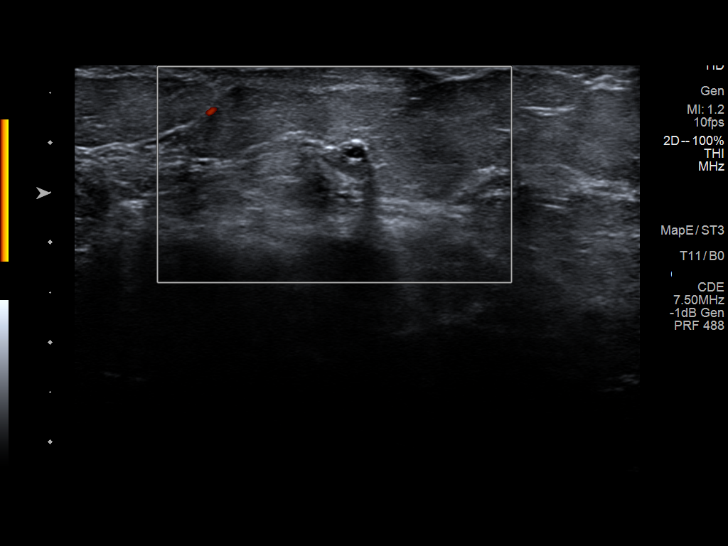
[im 9/13]
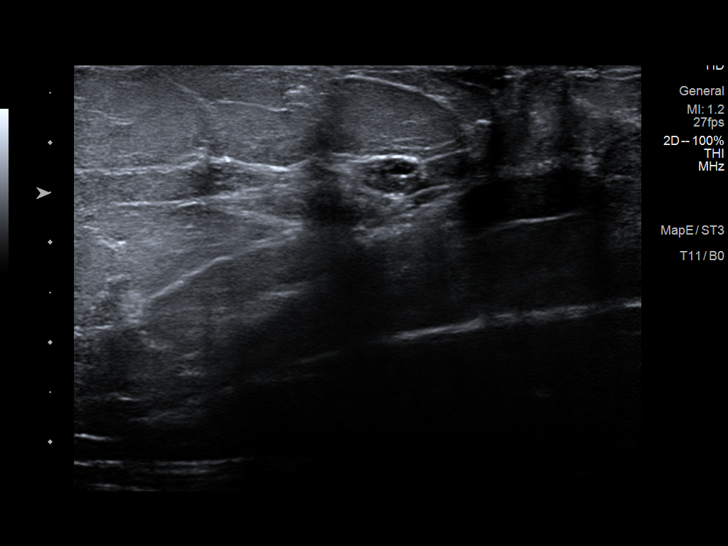
[im 10/13]
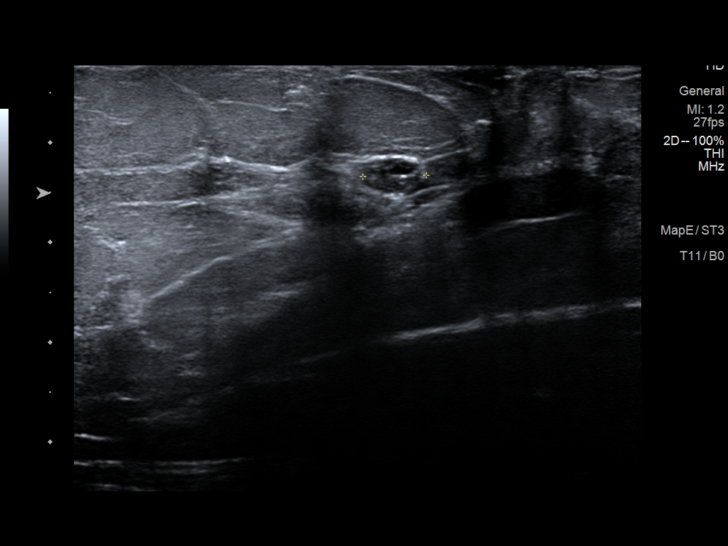
[im 11/13]
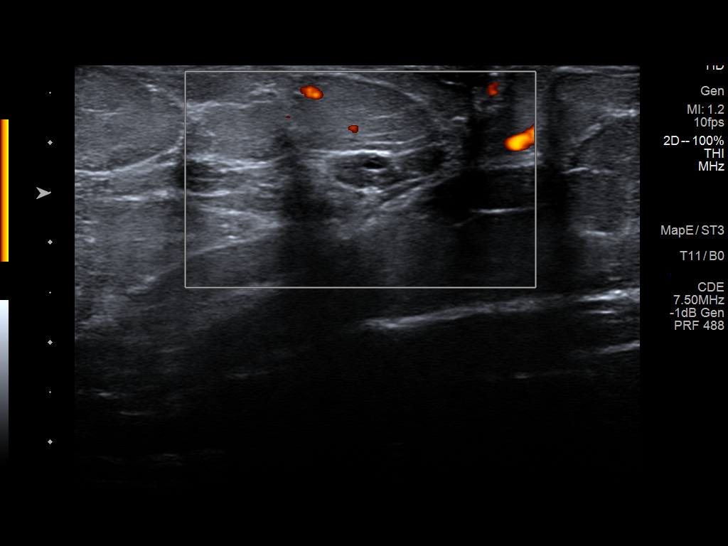
[im 12/13]
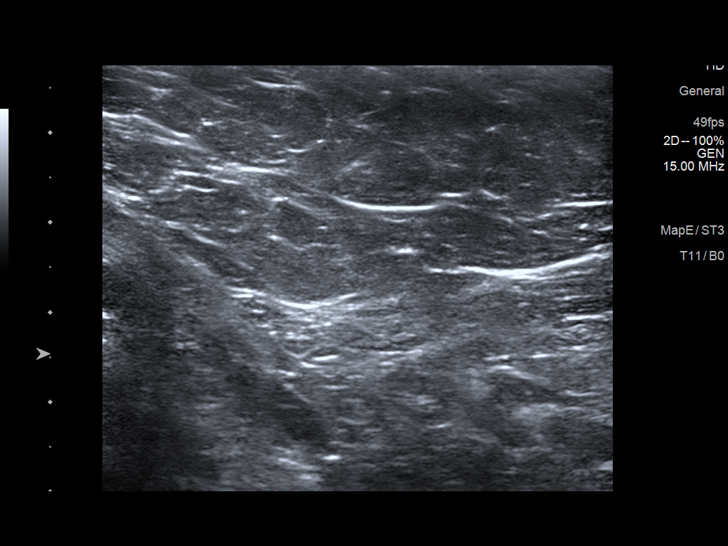
[im 13/13]
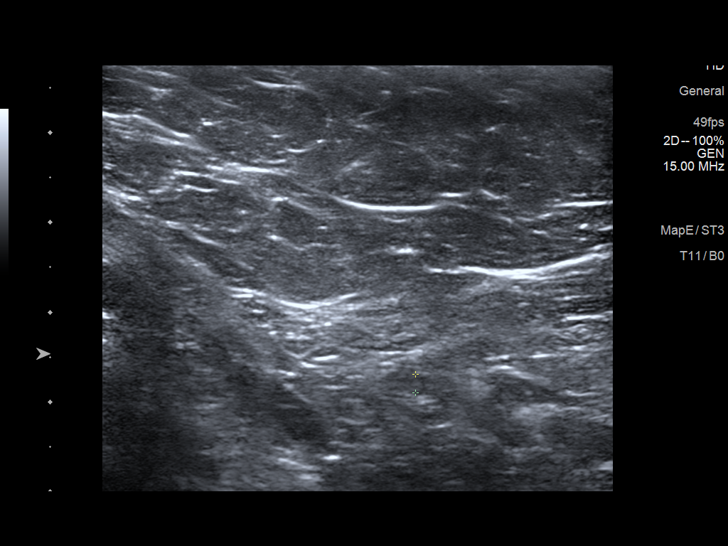

[12 of 13 positions shown; findings below may reference images not displayed]

ACR Breast Density Category b: There are scattered areas of
fibroglandular density.
FINDINGS: RIGHT breast: The oval circumscribed mass within the upper RIGHT
breast appears stable. There is an additional partially obscured
mass within the lower retroareolar RIGHT breast, at anterior depth,
measuring approximately 5 mm greatest dimension

LEFT breast: The previously described mass within upper LEFT breast
is not seen on today's exam. There are no new dominant masses,
suspicious calcifications or secondary signs of malignancy within
the LEFT breast.

RIGHT breast: Targeted ultrasound is performed, showing an oval
circumscribed hypoechoic mass in the RIGHT breast at the 6 o'clock
axis, retroareolar, measuring 6 x 3 x 5 mm, corresponding to the
mammographic finding, most likely a complicated cyst or benign
apocrine metaplasia.

Again shown is an oval circumscribed hypoechoic mass in the RIGHT
breast at the 1 o'clock axis, 10 cm from the nipple, measuring 1 x
0.4 x 0.5 cm, stable compared to the earliest ultrasound of
[DATE] and again most likely a benign fibroadenoma.

LEFT breast: The probably benign mass within the LEFT breast at the
12 o'clock axis, 5 cm from nipple, has decreased in size with a
current measurement of 3 mm (previously 7 mm), confirming benignity,
compatible with a benign cyst. No suspicious solid or cystic mass is
identified within the upper LEFT breast by ultrasound.
IMPRESSION: 1. Probably benign hypoechoic mass in the RIGHT breast at the 6
o'clock axis, retroareolar, measuring 6 mm, most likely a
complicated cyst or apocrine metaplasia. Recommend follow-up RIGHT
breast diagnostic mammogram and ultrasound in 6 months to ensure
stability.
2. Stable probably benign fibroadenoma in the RIGHT breast at the 1
o'clock axis, 10 cm from the nipple, measuring 1 cm, now shown
stable for greater than 1 year. Recommend attention to this mass as
well on the follow-up RIGHT breast diagnostic mammogram and
ultrasound recommended above.
3. No evidence of malignancy within the LEFT breast. Decreased size
of the LEFT breast mass at the 12 o'clock axis, confirming
benignity, compatible with a benign cyst. No additional follow-up
diagnostic imaging is necessary for the LEFT breast.

RECOMMENDATION:
RIGHT breast diagnostic mammogram and ultrasound in 6 months.

I have discussed the findings and recommendations with the patient.
If applicable, a reminder letter will be sent to the patient
regarding the next appointment.

BI-RADS CATEGORY  3: Probably benign.

## 2021-11-10 IMAGING — US US BREAST*L* LIMITED INC AXILLA
1 series · 4 of 4 positions shown · non-contrast
Comparison: Previous exams including diagnostic mammogram and
ultrasound dated [DATE].

CLINICAL DATA: Follow-up for probably benign findings within each
breast. These probably benign findings were initially identified on
screening mammogram dated [DATE].



[Series 1: us breast*left* limited inc axilla · 0.07mm/px · 4 of 4 slices shown]
[im 1/4]
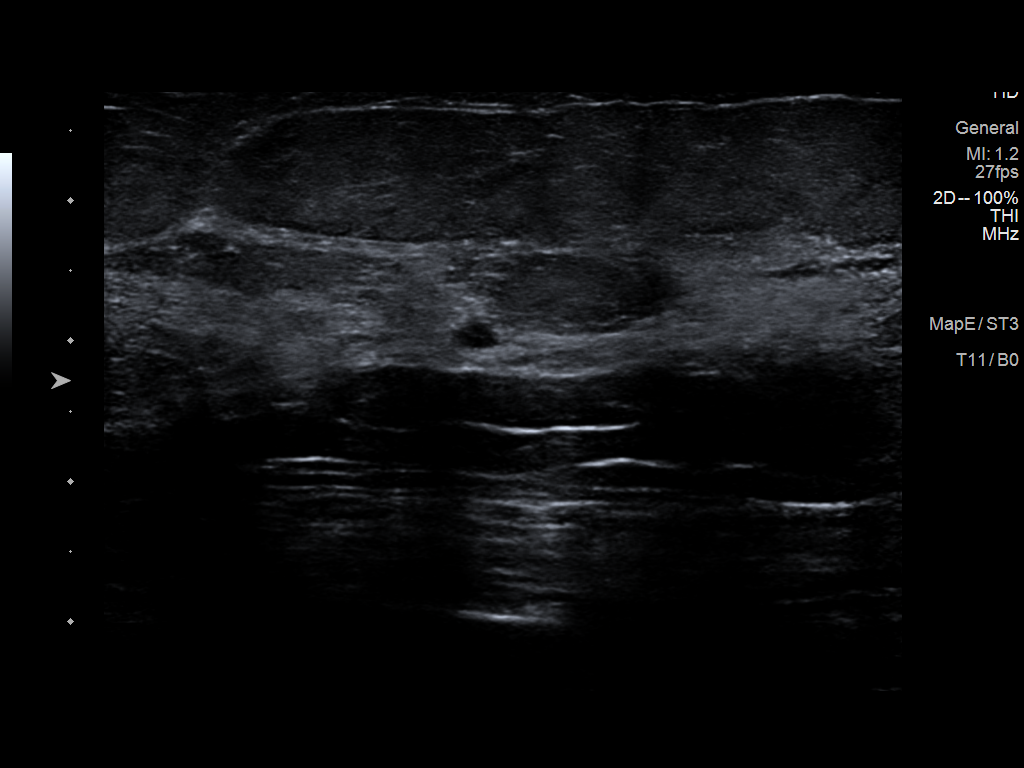
[im 2/4]
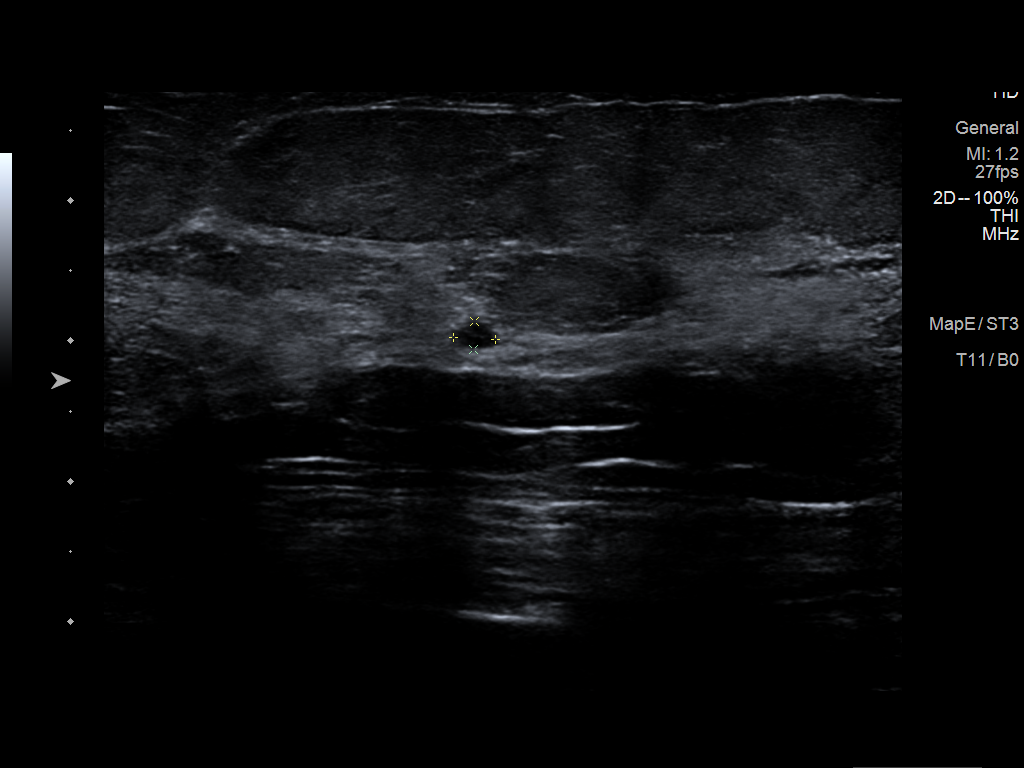
[im 3/4]
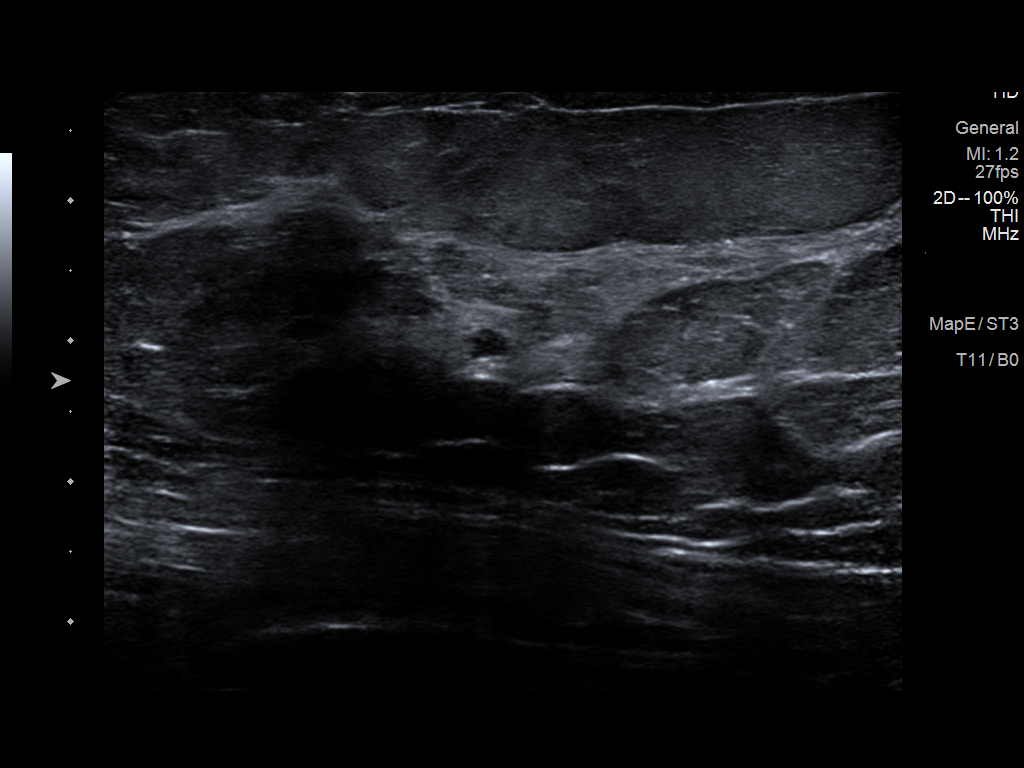
[im 4/4]
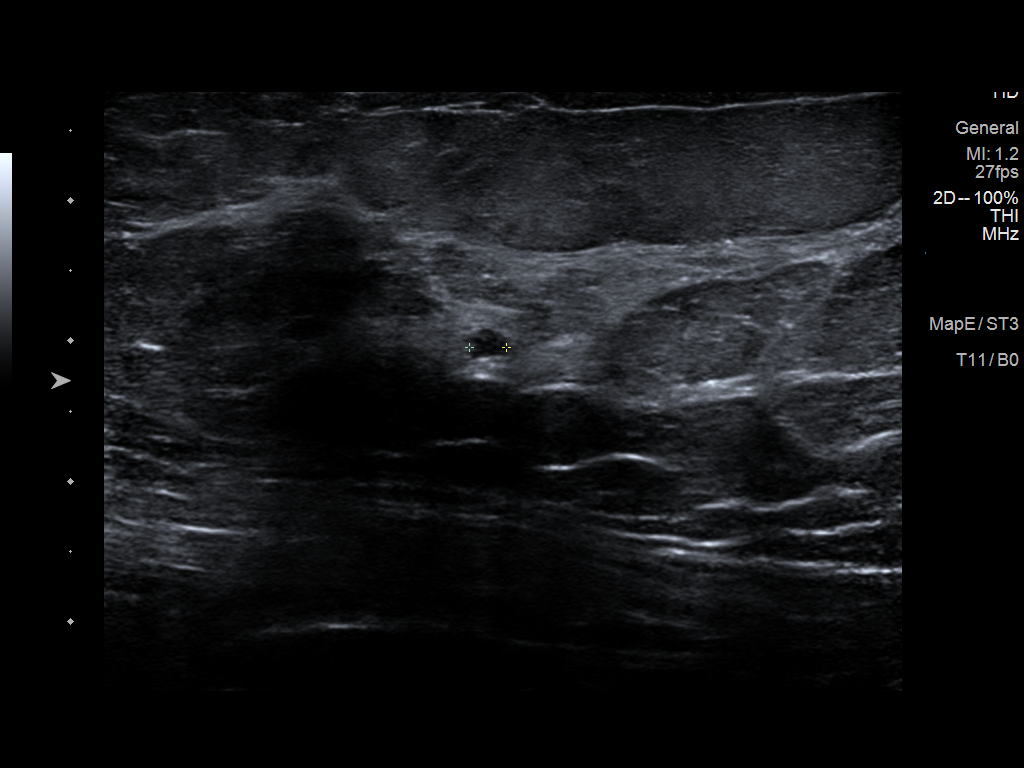

[4 of 4 positions shown; findings below may reference images not displayed]

ACR Breast Density Category b: There are scattered areas of
fibroglandular density.
FINDINGS: RIGHT breast: The oval circumscribed mass within the upper RIGHT
breast appears stable. There is an additional partially obscured
mass within the lower retroareolar RIGHT breast, at anterior depth,
measuring approximately 5 mm greatest dimension

LEFT breast: The previously described mass within upper LEFT breast
is not seen on today's exam. There are no new dominant masses,
suspicious calcifications or secondary signs of malignancy within
the LEFT breast.

RIGHT breast: Targeted ultrasound is performed, showing an oval
circumscribed hypoechoic mass in the RIGHT breast at the 6 o'clock
axis, retroareolar, measuring 6 x 3 x 5 mm, corresponding to the
mammographic finding, most likely a complicated cyst or benign
apocrine metaplasia.

Again shown is an oval circumscribed hypoechoic mass in the RIGHT
breast at the 1 o'clock axis, 10 cm from the nipple, measuring 1 x
0.4 x 0.5 cm, stable compared to the earliest ultrasound of
[DATE] and again most likely a benign fibroadenoma.

LEFT breast: The probably benign mass within the LEFT breast at the
12 o'clock axis, 5 cm from nipple, has decreased in size with a
current measurement of 3 mm (previously 7 mm), confirming benignity,
compatible with a benign cyst. No suspicious solid or cystic mass is
identified within the upper LEFT breast by ultrasound.
IMPRESSION: 1. Probably benign hypoechoic mass in the RIGHT breast at the 6
o'clock axis, retroareolar, measuring 6 mm, most likely a
complicated cyst or apocrine metaplasia. Recommend follow-up RIGHT
breast diagnostic mammogram and ultrasound in 6 months to ensure
stability.
2. Stable probably benign fibroadenoma in the RIGHT breast at the 1
o'clock axis, 10 cm from the nipple, measuring 1 cm, now shown
stable for greater than 1 year. Recommend attention to this mass as
well on the follow-up RIGHT breast diagnostic mammogram and
ultrasound recommended above.
3. No evidence of malignancy within the LEFT breast. Decreased size
of the LEFT breast mass at the 12 o'clock axis, confirming
benignity, compatible with a benign cyst. No additional follow-up
diagnostic imaging is necessary for the LEFT breast.

RECOMMENDATION:
RIGHT breast diagnostic mammogram and ultrasound in 6 months.

I have discussed the findings and recommendations with the patient.
If applicable, a reminder letter will be sent to the patient
regarding the next appointment.

BI-RADS CATEGORY  3: Probably benign.

## 2021-11-10 IMAGING — MG DIGITAL DIAGNOSTIC BILAT W/ TOMO W/ CAD
5 of 10 series · 5 of 30 positions shown · non-contrast
Comparison: Previous exams including diagnostic mammogram and
ultrasound dated [DATE].

CLINICAL DATA: Follow-up for probably benign findings within each
breast. These probably benign findings were initially identified on
screening mammogram dated [DATE].



[R MLO synth-2D (1 of 2)]
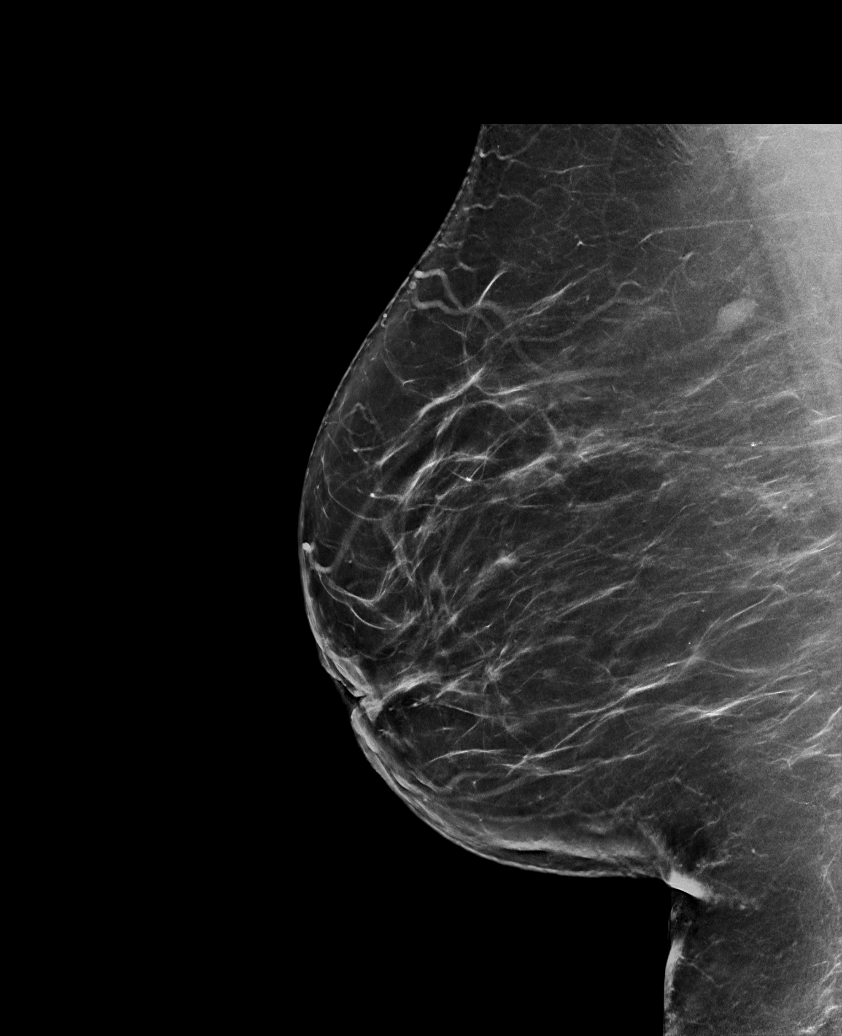

[L MLO synth-2D]
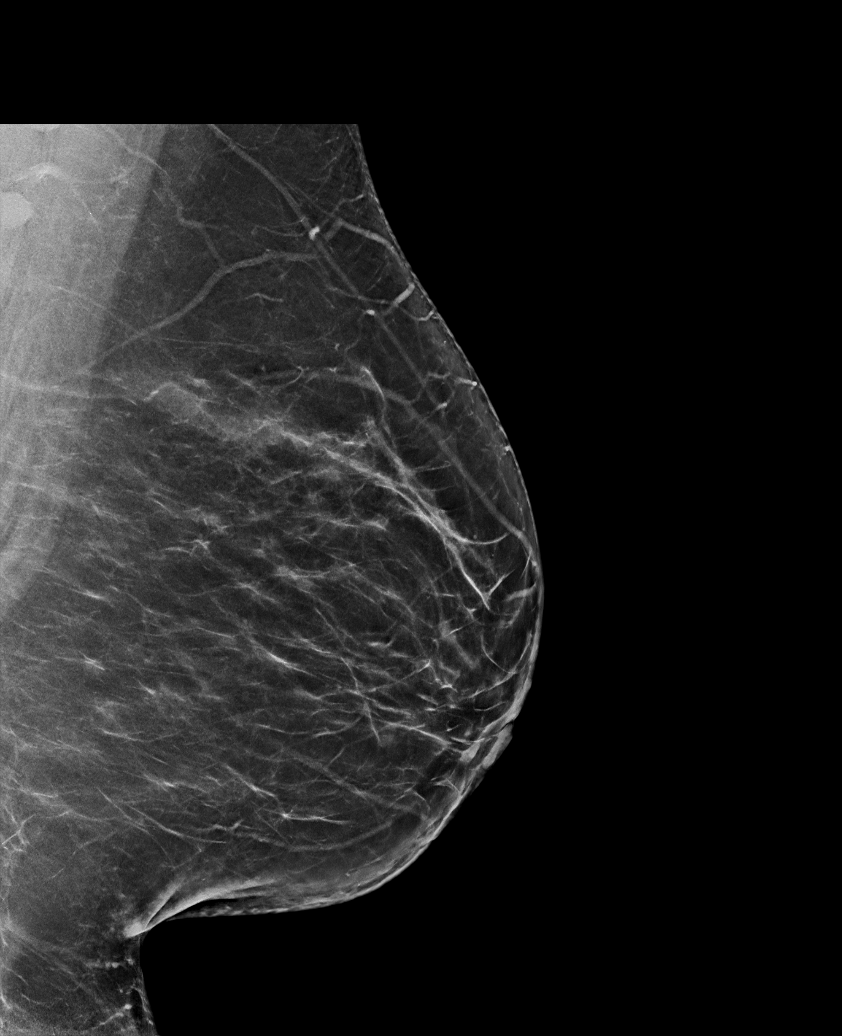

[L CC synth-2D]
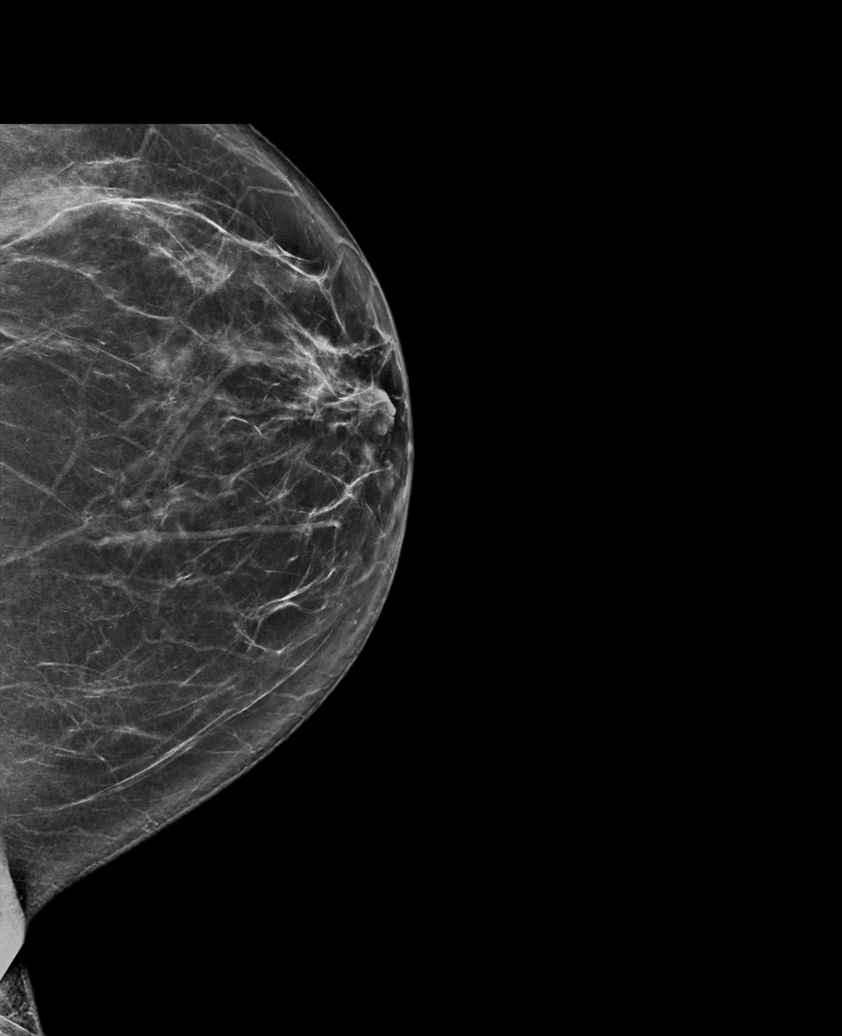

[R MLO synth-2D (2 of 2)]
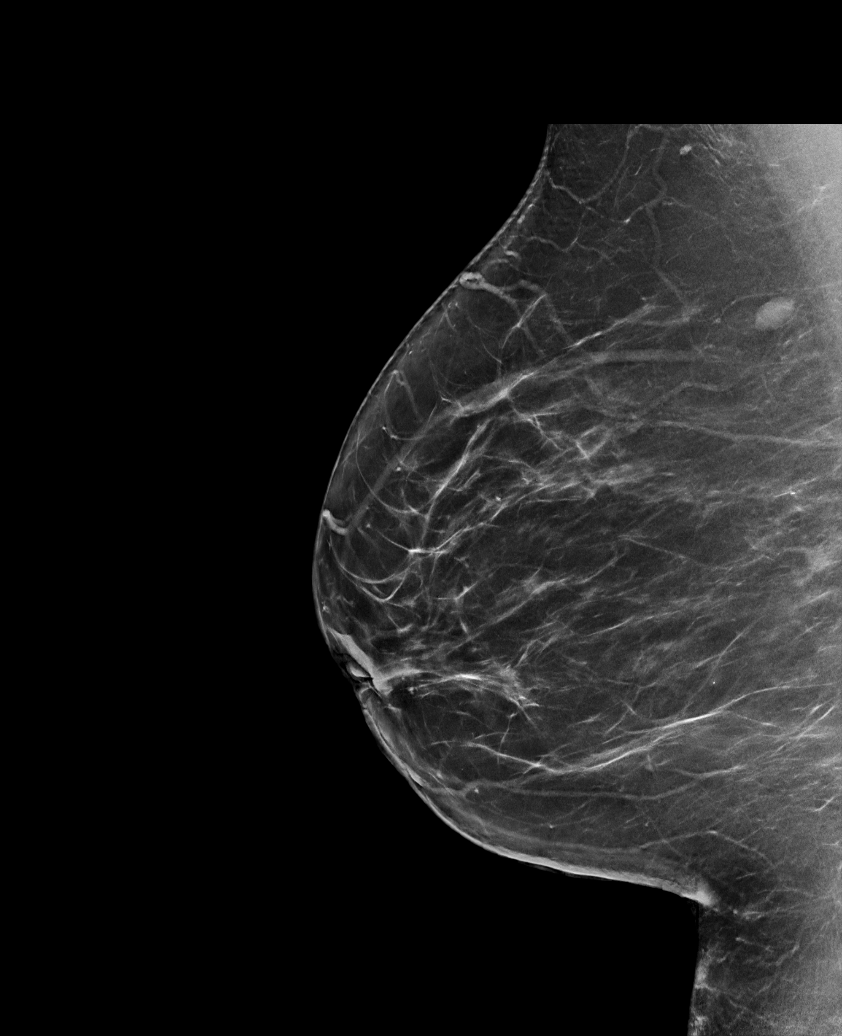

[R CC synth-2D]
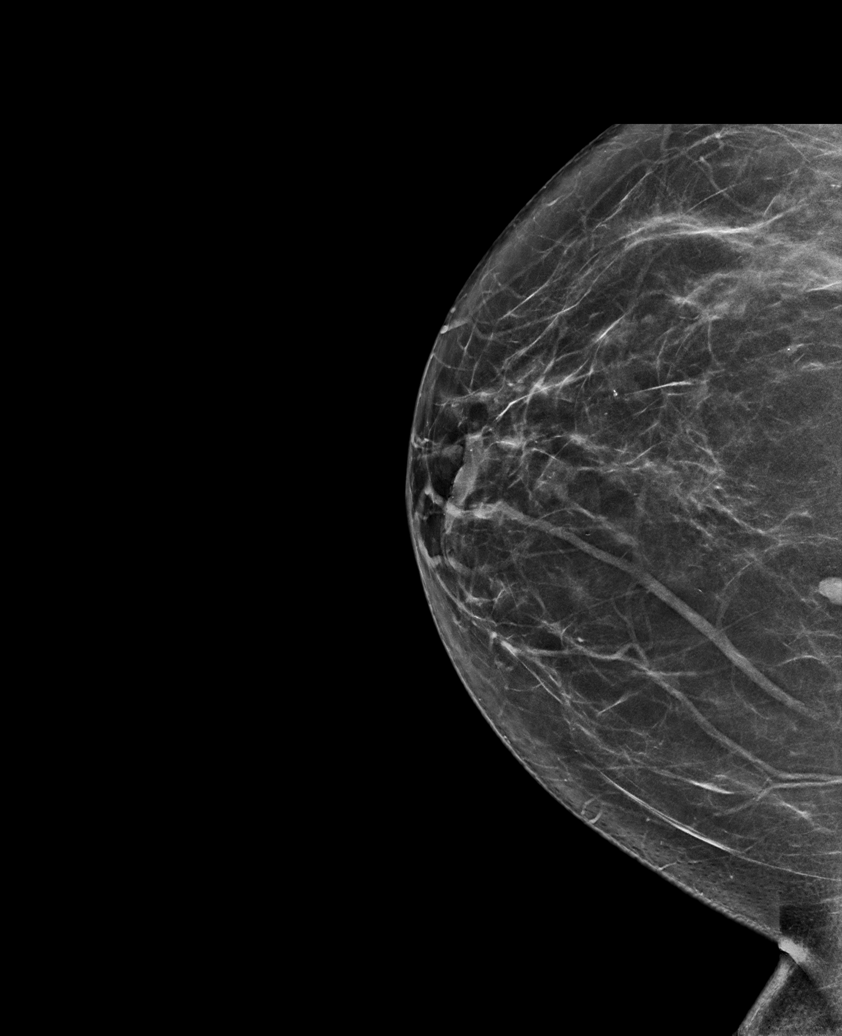

[5 of 30 positions shown; findings below may reference images not displayed]

ACR Breast Density Category b: There are scattered areas of
fibroglandular density.
FINDINGS: RIGHT breast: The oval circumscribed mass within the upper RIGHT
breast appears stable. There is an additional partially obscured
mass within the lower retroareolar RIGHT breast, at anterior depth,
measuring approximately 5 mm greatest dimension

LEFT breast: The previously described mass within upper LEFT breast
is not seen on today's exam. There are no new dominant masses,
suspicious calcifications or secondary signs of malignancy within
the LEFT breast.

RIGHT breast: Targeted ultrasound is performed, showing an oval
circumscribed hypoechoic mass in the RIGHT breast at the 6 o'clock
axis, retroareolar, measuring 6 x 3 x 5 mm, corresponding to the
mammographic finding, most likely a complicated cyst or benign
apocrine metaplasia.

Again shown is an oval circumscribed hypoechoic mass in the RIGHT
breast at the 1 o'clock axis, 10 cm from the nipple, measuring 1 x
0.4 x 0.5 cm, stable compared to the earliest ultrasound of
[DATE] and again most likely a benign fibroadenoma.

LEFT breast: The probably benign mass within the LEFT breast at the
12 o'clock axis, 5 cm from nipple, has decreased in size with a
current measurement of 3 mm (previously 7 mm), confirming benignity,
compatible with a benign cyst. No suspicious solid or cystic mass is
identified within the upper LEFT breast by ultrasound.
IMPRESSION: 1. Probably benign hypoechoic mass in the RIGHT breast at the 6
o'clock axis, retroareolar, measuring 6 mm, most likely a
complicated cyst or apocrine metaplasia. Recommend follow-up RIGHT
breast diagnostic mammogram and ultrasound in 6 months to ensure
stability.
2. Stable probably benign fibroadenoma in the RIGHT breast at the 1
o'clock axis, 10 cm from the nipple, measuring 1 cm, now shown
stable for greater than 1 year. Recommend attention to this mass as
well on the follow-up RIGHT breast diagnostic mammogram and
ultrasound recommended above.
3. No evidence of malignancy within the LEFT breast. Decreased size
of the LEFT breast mass at the 12 o'clock axis, confirming
benignity, compatible with a benign cyst. No additional follow-up
diagnostic imaging is necessary for the LEFT breast.

RECOMMENDATION:
RIGHT breast diagnostic mammogram and ultrasound in 6 months.

I have discussed the findings and recommendations with the patient.
If applicable, a reminder letter will be sent to the patient
regarding the next appointment.

BI-RADS CATEGORY  3: Probably benign.

## 2021-11-11 ENCOUNTER — Ambulatory Visit (INDEPENDENT_AMBULATORY_CARE_PROVIDER_SITE_OTHER): Payer: 59 | Admitting: Internal Medicine

## 2021-11-11 ENCOUNTER — Encounter: Payer: Self-pay | Admitting: Internal Medicine

## 2021-11-11 ENCOUNTER — Other Ambulatory Visit: Payer: Self-pay

## 2021-11-11 VITALS — BP 138/94 | HR 95 | Temp 98.3°F | Ht 66.0 in | Wt 229.0 lb

## 2021-11-11 DIAGNOSIS — J410 Simple chronic bronchitis: Secondary | ICD-10-CM | POA: Diagnosis not present

## 2021-11-11 DIAGNOSIS — F17210 Nicotine dependence, cigarettes, uncomplicated: Secondary | ICD-10-CM | POA: Diagnosis not present

## 2021-11-11 DIAGNOSIS — G4733 Obstructive sleep apnea (adult) (pediatric): Secondary | ICD-10-CM | POA: Diagnosis not present

## 2021-11-11 DIAGNOSIS — F17200 Nicotine dependence, unspecified, uncomplicated: Secondary | ICD-10-CM

## 2021-11-11 MED ORDER — NICOTINE 7 MG/24HR TD PT24: 7.0000 mg | MEDICATED_PATCH | Freq: Every day | TRANSDERMAL | 0 refills | Status: DC

## 2021-11-11 MED ORDER — NICOTINE 14 MG/24HR TD PT24: 14.0000 mg | MEDICATED_PATCH | Freq: Every day | TRANSDERMAL | 0 refills | Status: DC

## 2021-11-11 MED ORDER — NICOTINE 21 MG/24HR TD PT24: 21.0000 mg | MEDICATED_PATCH | Freq: Every day | TRANSDERMAL | 0 refills | Status: DC

## 2021-11-11 NOTE — Patient Instructions (Signed)
Please schedule follow up scheduled with myself in 3 months.  If my schedule is not open yet, we will contact you with a reminder closer to that time. Please call [phone] if you haven't heard from [place] a month before.   Before your next visit I would like you to have: Home sleep apnea test - we will call you to schedule.   You need to follow up with me between 30-90 days after you receive your CPAP. Please call to reschedule your appointment with me if there is a delay in receiving CPAP.   Start with nicotine patches at 21, then go down to 14, then go down to 7 every two weeks.  What are the benefits of quitting smoking? Quitting smoking can lower your chances of getting or dying from heart disease, lung disease, kidney failure, infection, or cancer. It can also lower your chances of getting osteoporosis, a condition that makes your bones weak. Plus, quitting smoking can help your skin look younger and reduce the chances that you will have problems with sex.  Quitting smoking will improve your health no matter how old you are, and no matter how long or how much you have smoked.  What should I do if I want to quit smoking? The letters in the word "START" can help you remember the steps to take: S = Set a quit date. T = Tell family, friends, and the people around you that you plan to quit. A = Anticipate or plan ahead for the tough times you'll face while quitting. R = Remove cigarettes and other tobacco products from your home, car, and work. T = Talk to your doctor about getting help to quit.  How can my doctor or nurse help? Your doctor or nurse can give you advice on the best way to quit. He or she can also put you in touch with counselors or other people you can call for support. Plus, your doctor or nurse can give you medicines to: ?Reduce your craving for cigarettes ?Reduce the unpleasant symptoms that happen when you stop smoking (called "withdrawal symptoms"). You can also get  help from a free phone line (1-800-QUIT-NOW) or go online to [employer].  What are the symptoms of withdrawal? The symptoms include: ?Trouble sleeping ?Being irritable, anxious or restless ?Getting frustrated or angry ?Having trouble thinking clearly  Some people who stop smoking become temporarily depressed. Some people need treatment for depression, such as counseling or antidepressant medicines. Depressed people might: ?No longer enjoy or care about doing the things they used to like to do ?Feel sad, down, hopeless, nervous, or cranky most of the day, almost every day ?Lose or gain weight ?Sleep too much or too little ?Feel tired or like they have no energy ?Feel guilty or like they are worth nothing ?Forget things or feel confused ?Move and speak more slowly than usual ?Act restless or have trouble staying still ?Think about death or suicide  If you think you might be depressed, see your doctor or nurse. Only someone trained in mental health can tell for sure if you are depressed. If you ever feel like you might hurt yourself, go straight to the nearest emergency department. Or you can call for an ambulance (in the [place] and [place], dial 9-1-1) or call your doctor or nurse right away and tell them it is an emergency. You can also reach the [place] National Suicide Prevention Lifeline at 628-082-7179 or http://hill.com/.  How do medicines help you stop smoking?  Different medicines work in different ways: ?Nicotine replacement therapy eases withdrawal and reduces your body's craving for nicotine, the main drug found in cigarettes. There are different forms of nicotine replacement, including skin patches, lozenges, gum, nasal sprays, and "puffers" or inhalers. Many can be bought without a prescription, while others might require one. ?Bupropion is a prescription medicine that reduces your desire to smoke. This medicine is sold under the brand names Zyban and Wellbutrin.  It is also available in a generic version, which is cheaper than brand name medicines. ?Varenicline (brand names: Chantix, Champix) is a prescription medicine that reduces withdrawal symptoms and cigarette cravings. If you think you'd like to take varenicline and you have a history of depression, anxiety, or heart disease, discuss this with your doctor or nurse before taking the medicine. Varenicline can also increase the effects of alcohol in some people. It's a good idea to limit drinking while you're taking it, at least until you know how it affects you.  How does counseling work? Counseling can happen during formal office visits or just over the phone. A counselor can help you: ?Figure out what triggers your smoking and what to do instead ?Overcome cravings ?Figure out what went wrong when you tried to quit before  What works best? Studies show that people have the best luck at quitting if they take medicines to help them quit and work with a Veterinary surgeon. It might also be helpful to combine nicotine replacement with one of the prescription medicines that help people quit. In some cases, it might even make sense to take bupropion and varenicline together.  What about e-cigarettes? Sometimes people wonder if using electronic cigarettes, or "e-cigarettes," might help them quit smoking. Using e-cigarettes is also called "vaping." Doctors do not recommend e-cigarettes in place of medicines and counseling. That's because e-cigarettes still contain nicotine as well as other substances that might be harmful. It's not clear how they can affect a person's health in the long term.  Will I gain weight if I quit? Yes, you might gain a few pounds. But quitting smoking will have a much more positive effect on your health than weighing a few pounds more. Plus, you can help prevent some weight gain by being more active and eating less. Taking the medicine bupropion might help control weight gain.   What else can  I do to improve my chances of quitting? You can: ?Start exercising. ?Stay away from smokers and places that you associate with smoking. If people close to you smoke, ask them to quit with you. ?Keep gum, hard candy, or something to put in your mouth handy. If you get a craving for a cigarette, try one of these instead. ?Don't give up, even if you start smoking again. It takes most people a few tries before they succeed.  What if I am pregnant and I smoke? If you are pregnant, it's really important for the health of your baby that you quit. Ask your doctor what options you have, and what is safest for your baby

## 2021-11-11 NOTE — Progress Notes (Signed)
Jasmine Mejia    [id number]    23-May-1966  Primary Care Physician:Jessup, Caryl Asp, NP  Referring Physician: Terrilyn Saver, NP [phone] [address] [place],  [place] [address] Reason for Consultation: shortness of breath Date of Consultation: 11/11/2021  Chief complaint:   Chief Complaint  Patient presents with   Consult    Sob during exertion, when pt is sleeping she feels as if she cant breathe.      HPI: Jasmine Mejia is a 55 y.o. woman who presents for new patient evaluation of shortness of breath. She notes being startled awake for at least the past couple of years. She does snore, per boyfriend.   She additional has dyspnea with exertion going on the last few months. She has wheezing with occasional coughing. Worse with bending over. She has an albuterol inhaler which does help her breathing a little bit.  She does have bronchitis last time was in October 2022 treated with steroids and antibiotics. No childhood respiratory disease.    OBSTRUCTIVE SLEEP APNEA SCREENING  1.  Snoring?:  Yes 2.  Tired?:  Yes 3.  Observed apnea, stop breathing or choking/gasping during sleep?:  Yes 4.  Pressure. HTN history?  Yes 5.  BMI more than 35 kg/m2?  Yes 6.  Age more than 2 yrs?  Yes 7.  Neck size larger than 17 in for female or 16 in for female?  No 8.  Gender = Female?  No  Total:  6  For general population  OSA - Low Risk : Yes to 0 - 2 questions OSA - Intermediate Risk : Yes to 3 - 4 questions OSA - High Risk : Yes to 5 - 8 questions  or Yes to 2 or more of 4 STOP questions + female gender or Yes to 2 or more of 4 STOP questions + BMI > 35kg/m2  or Yes to 2 or more of 4 STOP questions + neck circumference 17 inches / 43cm in female or 16 inches / 41cm in female  References: Rinaldo Cloud al. Anesthesiology 2008; 108: 812-821,  Gabriel Cirri et al Br Dara Hoyer 2012; 108: [id number],  Gabriel Cirri et al J Clin Sleep Med Sept 2014.   Social history:  Occupation: works as  Veterinary surgeon.  Exposures: lives at home with boyfriend.  Smoking history: 40 years x 2 ppd = 80 pack years now down to 1 ppd.   Social History   Occupational History   Not on file  Tobacco Use   Smoking status: Every Day    Packs/day: 1.00    Years: 37.00    Pack years: 37.00    Types: Cigarettes    Last attempt to quit: 05/21/2021    Years since quitting: 0.4   Smokeless tobacco: Never   Tobacco comments:    1 ppd  Vaping Use   Vaping Use: Some days  Substance and Sexual Activity   Alcohol use: Not Currently   Drug use: Never   Sexual activity: Yes    Partners: Male    Birth control/protection: Post-menopausal    Relevant family history:  Family History  Problem Relation Age of Onset   Hypertension Mother    Heart attack Mother    Diabetes Mother    Hypertension Sister    Heart attack Sister    Diabetes Sister    Hypertension Brother    Heart attack Brother    Diabetes Brother  Asthma Son    Colitis Neg Hx    Esophageal cancer Neg Hx    Stomach cancer Neg Hx    Rectal cancer Neg Hx     Past Medical History:  Diagnosis Date   Anxiety    Arthritis    Asthma    Depression    Hypertension    Pneumonia 2016   Pre-diabetes     Past Surgical History:  Procedure Laterality Date   APPENDECTOMY     CHOLECYSTECTOMY     CHONDROPLASTY Right 10/09/2020   Procedure: CHONDROPLASTY;  Surgeon: Bjorn Pippin, MD;  Location: [facility];  Service: Orthopedics;  Laterality: Right;   KNEE ARTHROSCOPY WITH LATERAL MENISECTOMY Right 10/09/2020   Procedure: KNEE ARTHROSCOPY WITH LATERAL MENISECTOMY;  Surgeon: Bjorn Pippin, MD;  Location: [facility];  Service: Orthopedics;  Laterality: Right;   KNEE ARTHROSCOPY WITH MEDIAL MENISECTOMY Right 10/09/2020   Procedure: KNEE ARTHROSCOPY WITH MEDIAL MENISECTOMY;  Surgeon: Bjorn Pippin, MD;  Location: [facility];  Service: Orthopedics;  Laterality: Right;   KNEE ARTHROSCOPY  WITH SUBCHONDROPLASTY Right 10/09/2020   Procedure: KNEE ARTHROSCOPY WITH SUBCHONDROPLASTY;  Surgeon: Bjorn Pippin, MD;  Location: [facility];  Service: Orthopedics;  Laterality: Right;   TOTAL KNEE ARTHROPLASTY Right 05/30/2021   Procedure: TOTAL KNEE ARTHROPLASTY;  Surgeon: Jodi Geralds, MD;  Location: [facility];  Service: Orthopedics;  Laterality: Right;   TUBAL LIGATION       Physical Exam: Blood pressure (!) 138/94, pulse 95, temperature 98.3 F (36.8 C), height 5\' 6"  (1.676 m), weight 229 lb (103.9 kg), last menstrual period 04/06/2020, SpO2 96 %. Gen:      No acute distress ENT:  mallampati IV, no nasal polyps, mucus membranes moist Lungs:    No increased respiratory effort, symmetric chest wall excursion, clear to auscultation bilaterally, no wheezes or crackles CV:         Regular rate and rhythm; no murmurs, rubs, or gallops.  No pedal edema Abd:      + bowel sounds; soft, non-tender; no distension MSK: no acute synovitis of DIP or PIP joints, no mechanics hands.  Skin:      Warm and dry; no rashes Neuro: normal speech, no focal facial asymmetry Psych: alert and oriented x3, normal mood and affect   Data Reviewed/Medical Decision Making:  Independent interpretation of tests: Imaging:  Review of patient's chest xray images Nov 2022 revealed no acute process, low lung volumes. The patient's images have been independently reviewed by me.    PFTs:no airflow limitation, no significant BD response.  FEV1 = 56% FEV1 post-test change = 6% FEV1/FVC: pretest = 76.6%; post-test = 77.3% No flowsheet data found.  Labs:   Immunization status:  Immunization History  Administered Date(s) Administered   Influenza,inj,Quad PF,6+ Mos 08/20/2020   Tdap 08/20/2020     I reviewed prior external note(s) from ED visit, PCP  I reviewed the result(s) of the labs and imaging as noted above.   I have ordered HSAT  Assessment:  Smoking related lung disease OSA Tobacco use  disorder  Plan/Recommendations: Quit smoking.  Continue prn albuterol. HSAT to eval for OSA - ordered. We will call to schedule this week.   Smoking Cessation Counseling:  1. The patient is an everyday smoker and symptomatic due to the following condition COPD 2. The patient is currently contemplative in quitting smoking. 3. I advised patient to quit smoking. 4. We identified patient specific barriers to  change.  5. I personally spent 5 minutes counseling the patient regarding tobacco use disorder. 6. We discussed management of stress and anxiety to help with smoking cessation, when applicable. 7. We discussed nicotine replacement therapy, Wellbutrin, Chantix as possible options. 8. I advised setting a quit date. 9. Follow?up arranged with our office to continue ongoing discussions. 10.Resources given to patient including quit hotline.    We discussed disease management and progression at length today regarding COPD and    Return to Care: Return in about 3 months (around 02/09/2022).  Lenice Llamas, MD [facility] [place]  CC: Terrilyn Saver, NP

## 2021-11-18 ENCOUNTER — Other Ambulatory Visit: Payer: Self-pay | Admitting: Medical-Surgical

## 2021-11-19 ENCOUNTER — Other Ambulatory Visit: Payer: Self-pay

## 2021-12-24 ENCOUNTER — Telehealth: Payer: Self-pay | Admitting: General Practice

## 2021-12-24 NOTE — Telephone Encounter (Signed)
Transition Care Management Unsuccessful Follow-up Telephone Call  Date of discharge and from where:  12/22/21 from [facility]  Attempts:  1st Attempt  Reason for unsuccessful TCM follow-up call:  Voice mail full

## 2021-12-26 NOTE — Telephone Encounter (Signed)
Transition Care Management Unsuccessful Follow-up Telephone Call  Date of discharge and from where:  12/22/21 from [facility]  Attempts:  2nd Attempt  Reason for unsuccessful TCM follow-up call:  Voice mail full

## 2021-12-29 NOTE — Telephone Encounter (Signed)
Transition Care Management Unsuccessful Follow-up Telephone Call  Date of discharge and from where:  12/22/21 from [facility]  Attempts:  3rd Attempt  Reason for unsuccessful TCM follow-up call:  Voice mail full

## 2022-01-23 ENCOUNTER — Other Ambulatory Visit: Payer: Self-pay | Admitting: Medical-Surgical

## 2022-01-26 ENCOUNTER — Telehealth: Payer: Self-pay | Admitting: Medical-Surgical

## 2022-01-26 MED ORDER — LISINOPRIL 20 MG PO TABS: 20.0000 mg | ORAL_TABLET | Freq: Every day | ORAL | 1 refills | Status: DC

## 2022-01-26 MED ORDER — LISINOPRIL 20 MG PO TABS: 20.0000 mg | ORAL_TABLET | Freq: Every day | ORAL | 0 refills | Status: DC

## 2022-01-26 NOTE — Telephone Encounter (Signed)
A refill has been sent. Patient is aware she will need to keep appointment for more refills.

## 2022-01-26 NOTE — Telephone Encounter (Signed)
Patient is scheduled for an appt with Joy on 4/3 but will be out of lisinopril before than. Can a refill be sent to last her until then or will she need to get a sooner appt.

## 2022-02-06 ENCOUNTER — Other Ambulatory Visit: Payer: Self-pay

## 2022-02-06 ENCOUNTER — Ambulatory Visit: Payer: 59

## 2022-02-06 DIAGNOSIS — G4733 Obstructive sleep apnea (adult) (pediatric): Secondary | ICD-10-CM

## 2022-02-11 DIAGNOSIS — G4733 Obstructive sleep apnea (adult) (pediatric): Secondary | ICD-10-CM | POA: Diagnosis not present

## 2022-02-12 ENCOUNTER — Telehealth: Payer: Self-pay | Admitting: Internal Medicine

## 2022-02-12 DIAGNOSIS — G4733 Obstructive sleep apnea (adult) (pediatric): Secondary | ICD-10-CM

## 2022-02-12 NOTE — Telephone Encounter (Signed)
I have called the patient and let her know about the order for CPAP and patient is aware that she will get a call to set up the CPAP. Nothing further needed.  ?

## 2022-02-12 NOTE — Telephone Encounter (Signed)
Patient has moderate OSA. Please prescribe auto CPAP 5-15 with medium face mask, tubing and supplies. Needs follow up with me 30-90 days after receiving machine to review compliance.  ?

## 2022-02-16 ENCOUNTER — Other Ambulatory Visit: Payer: Self-pay | Admitting: Medical-Surgical

## 2022-03-08 NOTE — Progress Notes (Signed)
Virtual Visit via Telephone ?  ?I connected with  Jasmine Mejia  on 03/09/22 by telephone/telehealth and verified that I am speaking with the correct person using two identifiers. ?  ?I discussed the limitations, risks, security and privacy concerns of performing an evaluation and management service by telephone, including the higher likelihood of inaccurate diagnosis and treatment, and the availability of in person appointments.  We also discussed the likely need of an additional face to face encounter for complete and high quality delivery of care.  I also discussed with the patient that there may be a patient responsible charge related to this service. The patient expressed understanding and wishes to proceed. ? ?Provider location is in medical facility. ?Patient location is at their home, different from provider location. ?People involved in care of the patient during this telehealth encounter were myself, my nurse/medical assistant, and my front office/scheduling team member. ? ?CC: HTN f/up ? ?HPI: ?Pleasant 56 year old female presenting today via telephone after several attempts to connect via MyChart video visit were unsuccessful. She is following up on: ? ?HTN- taking Lisinopril 20mg  daily, tolerating well without side effects. Checking BP at home intermittently, readings averaging 134/87 but diastolics can be elevated up to 98. Following a low sodium diet. Not regularly exercising.  ? ?HLD- stopped taking Lipitor because it gave her severe reflux.  ? ?Mood- stopped taking her Effexor about 2 months ago. Not sure why. Having issues with anxiety/depression again and wants to get restarted. Denies SI/HI.  ? ?Rash- has had a rash develop on both of her hands. Thinks it's from the gloves at work. Has tried cortisone cream (OTC) but it hasn't helped much. Requesting a cream of some kind to help.  ? ?Review of Systems: See HPI for pertinent positives and negatives.  ? ?Objective Findings:   ? ?General: Speaking  full sentences, no audible heavy breathing.  Sounds alert and appropriately interactive.   ? ?Impression and Recommendations:   ? ?1. Essential hypertension ?BP appears to be borderline but would like her to come in for a nurse visit in about 2 weeks so we can check it. Plan to check labs at that time.  ?- CBC with Differential/Platelet ?- COMPLETE METABOLIC PANEL WITH GFR ?- lisinopril (ZESTRIL) 20 MG tablet; Take 1 tablet (20 mg total) by mouth daily.  Dispense: 90 tablet; Refill: 1 ? ?2. Hyperlipidemia, mixed ?Discontinue Lipitor. Start Crestor 10mg  daily. Plan to check lipid panel.  ?- Lipid panel ?- rosuvastatin (CRESTOR) 10 MG tablet; Take 1 tablet (10 mg total) by mouth daily.  Dispense: 90 tablet; Refill: 3 ? ?3. Prediabetes ?Checking A1c.  ?- Hemoglobin A1c ? ?4. Anxiety ?Restart Effexor at 75mg  daily for 1 week then add an additional 37.5mg  daily.  ?- venlafaxine XR (EFFEXOR XR) 37.5 MG 24 hr capsule; Take 1 capsule (37.5 mg total) by mouth daily with breakfast.  Dispense: 90 capsule; Refill: 1 ?- venlafaxine XR (EFFEXOR XR) 75 MG 24 hr capsule; Take 1 capsule (75 mg total) by mouth daily with breakfast.  Dispense: 90 capsule; Refill: 1 ? ?5. Recurrent major depressive disorder, in partial remission (HCC) ?See above.  ?- venlafaxine XR (EFFEXOR XR) 37.5 MG 24 hr capsule; Take 1 capsule (37.5 mg total) by mouth daily with breakfast.  Dispense: 90 capsule; Refill: 1 ?- venlafaxine XR (EFFEXOR XR) 75 MG 24 hr capsule; Take 1 capsule (75 mg total) by mouth daily with breakfast.  Dispense: 90 capsule; Refill: 1 ? ?6. Need for shingles vaccine ?Plan  to give Shingrix #1 at her upcoming nurse visit. Will need to schedule her second one in 2-6 months (ok as a nurse visit). ? ?7. Rash of both hands ?Will try triamcinolone cream twice daily. If not improving, will need an in office evaluation.  ?- triamcinolone cream (KENALOG) 0.5 %; Apply 1 application. topically 2 (two) times daily. To affected areas.  Dispense: 30  g; Refill: 3 ? ?I discussed the above assessment and treatment plan with the patient. The patient was provided an opportunity to ask questions and all were answered. The patient agreed with the plan and demonstrated an understanding of the instructions. ?  ?The patient was advised to call back or seek an in-person evaluation if the symptoms worsen or if the condition fails to improve as anticipated. ? ?20 minutes of non-face-to-face time was provided during this encounter. ? ?Return in about 2 weeks (around 03/23/2022) for nurse visit for BP check/Shingrix/ get labs done. ?___________________________________________ ?Christen Butter, DNP, APRN, FNP-BC ?Primary Care and Sports Medicine ?[facility] ? ?

## 2022-03-09 ENCOUNTER — Encounter: Payer: Self-pay | Admitting: Medical-Surgical

## 2022-03-09 ENCOUNTER — Ambulatory Visit (INDEPENDENT_AMBULATORY_CARE_PROVIDER_SITE_OTHER): Payer: 59 | Admitting: Medical-Surgical

## 2022-03-09 DIAGNOSIS — R7303 Prediabetes: Secondary | ICD-10-CM | POA: Diagnosis not present

## 2022-03-09 DIAGNOSIS — Z23 Encounter for immunization: Secondary | ICD-10-CM

## 2022-03-09 DIAGNOSIS — I10 Essential (primary) hypertension: Secondary | ICD-10-CM | POA: Diagnosis not present

## 2022-03-09 DIAGNOSIS — R21 Rash and other nonspecific skin eruption: Secondary | ICD-10-CM

## 2022-03-09 DIAGNOSIS — E782 Mixed hyperlipidemia: Secondary | ICD-10-CM | POA: Diagnosis not present

## 2022-03-09 DIAGNOSIS — F419 Anxiety disorder, unspecified: Secondary | ICD-10-CM

## 2022-03-09 DIAGNOSIS — F3341 Major depressive disorder, recurrent, in partial remission: Secondary | ICD-10-CM

## 2022-03-09 MED ORDER — VENLAFAXINE HCL ER 37.5 MG PO CP24: 37.5000 mg | ORAL_CAPSULE | Freq: Every day | ORAL | 1 refills | Status: DC

## 2022-03-09 MED ORDER — TRIAMCINOLONE ACETONIDE 0.5 % EX CREA: 1.0000 "application " | TOPICAL_CREAM | Freq: Two times a day (BID) | CUTANEOUS | 3 refills | Status: DC

## 2022-03-09 MED ORDER — VENLAFAXINE HCL ER 75 MG PO CP24: 75.0000 mg | ORAL_CAPSULE | Freq: Every day | ORAL | 1 refills | Status: DC

## 2022-03-09 MED ORDER — LISINOPRIL 20 MG PO TABS: 20.0000 mg | ORAL_TABLET | Freq: Every day | ORAL | 1 refills | Status: DC

## 2022-03-09 MED ORDER — ROSUVASTATIN CALCIUM 10 MG PO TABS: 10.0000 mg | ORAL_TABLET | Freq: Every day | ORAL | 3 refills | Status: DC

## 2022-03-24 ENCOUNTER — Ambulatory Visit (INDEPENDENT_AMBULATORY_CARE_PROVIDER_SITE_OTHER): Payer: 59 | Admitting: Medical-Surgical

## 2022-03-24 VITALS — BP 124/79 | HR 84

## 2022-03-24 DIAGNOSIS — I10 Essential (primary) hypertension: Secondary | ICD-10-CM

## 2022-03-24 NOTE — Progress Notes (Signed)
? ?  Established Patient Office Visit ? ?Subjective   ?Patient ID: Jasmine Mejia, female    DOB: 1966/01/11  Age: 56 y.o. MRN: [id number] ? ?Chief Complaint  ?Patient presents with  ? Hypertension  ? ? ?HPI ? ?Itati is here for blood pressure check. Denies chest pain, shortness of breath or dizziness. ? ?She will not be able to do fasting labs today. ? ?She states she will think about getting a shingles vaccine. ? ?ROS ? ?  ?Objective:  ?  ? ?BP 124/79   Pulse 84   LMP 04/06/2020   SpO2 97%  ? ? ?Physical Exam ? ? ?No results found for any visits on 03/24/22. ? ? ? ?The 10-year ASCVD risk score (Arnett DK, et al., 2019) is: 5.7% ? ?  ?Assessment & Plan:  ?Hypertension - Blood pressure within normal limits. Patient advised to continue current medications as directed. Follow up in 3 months for HTN with Joy.  ? ?Problem List Items Addressed This Visit   ? ? Essential hypertension - Primary  ? ? ?Return in about 3 months (around 06/23/2022) for blood pressure check. .  ? ? ?Esmond Harps, CMA ? ?

## 2022-03-24 NOTE — Progress Notes (Signed)
Agree with documentation as above.  ? ?___________________________________________ ?Malone Vanblarcom L. Carlise Stofer, DNP, APRN, FNP-BC ?Primary Care and Sports Medicine ?[facility] ? ?

## 2022-04-15 ENCOUNTER — Encounter: Payer: Self-pay | Admitting: Internal Medicine

## 2022-04-17 ENCOUNTER — Ambulatory Visit: Payer: 59 | Admitting: Internal Medicine

## 2022-04-22 ENCOUNTER — Telehealth: Payer: Self-pay | Admitting: General Practice

## 2022-04-22 NOTE — Telephone Encounter (Signed)
Transition Care Management Follow-up Telephone Call ?Date of discharge and from where: 04/21/22 from [facility] ?How have you been since you were released from the hospital? Still having some knee pain. ?Any questions or concerns? No ? ?Items Reviewed: ?Did the pt receive and understand the discharge instructions provided? Yes  ?Medications obtained and verified? No  ?Other? No  ?Any new allergies since your discharge? No  ?Dietary orders reviewed? No ?Do you have support at home? Yes  ? ?Home Care and Equipment/Supplies: ?Were home health services ordered? no ? ?Functional Questionnaire: (I = Independent and D = Dependent) ?ADLs: I ? ?Bathing/Dressing- I ? ?Meal Prep- I ? ?Eating- I ? ?Maintaining continence- I ? ?Transferring/Ambulation- I ? ?Managing Meds- I ? ?Follow up appointments reviewed: ? ?PCP Hospital f/u appt confirmed? Yes  Scheduled to see Christen Butter, NP on 05/01/22 @ 1600. ?Specialist Hospital f/u appt confirmed? No   ?Are transportation arrangements needed? No  ?If their condition worsens, is the pt aware to call PCP or go to the Emergency Dept.? Yes ?Was the patient provided with contact information for the PCP's office or ED? Yes ?Was to pt encouraged to call back with questions or concerns? Yes  ?

## 2022-04-30 ENCOUNTER — Ambulatory Visit: Payer: 59 | Admitting: Internal Medicine

## 2022-05-01 ENCOUNTER — Ambulatory Visit (INDEPENDENT_AMBULATORY_CARE_PROVIDER_SITE_OTHER): Payer: 59 | Admitting: Medical-Surgical

## 2022-05-01 ENCOUNTER — Encounter: Payer: Self-pay | Admitting: Medical-Surgical

## 2022-05-01 VITALS — BP 120/80 | HR 85 | Resp 20 | Ht 66.0 in | Wt 233.1 lb

## 2022-05-01 DIAGNOSIS — Z09 Encounter for follow-up examination after completed treatment for conditions other than malignant neoplasm: Secondary | ICD-10-CM | POA: Diagnosis not present

## 2022-05-01 MED ORDER — CYCLOBENZAPRINE HCL 10 MG PO TABS: 10.0000 mg | ORAL_TABLET | Freq: Three times a day (TID) | ORAL | 0 refills | Status: DC | PRN

## 2022-05-01 NOTE — Assessment & Plan Note (Addendum)
Reviewed available hospital records.  Unfortunately, she is already on methadone so unable to further manage her pain today.  Recommend contacting her prescribing provider for gabapentin to ask them about dosage adjustments.  She could potentially go up to 900 mg 3 times daily if they are okay with this.  Adding Flexeril 10 mg 3 times daily as needed as this may help some with her back pain as well as her knee pain.  Okay to use ibuprofen 600 mg every 6-8 hours as needed.  Continue conservative measures.  Discussed at length that she would benefit from weight loss and regular intentional activity.  Recommend she follow-up with her orthopedic provider or Dr. Karie Schwalbe here in our office regarding potential interventions for her left knee injury.

## 2022-05-01 NOTE — Progress Notes (Signed)
Established Patient Office Visit  Subjective   Patient ID: Jasmine Mejia, female   DOB: 13-Nov-1966 Age: 56 y.o. MRN: [id number]   Chief Complaint  Patient presents with   Hospitalization Follow-up   Knee Pain    HPI Pleasant 56 year old female presenting today for hospital discharge follow-up.  She was evaluated on 5/18 for left leg pain at the emergency room.  The completed an ultrasound which was negative for DVT as well as an x-ray that was normal.  On presentation, the ED suspected she may have had a hamstring and advised her to do home exercises for stretching and follow-up with Ortho as needed.  Today, she presents with reports that she is still having significant knee pain along with some fluid on the left knee.  Reports that she has a meniscal tear in the knee which is causing her significant difficulty with standing, walking, and other daily activities.  She is currently taking methadone and gabapentin.  These are not controlling her pain and she reports ibuprofen was not helpful.  She has not gone to Ortho yet and is considering just coming to see Dr. Darene Lamer here in our office for further evaluation.  Also notes that she is having some left-sided back pain after moving some heavy objects at work.  Is requesting to have a few doses of Flexeril sent to the pharmacy.  Review of Systems  Constitutional:  Negative for chills, fever and malaise/fatigue.  Respiratory:  Negative for cough, shortness of breath and wheezing.   Cardiovascular:  Negative for chest pain, palpitations and leg swelling.  Musculoskeletal:  Positive for back pain and joint pain (Left knee).  Neurological:  Negative for dizziness and headaches.  Psychiatric/Behavioral:  Negative for depression and suicidal ideas. The patient is not nervous/anxious and does not have insomnia.      Objective:    Vitals:   05/01/22 1624  BP: 120/80  Pulse: 85  Resp: 20  Height: 5\' 6"  (1.676 m)  Weight: 233 lb 1.9 oz (105.7 kg)   SpO2: 98%  BMI (Calculated): 37.64     Physical Exam Vitals and nursing note reviewed.  Constitutional:      General: She is not in acute distress.    Appearance: Normal appearance. She is obese. She is not ill-appearing.  HENT:     Head: Normocephalic and atraumatic.  Cardiovascular:     Rate and Rhythm: Normal rate and regular rhythm.     Pulses: Normal pulses.     Heart sounds: Normal heart sounds.  Pulmonary:     Effort: Pulmonary effort is normal. No respiratory distress.     Breath sounds: Normal breath sounds. No wheezing, rhonchi or rales.  Musculoskeletal:        General: Tenderness (Left knee, left posterior distal thigh) and signs of injury present.  Skin:    General: Skin is warm and dry.  Neurological:     Mental Status: She is alert and oriented to person, place, and time.  Psychiatric:        Mood and Affect: Mood normal.        Behavior: Behavior normal.        Thought Content: Thought content normal.        Judgment: Judgment normal.   No results found for this or any previous visit (from the past 24 hour(s)).     The 10-year ASCVD risk score (Arnett DK, et al., 2019) is: 5.3%   Values used to calculate the score:  Age: 3 years     Sex: Female     Is Non-Hispanic African American: No     Diabetic: No     Tobacco smoker: Yes     Systolic Blood Pressure: 123456 mmHg     Is BP treated: Yes     HDL Cholesterol: 48 mg/dL     Total Cholesterol: 186 mg/dL   Assessment & Plan:   Hospital discharge follow-up Reviewed available hospital records.  Unfortunately, she is already on methadone so unable to further manage her pain today.  Recommend contacting her prescribing provider for gabapentin to ask them about dosage adjustments.  She could potentially go up to 900 mg 3 times daily if they are okay with this.  Adding Flexeril 10 mg 3 times daily as needed as this may help some with her back pain as well as her knee pain.  Okay to use ibuprofen 600 mg every  6-8 hours as needed.  Continue conservative measures.  Discussed at length that she would benefit from weight loss and regular intentional activity.  Recommend she follow-up with her orthopedic provider or Dr. Darene Lamer here in our office regarding potential interventions for her left knee injury.   Return in about 3 months (around 08/01/2022) for chronic disease follow up.  ___________________________________________ Clearnce Sorrel, DNP, APRN, FNP-BC [facility] [place]

## 2022-05-08 ENCOUNTER — Encounter: Payer: Self-pay | Admitting: Internal Medicine

## 2022-05-08 ENCOUNTER — Ambulatory Visit (INDEPENDENT_AMBULATORY_CARE_PROVIDER_SITE_OTHER): Payer: 59 | Admitting: Internal Medicine

## 2022-05-08 VITALS — BP 122/82 | HR 78 | Temp 98.3°F | Ht 66.0 in | Wt 226.0 lb

## 2022-05-08 DIAGNOSIS — F17210 Nicotine dependence, cigarettes, uncomplicated: Secondary | ICD-10-CM | POA: Diagnosis not present

## 2022-05-08 DIAGNOSIS — G4733 Obstructive sleep apnea (adult) (pediatric): Secondary | ICD-10-CM | POA: Diagnosis not present

## 2022-05-08 DIAGNOSIS — J449 Chronic obstructive pulmonary disease, unspecified: Secondary | ICD-10-CM | POA: Diagnosis not present

## 2022-05-08 DIAGNOSIS — Z9989 Dependence on other enabling machines and devices: Secondary | ICD-10-CM | POA: Diagnosis not present

## 2022-05-08 MED ORDER — STIOLTO RESPIMAT 2.5-2.5 MCG/ACT IN AERS: 2.0000 | INHALATION_SPRAY | Freq: Every day | RESPIRATORY_TRACT | 5 refills | Status: AC

## 2022-05-08 MED ORDER — ALBUTEROL SULFATE HFA 108 (90 BASE) MCG/ACT IN AERS: 2.0000 | INHALATION_SPRAY | Freq: Four times a day (QID) | RESPIRATORY_TRACT | 5 refills | Status: AC | PRN

## 2022-05-08 NOTE — Progress Notes (Signed)
The patient has been prescribed the inhaler stiolto. Inhaler technique was demonstrated to patient. The patient subsequently demonstrated correct technique.  

## 2022-05-08 NOTE — Progress Notes (Signed)
ol              Jasmine Mejia    [id number]    [birth year]  Primary Care Physician:Jessup, Ander Slade, NP Date of Appointment: 05/08/2022 Established Patient Visit  Chief complaint:   Chief Complaint  Patient presents with   Follow-up    She is doing a follow up for her CPAP, She has some yellow sptutum x 3 days.      HPI: Jasmine Mejia is a 56 y.o. woman with OSA on CPAP and tobacco use disorder  Interval Updates: Here for follow up after getting CPAP and using it. Doing well overall.   CPAP download reviewed and she has 93% adherence, but about half the days she uses less than 4 hours - she thinks it falls off in her sleep. Minimal leak, AHI 1.2 during use.   She does perceive a benefit and feels more awake, doesn't fall asleep during the day  Having to use albuterol 2-3 times/day. Worsening cough and shortness of breath  Has cut back to 1 ppd. Has patches but hasn't started yet - wanted to see what I thought  I have reviewed the patient's family social and past medical history and updated as appropriate.   Past Medical History:  Diagnosis Date   Anxiety    Arthritis    Asthma    Depression    Hypertension    Pneumonia 2016   Pre-diabetes     Past Surgical History:  Procedure Laterality Date   APPENDECTOMY     CHOLECYSTECTOMY     CHONDROPLASTY Right 10/09/2020   Procedure: CHONDROPLASTY;  Surgeon: Bjorn Pippin, MD;  Location: [facility];  Service: Orthopedics;  Laterality: Right;   KNEE ARTHROSCOPY WITH LATERAL MENISECTOMY Right 10/09/2020   Procedure: KNEE ARTHROSCOPY WITH LATERAL MENISECTOMY;  Surgeon: Bjorn Pippin, MD;  Location: [facility];  Service: Orthopedics;  Laterality: Right;   KNEE ARTHROSCOPY WITH MEDIAL MENISECTOMY Right 10/09/2020   Procedure: KNEE ARTHROSCOPY WITH MEDIAL MENISECTOMY;  Surgeon: Bjorn Pippin, MD;  Location: [facility];  Service: Orthopedics;  Laterality: Right;   KNEE ARTHROSCOPY WITH  SUBCHONDROPLASTY Right 10/09/2020   Procedure: KNEE ARTHROSCOPY WITH SUBCHONDROPLASTY;  Surgeon: Bjorn Pippin, MD;  Location:  SUR[facility]ERY [facility];  Service: Orthopedics;  Laterality: Right;   TOTAL KNEE ARTHROPLASTY Right 05/30/2021   Procedure: TOTAL KNEE ARTHROPLASTY;  Surgeon: Jodi Geralds, MD;  Location: [facility];  Service: Orthopedics;  Laterality: Right;   TUBAL LIGATION      Family History  Problem Relation Age of Onset   Hypertension Mother    Heart attack Mother    Diabetes Mother    Hypertension Sister    Heart attack Sister    Diabetes Sister    Hypertension Brother    Heart attack Brother    Diabetes Brother    Asthma Son    Colitis Neg Hx    Esophageal cancer Neg Hx    Stomach cancer Neg Hx    Rectal cancer Neg Hx     Social History   Occupational History   Not on file  Tobacco Use   Smoking status: Every Day    Packs/day: 1.00    Years: 37.00    Pack years: 37.00    Types: Cigarettes    Last attempt to quit: 05/21/2021    Years since quitting: 0.9   Smokeless tobacco: Never   Tobacco comments:    1 ppd  Vaping Use  Vaping Use: Some days  Substance and Sexual Activity   Alcohol use: Not Currently   Drug use: Never   Sexual activity: Yes    Partners: Male    Birth control/protection: Post-menopausal     Physical Exam: Blood pressure 122/82, pulse 78, temperature 98.3 F (36.8 C), temperature source Oral, height 5\' 6"  (1.676 m), weight 226 lb (102.5 kg), last menstrual period 04/06/2020, SpO2 97 %.  Gen:      No acute distress ENT:  no nasal polyps, mucus membranes moist, mallampati IV Lungs:    No increased respiratory effort, symmetric chest wall excursion, clear to auscultation bilaterally, no wheezes or crackles CV:         Regular rate and rhythm; no murmurs, rubs, or gallops.  No pedal edema   Data Reviewed: Imaging: I have personally reviewed the chest xray Nov 2022 - no acute process, bilateral chronic intersitial  changes  PFTs:      View : No data to display.         I have personally reviewed the patient's PFTs and no airflow limitaiton  Labs: Lab Results  Component Value Date   WBC 11.1 (H) 05/31/2021   HGB 11.9 (L) 05/31/2021   HCT 36.9 05/31/2021   MCV 92.7 05/31/2021   PLT 325 05/31/2021   Lab Results  Component Value Date   NA 145 05/09/2021   K 4.0 05/09/2021   CL 105 05/09/2021   CO2 31 05/09/2021     Immunization status: Immunization History  Administered Date(s) Administered   Influenza,inj,Quad PF,6+ Mos 08/20/2020   Tdap 08/20/2020    External Records Personally Reviewed:   Assessment:  OSA on CPAP Gold stage 0 COPD Tobacco use disorder  Plan/Recommendations: Start stiolto maintenance inhaler continue albuterol prn Continue cpap- doing great so far Patches for smoking cessation - discussed going down from 21 to 14 to 7 mcg.    Smoking Cessation Counseling:  1. The patient is an everyday smoker and symptomatic due to the following condition copd 2. The patient is currently contemplativein quitting smoking. 3. I advised patient to quit smoking. 4. We identified patient specific barriers to change.  5. I personally spent 3 minutes counseling the patient regarding tobacco use disorder. 6. We discussed management of stress and anxiety to help with smoking cessation, when applicable. 7. We discussed nicotine replacement therapy, Wellbutrin, Chantix as possible options. 8. I advised setting a quit date. 9. Follow?up arranged with our office to continue ongoing discussions. 10.Resources given to patient including quit hotline.     Return to Care: Return in about 3 months (around 08/08/2022).   10/08/2022, MD Pulmonary and Critical Care Medicine [facility] [phone]

## 2022-05-08 NOTE — Patient Instructions (Addendum)
Please schedule follow up scheduled with myself in 3 months.  If my schedule is not open yet, we will contact you with a reminder closer to that time. Please call [phone] if you haven't heard from [place] a month before.   Start stiolto inhaler 2 puffs once a day.   Continue albuterol.  You are doing a great job with cpap! Keep wearing and keep up the good work.  What are the benefits of quitting smoking? Quitting smoking can lower your chances of getting or dying from heart disease, lung disease, kidney failure, infection, or cancer. It can also lower your chances of getting osteoporosis, a condition that makes your bones weak. Plus, quitting smoking can help your skin look younger and reduce the chances that you will have problems with sex.  Quitting smoking will improve your health no matter how old you are, and no matter how long or how much you have smoked.  What should I do if I want to quit smoking? The letters in the word "START" can help you remember the steps to take: S = Set a quit date. T = Tell family, friends, and the people around you that you plan to quit. A = Anticipate or plan ahead for the tough times you'll face while quitting. R = Remove cigarettes and other tobacco products from your home, car, and work. T = Talk to your doctor about getting help to quit.  How can my doctor or nurse help? Your doctor or nurse can give you advice on the best way to quit. He or she can also put you in touch with counselors or other people you can call for support. Plus, your doctor or nurse can give you medicines to: ?Reduce your craving for cigarettes ?Reduce the unpleasant symptoms that happen when you stop smoking (called "withdrawal symptoms"). You can also get help from a free phone line (1-800-QUIT-NOW) or go online to [employer].  What are the symptoms of withdrawal? The symptoms include: ?Trouble sleeping ?Being irritable, anxious or restless ?Getting frustrated or  angry ?Having trouble thinking clearly  Some people who stop smoking become temporarily depressed. Some people need treatment for depression, such as counseling or antidepressant medicines. Depressed people might: ?No longer enjoy or care about doing the things they used to like to do ?Feel sad, down, hopeless, nervous, or cranky most of the day, almost every day ?Lose or gain weight ?Sleep too much or too little ?Feel tired or like they have no energy ?Feel guilty or like they are worth nothing ?Forget things or feel confused ?Move and speak more slowly than usual ?Act restless or have trouble staying still ?Think about death or suicide  If you think you might be depressed, see your doctor or nurse. Only someone trained in mental health can tell for sure if you are depressed. If you ever feel like you might hurt yourself, go straight to the nearest emergency department. Or you can call for an ambulance (in the [place] and [place], [place] 9-1-1) or call your doctor or nurse right away and tell them it is an emergency. You can also reach the [place] National Suicide Prevention Lifeline at (830)296-6620 or http://walker-sanchez.info/.  How do medicines help you stop smoking? Different medicines work in different ways: ?Nicotine replacement therapy eases withdrawal and reduces your body's craving for nicotine, the main drug found in cigarettes. There are different forms of nicotine replacement, including skin patches, lozenges, gum, nasal sprays, and "puffers" or inhalers. Many can be bought  without a prescription, while others might require one. ?Bupropion is a prescription medicine that reduces your desire to smoke. This medicine is sold under the brand names Zyban and Wellbutrin. It is also available in a generic version, which is cheaper than brand name medicines. ?Varenicline (brand names: Chantix, Champix) is a prescription medicine that reduces withdrawal symptoms and cigarette cravings. If you  think you'd like to take varenicline and you have a history of depression, anxiety, or heart disease, discuss this with your doctor or nurse before taking the medicine. Varenicline can also increase the effects of alcohol in some people. It's a good idea to limit drinking while you're taking it, at least until you know how it affects you.  How does counseling work? Counseling can happen during formal office visits or just over the phone. A counselor can help you: ?Figure out what triggers your smoking and what to do instead ?Overcome cravings ?Figure out what went wrong when you tried to quit before  What works best? Studies show that people have the best luck at quitting if they take medicines to help them quit and work with a Social worker. It might also be helpful to combine nicotine replacement with one of the prescription medicines that help people quit. In some cases, it might even make sense to take bupropion and varenicline together.  What about e-cigarettes? Sometimes people wonder if using electronic cigarettes, or "e-cigarettes," might help them quit smoking. Using e-cigarettes is also called "vaping." Doctors do not recommend e-cigarettes in place of medicines and counseling. That's because e-cigarettes still contain nicotine as well as other substances that might be harmful. It's not clear how they can affect a person's health in the long term.  Will I gain weight if I quit? Yes, you might gain a few pounds. But quitting smoking will have a much more positive effect on your health than weighing a few pounds more. Plus, you can help prevent some weight gain by being more active and eating less. Taking the medicine bupropion might help control weight gain.   What else can I do to improve my chances of quitting? You can: ?Start exercising. ?Stay away from smokers and places that you associate with smoking. If people close to you smoke, ask them to quit with you. ?Keep gum, hard candy, or  something to put in your mouth handy. If you get a craving for a cigarette, try one of these instead. ?Don't give up, even if you start smoking again. It takes most people a few tries before they succeed.  What if I am pregnant and I smoke? If you are pregnant, it's really important for the health of your baby that you quit. Ask your doctor what options you have, and what is safest for your baby

## 2022-05-13 ENCOUNTER — Other Ambulatory Visit: Payer: 59

## 2022-05-15 ENCOUNTER — Ambulatory Visit
Admission: RE | Admit: 2022-05-15 | Discharge: 2022-05-15 | Disposition: A | Discharge status: Home / Self Care | Payer: 59 | Source: Ambulatory Visit | Attending: Medical-Surgical | Admitting: Medical-Surgical

## 2022-05-15 ENCOUNTER — Other Ambulatory Visit: Payer: Self-pay | Admitting: Medical-Surgical

## 2022-05-15 DIAGNOSIS — N63 Unspecified lump in unspecified breast: Secondary | ICD-10-CM

## 2022-05-15 IMAGING — MG MM DIGITAL DIAGNOSTIC UNILAT*R* W/ TOMO W/ CAD
4 series · 5 of 12 positions shown · non-contrast
Comparison: Previous exam(s).

CLINICAL DATA: Short-term interval follow-up of likely benign
masses in the RIGHT breast.

EXAM:
DIGITAL DIAGNOSTIC UNILATERAL RIGHT MAMMOGRAM WITH TOMOSYNTHESIS AND
CAD; ULTRASOUND RIGHT BREAST LIMITED
TECHNIQUE: Right digital diagnostic mammography and breast tomosynthesis was
performed. The images were evaluated with computer-aided detection.;
Targeted ultrasound examination of the right breast was performed.

[R MLO synth-2D]
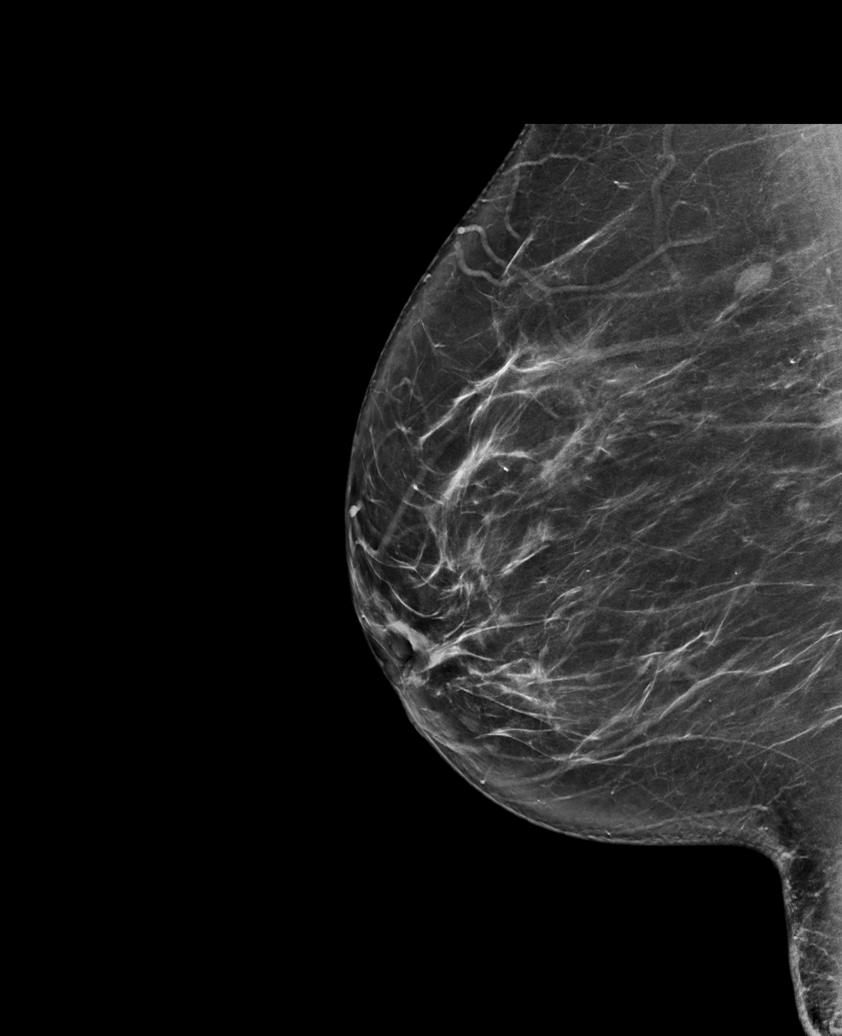

[R CC synth-2D]
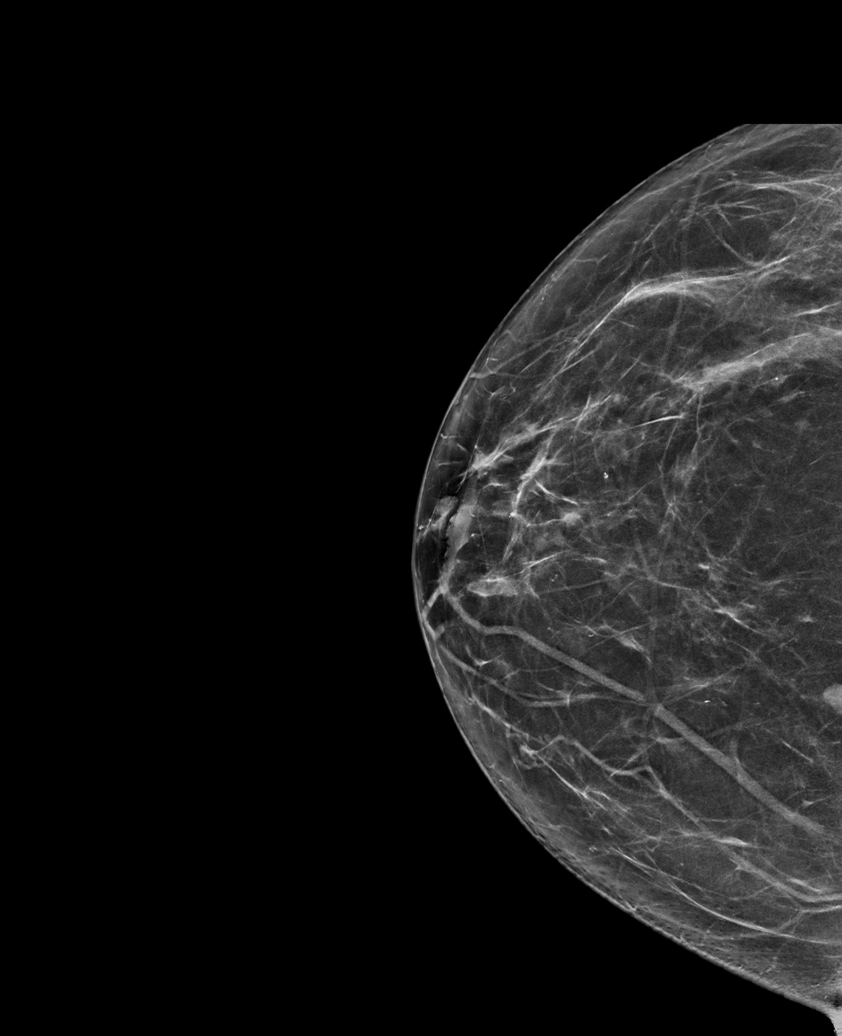

[R CC tomo · 2 of 75 frames shown]
[frame 25/75]
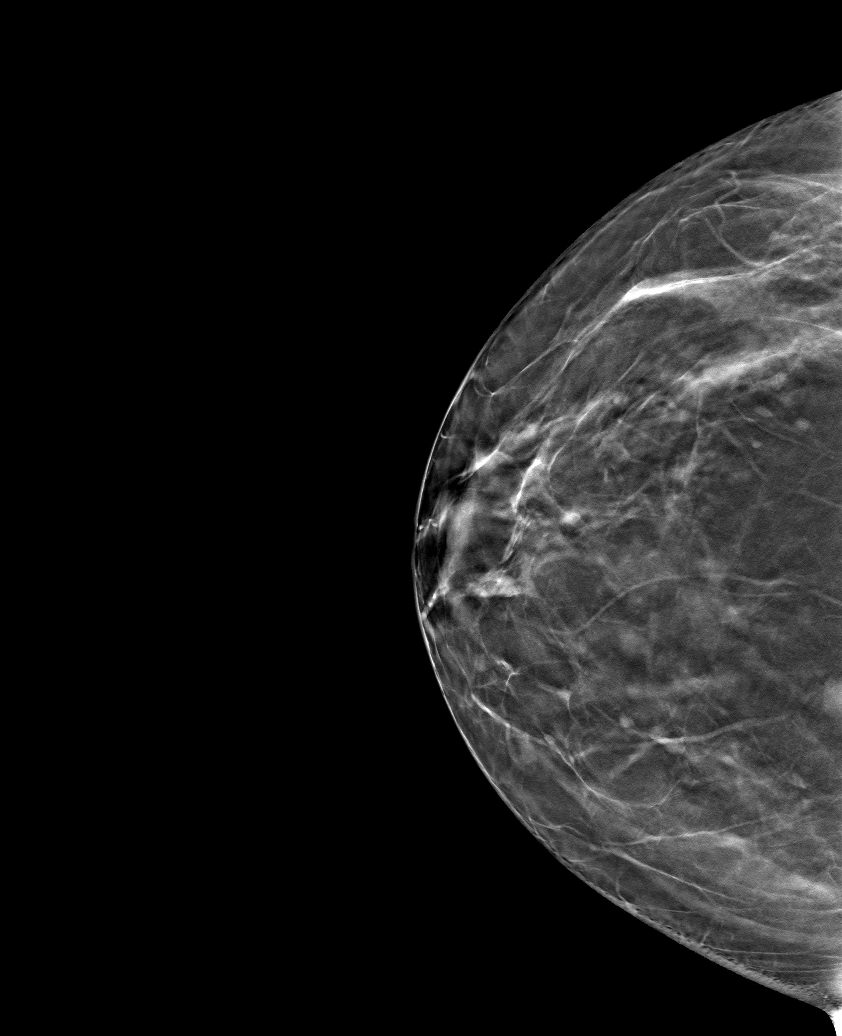
[frame 38/75]
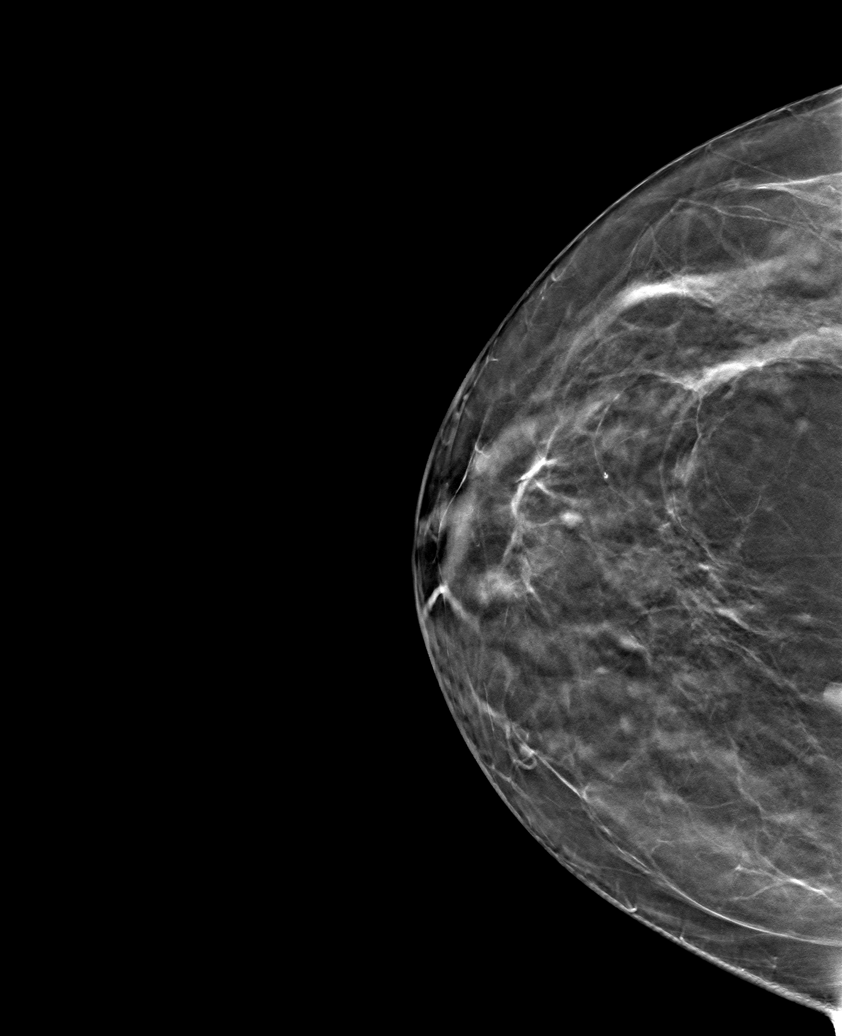

[R MLO tomo · tomo slice 43/86.0]
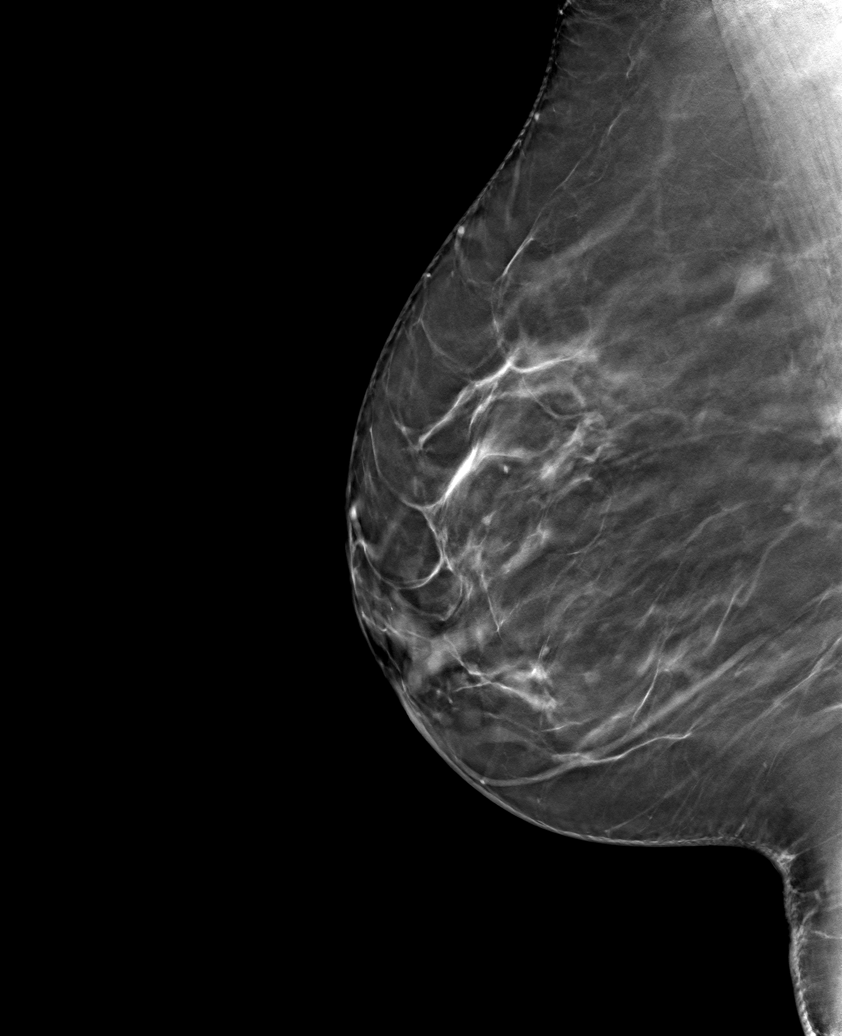

[5 of 12 positions shown; findings below may reference images not displayed]

ACR Breast Density Category b: There are scattered areas of
fibroglandular density.
FINDINGS: Full field CC and MLO views were obtained.

The circumscribed oval isodense mass in the UPPER INNER QUADRANT at
posterior depth, initially identified on the baseline mammogram in
[GM], is unchanged, measuring just over 1 cm in size. There is no
associated architectural distortion or suspicious calcifications.

The partially obscured isodense mass in the lower subareolar
location is unchanged. There is no associated architectural
distortion or suspicious calcifications.

No new or suspicious findings elsewhere.

Targeted ultrasound is performed, demonstrating the oval parallel
hypoechoic mass with gentle lobulations at the 1 o'clock position 10
cm from the nipple measuring approximately 1.0 x 0.4 x 0.5 cm
(previously 1.0 x 0.4 x 0.5 cm on [DATE], 1.0 x 0.5 x 0.5 cm on
[DATE], and 1.1 x 0.4 x 0.5 cm [DATE]), demonstrating mixed
posterior characteristics.

The oval circumscribed parallel heterogeneous though predominantly
hypoechoic mass at the subareolar location currently measures 0.9 x
0.4 x 0.6 cm (previously 0.6 x 0.3 x 0.5 cm at initial diagnosis on
[DATE], likely underestimated in the longest axis which is
cm by my measurement), demonstrating mixed posterior characteristics
and no internal power Doppler. The mass is more to the inner
subareolar location rather than the lower subareolar location on the
current ultrasound, but is clearly the same mass.
IMPRESSION: 1. Stable likely benign 0.9 cm mass in the lower/outer subareolar
RIGHT breast.
2. Stable 1.0 cm mass in the UPPER INNER QUADRANT of the RIGHT
breast at 1 o'clock 10 cm from the nipple since [GM], confirming
benignity, likely a fibroadenoma.

RECOMMENDATION:
BILATERAL diagnostic mammography and RIGHT breast ultrasound in 6
months (ultrasound to confirm stability of the subareolar mass).

I have discussed the findings and recommendations with the patient.
If applicable, a reminder letter will be sent to the patient
regarding the next appointment.

BI-RADS CATEGORY  3: Probably benign.

## 2022-05-15 IMAGING — US US BREAST*R* LIMITED INC AXILLA
1 series · 12 of 12 positions shown · non-contrast
Comparison: Previous exam(s).

CLINICAL DATA: Short-term interval follow-up of likely benign
masses in the RIGHT breast.

EXAM:
DIGITAL DIAGNOSTIC UNILATERAL RIGHT MAMMOGRAM WITH TOMOSYNTHESIS AND
CAD; ULTRASOUND RIGHT BREAST LIMITED
TECHNIQUE: Right digital diagnostic mammography and breast tomosynthesis was
performed. The images were evaluated with computer-aided detection.;
Targeted ultrasound examination of the right breast was performed.

[Series 1: us breast*right* limited inc axilla · 0.06mm/px · 12 of 12 slices shown]
[im 1/12]
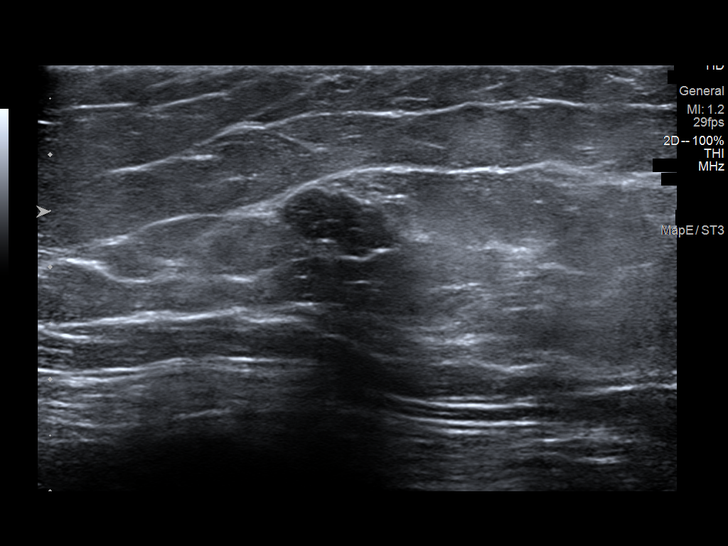
[im 2/12]
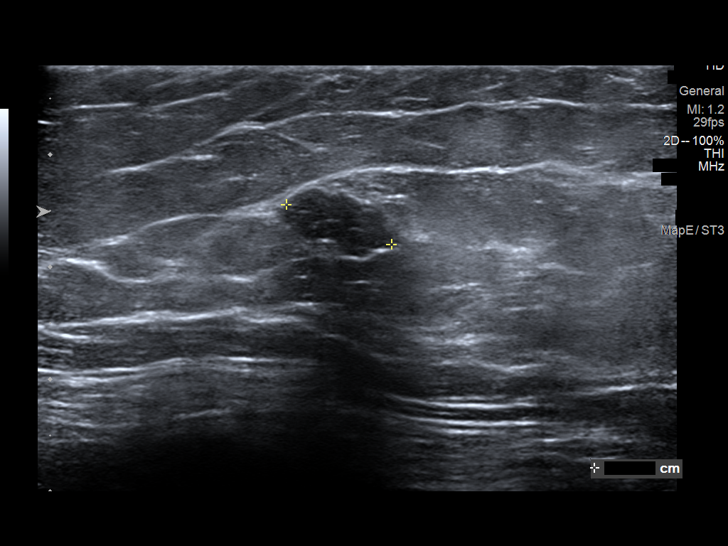
[im 3/12]
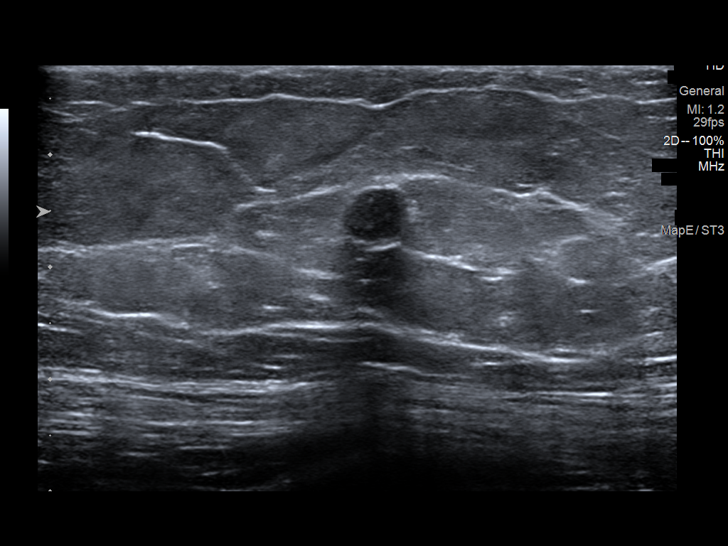
[im 4/12]
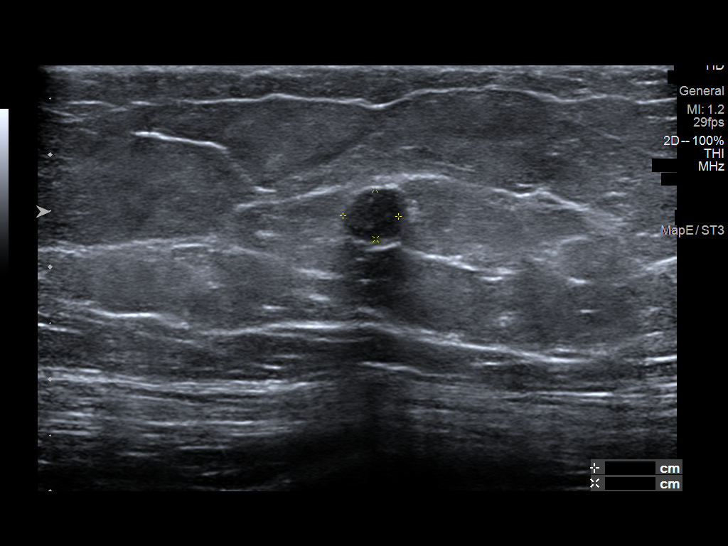
[im 5/12]
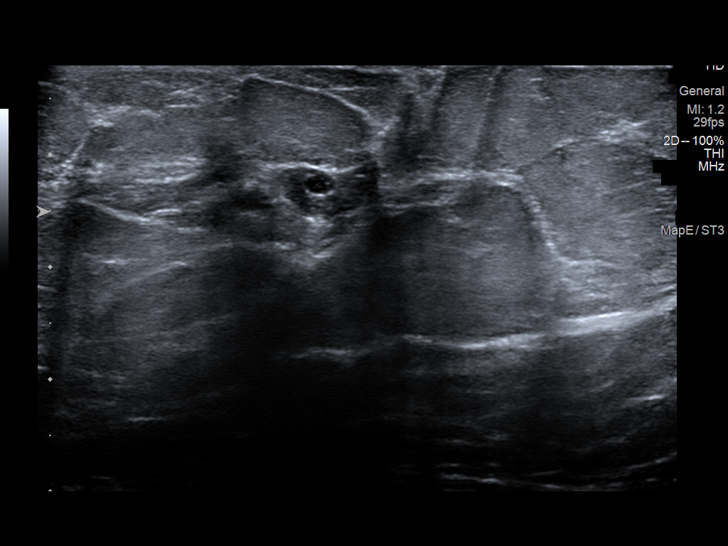
[im 6/12]
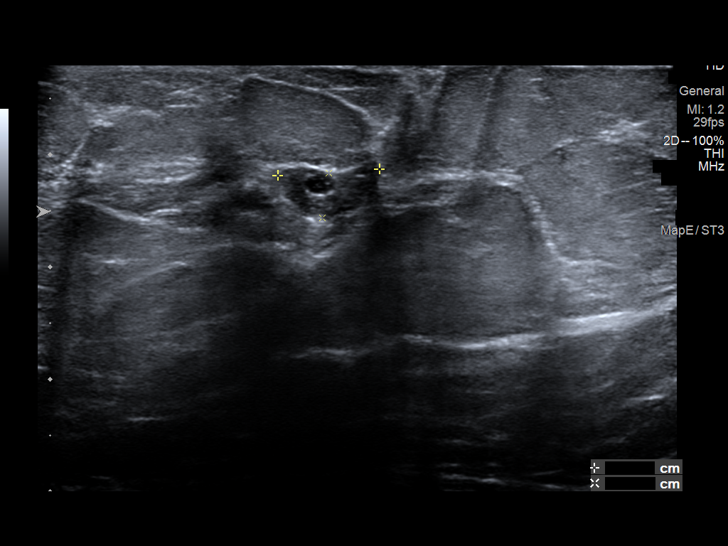
[im 7/12]
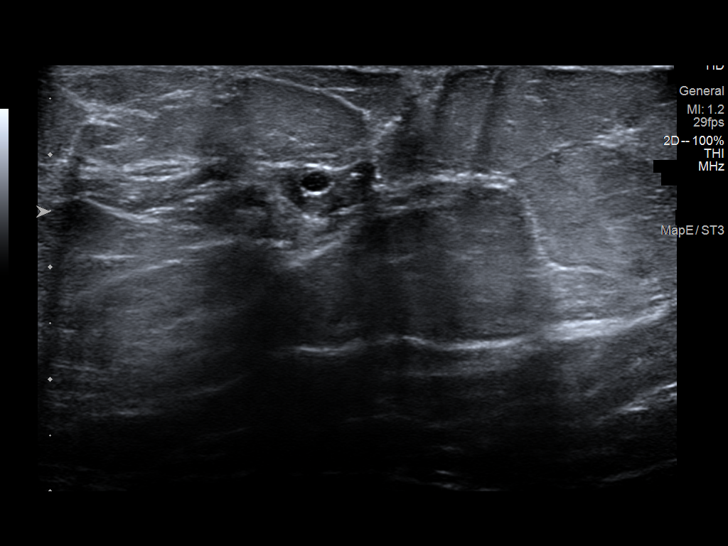
[im 8/12]
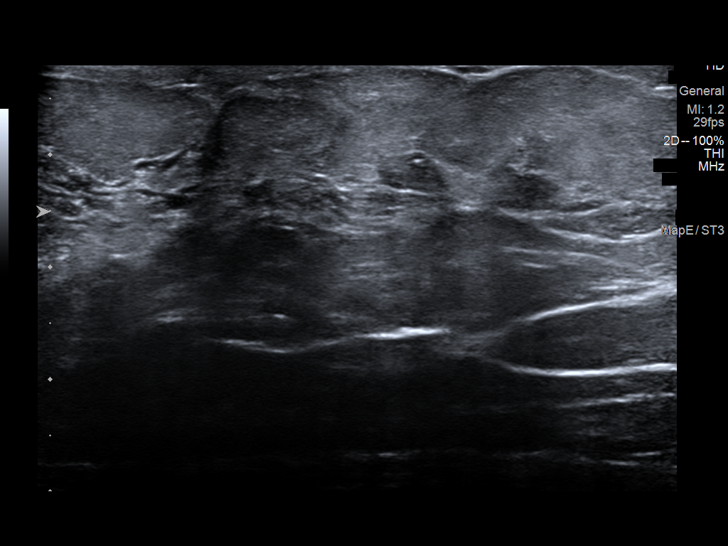
[im 9/12]
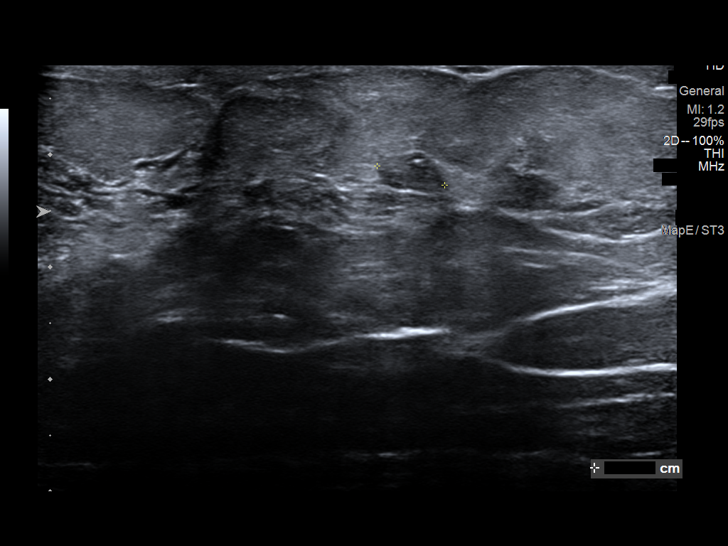
[im 10/12]
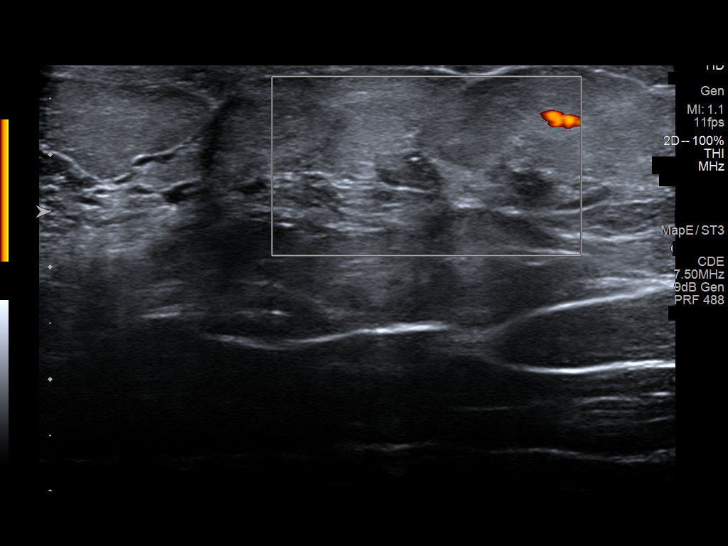
[im 11/12]
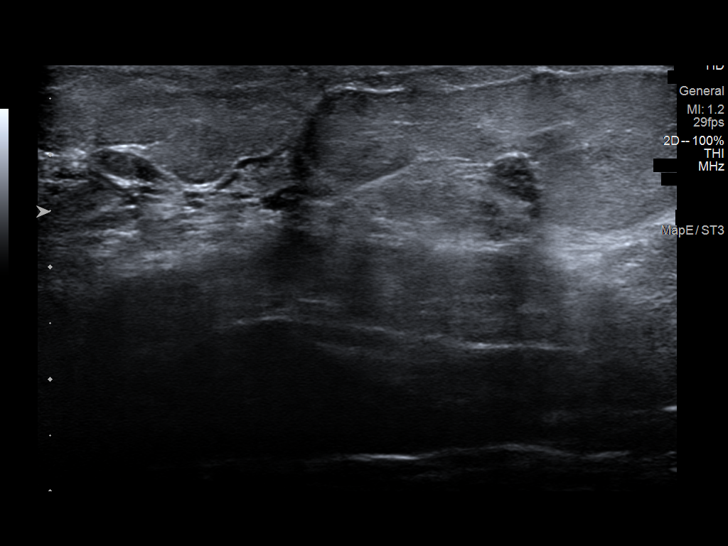
[im 12/12]
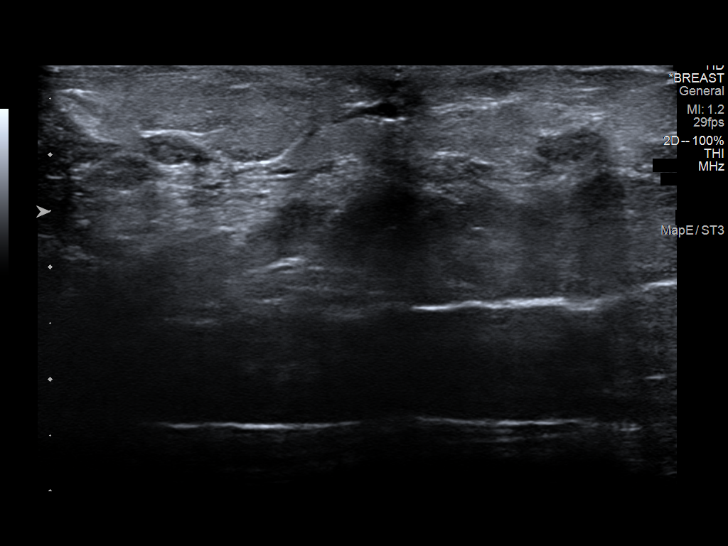

[12 of 12 positions shown; findings below may reference images not displayed]

ACR Breast Density Category b: There are scattered areas of
fibroglandular density.
FINDINGS: Full field CC and MLO views were obtained.

The circumscribed oval isodense mass in the UPPER INNER QUADRANT at
posterior depth, initially identified on the baseline mammogram in
[GM], is unchanged, measuring just over 1 cm in size. There is no
associated architectural distortion or suspicious calcifications.

The partially obscured isodense mass in the lower subareolar
location is unchanged. There is no associated architectural
distortion or suspicious calcifications.

No new or suspicious findings elsewhere.

Targeted ultrasound is performed, demonstrating the oval parallel
hypoechoic mass with gentle lobulations at the 1 o'clock position 10
cm from the nipple measuring approximately 1.0 x 0.4 x 0.5 cm
(previously 1.0 x 0.4 x 0.5 cm on [DATE], 1.0 x 0.5 x 0.5 cm on
[DATE], and 1.1 x 0.4 x 0.5 cm [DATE]), demonstrating mixed
posterior characteristics.

The oval circumscribed parallel heterogeneous though predominantly
hypoechoic mass at the subareolar location currently measures 0.9 x
0.4 x 0.6 cm (previously 0.6 x 0.3 x 0.5 cm at initial diagnosis on
[DATE], likely underestimated in the longest axis which is
cm by my measurement), demonstrating mixed posterior characteristics
and no internal power Doppler. The mass is more to the inner
subareolar location rather than the lower subareolar location on the
current ultrasound, but is clearly the same mass.
IMPRESSION: 1. Stable likely benign 0.9 cm mass in the lower/outer subareolar
RIGHT breast.
2. Stable 1.0 cm mass in the UPPER INNER QUADRANT of the RIGHT
breast at 1 o'clock 10 cm from the nipple since [GM], confirming
benignity, likely a fibroadenoma.

RECOMMENDATION:
BILATERAL diagnostic mammography and RIGHT breast ultrasound in 6
months (ultrasound to confirm stability of the subareolar mass).

I have discussed the findings and recommendations with the patient.
If applicable, a reminder letter will be sent to the patient
regarding the next appointment.

BI-RADS CATEGORY  3: Probably benign.

## 2022-06-23 ENCOUNTER — Ambulatory Visit (INDEPENDENT_AMBULATORY_CARE_PROVIDER_SITE_OTHER): Payer: Self-pay | Admitting: Medical-Surgical

## 2022-06-23 DIAGNOSIS — Z91199 Patient's noncompliance with other medical treatment and regimen due to unspecified reason: Secondary | ICD-10-CM

## 2022-06-23 NOTE — Progress Notes (Signed)
   Complete physical exam  Patient: Jasmine Mejia   DOB: 09/26/1997   56 y.o. Female  MRN: [id number]  Subjective:    No chief complaint on file.   Jasmine Mejia is a 56 y.o. female who presents today for a complete physical exam. She reports consuming a {diet types:17450} diet. {types:19826} She generally feels {DESC; WELL/FAIRLY WELL/POORLY:18703}. She reports sleeping {DESC; WELL/FAIRLY WELL/POORLY:18703}. She {does/does not:200015} have additional problems to discuss today.    Most recent fall risk assessment:    06/03/2022   10:42 AM  Fall Risk   Falls in the past year? 0  Number falls in past yr: 0  Injury with Fall? 0  Risk for fall due to : No Fall Risks  Follow up Falls evaluation completed     Most recent depression screenings:    06/03/2022   10:42 AM 04/24/2021   10:46 AM  PHQ 2/9 Scores  PHQ - 2 Score 0 0  PHQ- 9 Score 5     {VISON DENTAL STD PSA (Optional):27386}  {History (Optional):23778}  Patient Care Team: Aerial Dilley, NP as PCP - General (Nurse Practitioner)   Outpatient Medications Prior to Visit  Medication Sig   fluticasone (FLONASE) 50 MCG/ACT nasal spray Place 2 sprays into both nostrils in the morning and at bedtime. After 7 days, reduce to once daily.   norgestimate-ethinyl estradiol (SPRINTEC 28) 0.25-35 MG-MCG tablet Take 1 tablet by mouth daily.   Nystatin POWD Apply liberally to affected area 2 times per day   spironolactone (ALDACTONE) 100 MG tablet Take 1 tablet (100 mg total) by mouth daily.   No facility-administered medications prior to visit.    ROS        Objective:     There were no vitals taken for this visit. {Vitals History (Optional):23777}  Physical Exam   No results found for any visits on 07/09/22. {Show previous labs (optional):23779}    Assessment & Plan:    Routine Health Maintenance and Physical Exam  Immunization History  Administered Date(s) Administered   DTaP 12/10/1999, 02/05/2000,  04/15/2000, 12/30/2000, 07/15/2004   Hepatitis A 05/11/2008, 05/17/2009   Hepatitis B 09/27/1999, 11/04/1999, 04/15/2000   HiB (PRP-OMP) 12/10/1999, 02/05/2000, 04/15/2000, 12/30/2000   IPV 12/10/1999, 02/05/2000, 10/04/2000, 07/15/2004   Influenza,inj,Quad PF,6+ Mos 08/17/2014   Influenza-Unspecified 11/16/2012   MMR 10/04/2001, 07/15/2004   Meningococcal Polysaccharide 05/16/2012   Pneumococcal Conjugate-13 12/30/2000   Pneumococcal-Unspecified 04/15/2000, 06/29/2000   Tdap 05/16/2012   Varicella 10/04/2000, 05/11/2008    Health Maintenance  Topic Date Due   HIV Screening  Never done   Hepatitis C Screening  Never done   INFLUENZA VACCINE  07/07/2022   PAP-Cervical Cytology Screening  07/09/2022 (Originally 09/25/2020)   PAP SMEAR-Modifier  07/09/2022 (Originally 09/25/2020)   TETANUS/TDAP  07/09/2022 (Originally 05/16/2022)   HPV VACCINES  Discontinued   COVID-19 Vaccine  Discontinued    Discussed health benefits of physical activity, and encouraged her to engage in regular exercise appropriate for her age and condition.  Problem List Items Addressed This Visit   None Visit Diagnoses     Annual physical exam    -  Primary   Cervical cancer screening       Need for Tdap vaccination          No follow-ups on file.     Larraine Argo, NP   

## 2022-07-17 ENCOUNTER — Encounter: Payer: Self-pay | Admitting: Internal Medicine

## 2022-07-21 NOTE — Progress Notes (Unsigned)
   Established Patient Office Visit  Subjective   Patient ID: CHELLSEA BECKERS, female   DOB: [birth year] Age: 56 y.o. MRN: [id number]   No chief complaint on file.   HPI Pleasant 56 year old female presenting today for the following:  Hypertension:  Hyperlipidemia:  Mood:  Prediabetes:   Objective:    There were no vitals filed for this visit.  Physical Exam   No results found for this or any previous visit (from the past 24 hour(s)).   {Labs (Optional):23779}  The 10-year ASCVD risk score (Arnett DK, et al., 2019) is: 5.5%   Values used to calculate the score:     Age: 81 years     Sex: Female     Is Non-Hispanic African American: No     Diabetic: No     Tobacco smoker: Yes     Systolic Blood Pressure: 122 mmHg     Is BP treated: Yes     HDL Cholesterol: 48 mg/dL     Total Cholesterol: 186 mg/dL   Assessment & Plan:   No problem-specific Assessment & Plan notes found for this encounter.   No follow-ups on file.  ___________________________________________ Thayer Ohm, DNP, APRN, FNP-BC Primary Care and Sports Medicine [facility] [place]

## 2022-07-23 ENCOUNTER — Encounter: Payer: Self-pay | Admitting: Medical-Surgical

## 2022-07-23 ENCOUNTER — Ambulatory Visit (INDEPENDENT_AMBULATORY_CARE_PROVIDER_SITE_OTHER): Payer: 59 | Admitting: Medical-Surgical

## 2022-07-23 VITALS — BP 146/93 | HR 71 | Resp 20 | Ht 66.0 in | Wt 222.1 lb

## 2022-07-23 DIAGNOSIS — Z23 Encounter for immunization: Secondary | ICD-10-CM | POA: Diagnosis not present

## 2022-07-23 DIAGNOSIS — F3341 Major depressive disorder, recurrent, in partial remission: Secondary | ICD-10-CM | POA: Diagnosis not present

## 2022-07-23 DIAGNOSIS — R7303 Prediabetes: Secondary | ICD-10-CM

## 2022-07-23 DIAGNOSIS — I10 Essential (primary) hypertension: Secondary | ICD-10-CM | POA: Diagnosis not present

## 2022-07-23 DIAGNOSIS — F419 Anxiety disorder, unspecified: Secondary | ICD-10-CM

## 2022-07-23 DIAGNOSIS — E782 Mixed hyperlipidemia: Secondary | ICD-10-CM | POA: Diagnosis not present

## 2022-07-24 ENCOUNTER — Other Ambulatory Visit: Payer: Self-pay | Admitting: Medical-Surgical

## 2022-07-24 DIAGNOSIS — E782 Mixed hyperlipidemia: Secondary | ICD-10-CM

## 2022-07-24 LAB — COMPLETE METABOLIC PANEL WITH GFR
AG Ratio: 1.6 (calc) (ref 1.0–2.5)
ALT: 23 U/L (ref 6–29)
AST: 20 U/L (ref 10–35)
Albumin: 4.4 g/dL (ref 3.6–5.1)
Alkaline phosphatase (APISO): 76 U/L (ref 37–153)
BUN: 17 mg/dL (ref 7–25)
CO2: 30 mmol/L (ref 20–32)
Calcium: 10.1 mg/dL (ref 8.6–10.4)
Chloride: 105 mmol/L (ref 98–110)
Creat: 0.97 mg/dL (ref 0.50–1.03)
Globulin: 2.7 g/dL (calc) (ref 1.9–3.7)
Glucose, Bld: 110 mg/dL — ABNORMAL HIGH (ref 65–99)
Potassium: 5.2 mmol/L (ref 3.5–5.3)
Sodium: 142 mmol/L (ref 135–146)
Total Bilirubin: 0.4 mg/dL (ref 0.2–1.2)
Total Protein: 7.1 g/dL (ref 6.1–8.1)
eGFR: 70 mL/min/{1.73_m2} (ref >=60)

## 2022-07-24 LAB — CBC WITH DIFFERENTIAL/PLATELET
Absolute Monocytes: 297 cells/uL (ref 200–950)
Basophils Absolute: 28 cells/uL (ref 0–200)
Basophils Relative: 0.4 %
Eosinophils Absolute: 186 cells/uL (ref 15–500)
Eosinophils Relative: 2.7 %
HCT: 44.6 % (ref 35.0–45.0)
Hemoglobin: 14.7 g/dL (ref 11.7–15.5)
Lymphs Abs: 2946 cells/uL (ref 850–3900)
MCH: 30 pg (ref 27.0–33.0)
MCHC: 33 g/dL (ref 32.0–36.0)
MCV: 91 fL (ref 80.0–100.0)
MPV: 9.6 fL (ref 7.5–12.5)
Monocytes Relative: 4.3 %
Neutro Abs: 3443 cells/uL (ref 1500–7800)
Neutrophils Relative %: 49.9 %
Platelets: 340 10*3/uL (ref 140–400)
RBC: 4.9 10*6/uL (ref 3.80–5.10)
RDW: 13 % (ref 11.0–15.0)
Total Lymphocyte: 42.7 %
WBC: 6.9 10*3/uL (ref 3.8–10.8)

## 2022-07-24 LAB — LIPID PANEL
Cholesterol: 256 mg/dL — ABNORMAL HIGH (ref <200)
HDL: 54 mg/dL (ref >=50)
LDL Cholesterol (Calc): 174 mg/dL (calc) — ABNORMAL HIGH
Non-HDL Cholesterol (Calc): 202 mg/dL (calc) — ABNORMAL HIGH (ref <130)
Total CHOL/HDL Ratio: 4.7 (calc) (ref <5.0)
Triglycerides: 138 mg/dL (ref <150)

## 2022-07-24 LAB — HEMOGLOBIN A1C
Hgb A1c MFr Bld: 6 % of total Hgb — ABNORMAL HIGH (ref <5.7)
Mean Plasma Glucose: 126 mg/dL
eAG (mmol/L): 7 mmol/L

## 2022-07-24 MED ORDER — ROSUVASTATIN CALCIUM 20 MG PO TABS: 20.0000 mg | ORAL_TABLET | Freq: Every day | ORAL | 0 refills | Status: DC

## 2022-08-12 ENCOUNTER — Encounter: Payer: Self-pay | Admitting: Internal Medicine

## 2022-08-12 ENCOUNTER — Ambulatory Visit (INDEPENDENT_AMBULATORY_CARE_PROVIDER_SITE_OTHER): Payer: 59 | Admitting: Internal Medicine

## 2022-08-12 VITALS — BP 124/78 | HR 100 | Ht 66.0 in | Wt 221.5 lb

## 2022-08-12 DIAGNOSIS — Z9989 Dependence on other enabling machines and devices: Secondary | ICD-10-CM

## 2022-08-12 DIAGNOSIS — F17210 Nicotine dependence, cigarettes, uncomplicated: Secondary | ICD-10-CM | POA: Diagnosis not present

## 2022-08-12 DIAGNOSIS — G4733 Obstructive sleep apnea (adult) (pediatric): Secondary | ICD-10-CM | POA: Diagnosis not present

## 2022-08-12 DIAGNOSIS — J449 Chronic obstructive pulmonary disease, unspecified: Secondary | ICD-10-CM

## 2022-08-12 DIAGNOSIS — F17200 Nicotine dependence, unspecified, uncomplicated: Secondary | ICD-10-CM
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
## 2022-08-12 NOTE — Patient Instructions (Signed)
Please schedule follow up scheduled with myself in 2 months.  If my schedule is not open yet, we will contact you with a reminder closer to that time. Please call [phone] if you haven't heard from [place] a month before.   Start using your CPAP nightly again. I will send an order for the nasal pillows to [employer] today.  Continue stiolto inhaler Continue albuterol inhaler as needed. I still think quitting smoking is the most important thing you can do for your health.  What are the benefits of quitting smoking? Quitting smoking can lower your chances of getting or dying from heart disease, lung disease, kidney failure, infection, or cancer. It can also lower your chances of getting osteoporosis, a condition that makes your bones weak. Plus, quitting smoking can help your skin look younger and reduce the chances that you will have problems with sex.  Quitting smoking will improve your health no matter how old you are, and no matter how long or how much you have smoked.  What should I do if I want to quit smoking? The letters in the word "START" can help you remember the steps to take: S = Set a quit date. T = Tell family, friends, and the people around you that you plan to quit. A = Anticipate or plan ahead for the tough times you'll face while quitting. R = Remove cigarettes and other tobacco products from your home, car, and work. T = Talk to your doctor about getting help to quit.  How can my doctor or nurse help? Your doctor or nurse can give you advice on the best way to quit. He or she can also put you in touch with counselors or other people you can call for support. Plus, your doctor or nurse can give you medicines to: ?Reduce your craving for cigarettes ?Reduce the unpleasant symptoms that happen when you stop smoking (called "withdrawal symptoms"). You can also get help from a free phone line (1-800-QUIT-NOW) or go online to [employer].  What are the symptoms of  withdrawal? The symptoms include: ?Trouble sleeping ?Being irritable, anxious or restless ?Getting frustrated or angry ?Having trouble thinking clearly  Some people who stop smoking become temporarily depressed. Some people need treatment for depression, such as counseling or antidepressant medicines. Depressed people might: ?No longer enjoy or care about doing the things they used to like to do ?Feel sad, down, hopeless, nervous, or cranky most of the day, almost every day ?Lose or gain weight ?Sleep too much or too little ?Feel tired or like they have no energy ?Feel guilty or like they are worth nothing ?Forget things or feel confused ?Move and speak more slowly than usual ?Act restless or have trouble staying still ?Think about death or suicide  If you think you might be depressed, see your doctor or nurse. Only someone trained in mental health can tell for sure if you are depressed. If you ever feel like you might hurt yourself, go straight to the nearest emergency department. Or you can call for an ambulance (in the [place] and [place], dial 9-1-1) or call your doctor or nurse right away and tell them it is an emergency. You can also reach the [place] National Suicide Prevention Lifeline at 508-570-0003 or http://hill.com/.  How do medicines help you stop smoking? Different medicines work in different ways: ?Nicotine replacement therapy eases withdrawal and reduces your body's craving for nicotine, the main drug found in cigarettes. There are different forms of nicotine replacement, including  skin patches, lozenges, gum, nasal sprays, and "puffers" or inhalers. Many can be bought without a prescription, while others might require one. ?Bupropion is a prescription medicine that reduces your desire to smoke. This medicine is sold under the brand names Zyban and Wellbutrin. It is also available in a generic version, which is cheaper than brand name medicines. ?Varenicline (brand  names: Chantix, Champix) is a prescription medicine that reduces withdrawal symptoms and cigarette cravings. If you think you'd like to take varenicline and you have a history of depression, anxiety, or heart disease, discuss this with your doctor or nurse before taking the medicine. Varenicline can also increase the effects of alcohol in some people. It's a good idea to limit drinking while you're taking it, at least until you know how it affects you.  How does counseling work? Counseling can happen during formal office visits or just over the phone. A counselor can help you: ?Figure out what triggers your smoking and what to do instead ?Overcome cravings ?Figure out what went wrong when you tried to quit before  What works best? Studies show that people have the best luck at quitting if they take medicines to help them quit and work with a Veterinary surgeon. It might also be helpful to combine nicotine replacement with one of the prescription medicines that help people quit. In some cases, it might even make sense to take bupropion and varenicline together.  What about e-cigarettes? Sometimes people wonder if using electronic cigarettes, or "e-cigarettes," might help them quit smoking. Using e-cigarettes is also called "vaping." Doctors do not recommend e-cigarettes in place of medicines and counseling. That's because e-cigarettes still contain nicotine as well as other substances that might be harmful. It's not clear how they can affect a person's health in the long term.  Will I gain weight if I quit? Yes, you might gain a few pounds. But quitting smoking will have a much more positive effect on your health than weighing a few pounds more. Plus, you can help prevent some weight gain by being more active and eating less. Taking the medicine bupropion might help control weight gain.   What else can I do to improve my chances of quitting? You can: ?Start exercising. ?Stay away from smokers and places  that you associate with smoking. If people close to you smoke, ask them to quit with you. ?Keep gum, hard candy, or something to put in your mouth handy. If you get a craving for a cigarette, try one of these instead. ?Don't give up, even if you start smoking again. It takes most people a few tries before they succeed.  What if I am pregnant and I smoke? If you are pregnant, it's really important for the health of your baby that you quit. Ask your doctor what options you have, and what is safest for your baby

## 2022-10-14 ENCOUNTER — Ambulatory Visit: Payer: 59 | Admitting: Internal Medicine

## 2022-10-20 ENCOUNTER — Other Ambulatory Visit: Payer: Self-pay | Admitting: Medical-Surgical

## 2022-10-20 DIAGNOSIS — E782 Mixed hyperlipidemia: Secondary | ICD-10-CM

## 2022-10-26 ENCOUNTER — Ambulatory Visit: Payer: 59 | Admitting: Internal Medicine

## 2022-10-26 ENCOUNTER — Telehealth: Payer: Self-pay | Admitting: Medical-Surgical

## 2022-10-26 NOTE — Telephone Encounter (Signed)
Please contact patient. Appointment made via MyChart as a virtual. Unable to follow up on hypertension virtually unless she has a way to check her BP immediately before the appointment. Would prefer to see her in office if possible.  ___________________________________________ Thayer Ohm, DNP, APRN, FNP-BC Primary Care and Sports Medicine [facility] [place]

## 2022-10-26 NOTE — Telephone Encounter (Signed)
LVM for patient to call and either change appointment to in-person or keep at a MyChart (and let us know she can check her BP) 11/20 at 8:14 AM -MAJ

## 2022-10-27 ENCOUNTER — Telehealth (INDEPENDENT_AMBULATORY_CARE_PROVIDER_SITE_OTHER): Payer: Self-pay | Admitting: Medical-Surgical

## 2022-10-27 DIAGNOSIS — Z91199 Patient's noncompliance with other medical treatment and regimen due to unspecified reason: Secondary | ICD-10-CM

## 2022-10-27 NOTE — Progress Notes (Signed)
Patient called 10/26/2022 regarding the virtual nature of the visit and the need for this to be an inpatient appointment or have the ability to check her blood pressure at that time.  2 calls completed with voicemails on 10/26/2022.  Patient again called on 10/27/2022 and voicemail left to complete check-in for her appointment.  Voicemail left.  Text link to virtual visit platform sent at 1:27 PM.  Patient did not connect to the virtual waiting room.  Provider waited 10 minutes in the virtual waiting room before logging out at 1:38 PM.  Patient will need to reschedule her appointment for an in office visit. ___________________________________________ Thayer Ohm, DNP, APRN, FNP-BC Primary Care and Sports Medicine [facility] [place]

## 2022-11-02 ENCOUNTER — Encounter: Payer: Self-pay | Admitting: Medical-Surgical

## 2022-11-02 ENCOUNTER — Telehealth (INDEPENDENT_AMBULATORY_CARE_PROVIDER_SITE_OTHER): Payer: 59 | Admitting: Medical-Surgical

## 2022-11-02 DIAGNOSIS — I10 Essential (primary) hypertension: Secondary | ICD-10-CM | POA: Diagnosis not present

## 2022-11-02 DIAGNOSIS — J069 Acute upper respiratory infection, unspecified: Secondary | ICD-10-CM

## 2022-11-02 DIAGNOSIS — R918 Other nonspecific abnormal finding of lung field: Secondary | ICD-10-CM

## 2022-11-02 MED ORDER — LISINOPRIL 20 MG PO TABS: 20.0000 mg | ORAL_TABLET | Freq: Every day | ORAL | 1 refills | Status: DC

## 2022-11-02 NOTE — Progress Notes (Signed)
Virtual Visit via Video Note  I connected with Jasmine Mejia on 11/02/22 at 11:10 AM EST by a video enabled telemedicine application and verified that I am speaking with the correct person using two identifiers.   I discussed the limitations of evaluation and management by telemedicine and the availability of in person appointments. The patient expressed understanding and agreed to proceed.  Patient location: home Provider locations: office  Subjective:    CC: HTN follow up  HPI: Pleasant 56 year old female presenting today via MyChart video visit for the following:  HTN: has a blood pressure cuff at home and has been checking it regularly with average readings 130/83. Today, when she checked it, it was higher than it has been at 157/98. Follows a low sodium diet. Has been increasing her walking and is proud of losing 15lbs. Taking lisinopril 20mg  daily, tolerating well without side effects. Denies CP, SOB, palpitations, lower extremity edema, dizziness, headaches, or vision changes.  Reports that she has had a low grade fever since Wednesday but it has finally broken today. She has had upper respiratory congestion similar to a viral illness and has been taking Alka Seltzer Cold/Flu for symptom management for the last several days. Feeling better today.    Past medical history, Surgical history, Family history not pertinant except as noted below, Social history, Allergies, and medications have been entered into the medical record, reviewed, and corrections made.   Review of Systems: See HPI for pertinent positives and negatives.   Objective:    General: Speaking clearly in complete sentences without any shortness of breath.  Alert and oriented x3.  Normal judgment. No apparent acute distress.  Impression and Recommendations:    1. Essential hypertension BP elevated today but this is likely in the setting of OTC cold/flu medication use for the last several days. Continue Lisinopril  20mg  daily. Monitor BP at home with a goal of 130/80 or less and if consistently higher, return for medication management. Avoid OTC cold/flu medications unless taking a formula specifically made for patients with HTN.  - lisinopril (ZESTRIL) 20 MG tablet; Take 1 tablet (20 mg total) by mouth daily.  Dispense: 90 tablet; Refill: 1  2. Lung nodules Follow up CT chest ordered - CT Chest Wo Contrast; Future  3. Viral URI Symptoms and timeline consistent with a viral infection. Discussed conservative management of symptoms. If symptoms worsen or fail to improve, patient to let me know.    I discussed the assessment and treatment plan with the patient. The patient was provided an opportunity to ask questions and all were answered. The patient agreed with the plan and demonstrated an understanding of the instructions.   The patient was advised to call back or seek an in-person evaluation if the symptoms worsen or if the condition fails to improve as anticipated.  25 minutes of non-face-to-face time was provided during this encounter.  Return in about 3 months (around 02/02/2023) for HTN follow up (in person please).  , DNP, APRN, FNP-BC [facility]

## 2022-11-16 ENCOUNTER — Ambulatory Visit
Admission: RE | Admit: 2022-11-16 | Discharge: 2022-11-16 | Disposition: A | Discharge status: Home / Self Care | Payer: 59 | Source: Ambulatory Visit | Attending: Medical-Surgical | Admitting: Medical-Surgical

## 2022-11-16 DIAGNOSIS — N63 Unspecified lump in unspecified breast: Secondary | ICD-10-CM

## 2022-11-18 ENCOUNTER — Ambulatory Visit (INDEPENDENT_AMBULATORY_CARE_PROVIDER_SITE_OTHER): Payer: 59

## 2022-11-18 DIAGNOSIS — R918 Other nonspecific abnormal finding of lung field: Secondary | ICD-10-CM

## 2023-01-25 ENCOUNTER — Ambulatory Visit (INDEPENDENT_AMBULATORY_CARE_PROVIDER_SITE_OTHER): Payer: 59 | Admitting: Medical-Surgical

## 2023-01-25 ENCOUNTER — Encounter: Payer: Self-pay | Admitting: Medical-Surgical

## 2023-01-25 VITALS — BP 124/80 | HR 76 | Resp 17 | Ht 66.0 in | Wt 216.4 lb

## 2023-01-25 DIAGNOSIS — R7303 Prediabetes: Secondary | ICD-10-CM

## 2023-01-25 DIAGNOSIS — J4489 Other specified chronic obstructive pulmonary disease: Secondary | ICD-10-CM

## 2023-01-25 DIAGNOSIS — I10 Essential (primary) hypertension: Secondary | ICD-10-CM | POA: Diagnosis not present

## 2023-01-25 DIAGNOSIS — Z716 Tobacco abuse counseling: Secondary | ICD-10-CM

## 2023-01-25 DIAGNOSIS — F3341 Major depressive disorder, recurrent, in partial remission: Secondary | ICD-10-CM | POA: Diagnosis not present

## 2023-01-25 DIAGNOSIS — E782 Mixed hyperlipidemia: Secondary | ICD-10-CM

## 2023-01-25 DIAGNOSIS — F419 Anxiety disorder, unspecified: Secondary | ICD-10-CM

## 2023-01-25 DIAGNOSIS — J439 Emphysema, unspecified: Secondary | ICD-10-CM

## 2023-01-25 DIAGNOSIS — R110 Nausea: Secondary | ICD-10-CM

## 2023-01-25 MED ORDER — PANTOPRAZOLE SODIUM 40 MG PO TBEC: 40.0000 mg | DELAYED_RELEASE_TABLET | Freq: Every day | ORAL | 3 refills | Status: DC

## 2023-01-25 MED ORDER — LISINOPRIL 20 MG PO TABS: 20.0000 mg | ORAL_TABLET | Freq: Every day | ORAL | 1 refills | Status: DC

## 2023-01-25 MED ORDER — ROSUVASTATIN CALCIUM 20 MG PO TABS: 20.0000 mg | ORAL_TABLET | Freq: Every day | ORAL | 0 refills | Status: DC

## 2023-01-25 NOTE — Progress Notes (Signed)
Established Patient Office Visit  Subjective   Patient ID: Jasmine Mejia, female   DOB: 05/27/1966 Age: 57 y.o. MRN: [id number]   Chief Complaint  Patient presents with   Follow-up   Nausea    For a month    HPI Pleasant 57 year old female presenting today for the following:  Hypertension: Taking lisinopril 20 mg daily, tolerating well without side effects.  Working to follow a low-sodium diet.  Has a physically active job but no other intentional exercise. Denies CP, SOB, palpitations, lower extremity edema, dizziness, headaches, or vision changes.  COPD/tobacco use: Continues to smoke but reports that she has been working to cut down.  She has tried wearing nicotine patches however these tend to fall off during the day due to sweating while at work.  Had a lung CT about 2 months ago that found nodules in her left and right upper lobes.  Recommendation is to repeat her CT in 3 to 6 months.  She would like to do this sooner rather than later.  Reports that she does not have a good feeling about the CT findings.  Anxiety/depression: Currently managed by psychiatry.  She is taking Effexor, mirtazapine, and gabapentin as prescribed.  Hyperlipidemia: Taking Crestor 20 mg daily, tolerating well without side effects.  Trying to follow a low-fat heart healthy diet.  Prediabetes: Not currently on any medications to help manage this.  Working on dietary modifications.  Nausea: Reports that she has been having nausea every day for the last month.  Reports that it starts around midday and seems to last the rest of the day.  Seems to be worse with lying down.  Sometimes she vomits and reports it is liquidy and what ever color of what she may have drink in the immediate.  Before.  Reports that she feels hungry however she is not eating regularly.  She is having regular bowel movements with no melena or hematochezia.  Denies abdominal pain, cramping, bloating, and excessive flatulence.  She took  ibuprofen this Saturday when she developed a fever.  Notes her fever broke overnight and she has had no more issues.  Admits to taking Goody powders on a daily basis to help with her chronic pain.  Notes coughing up a small amount of blood-streaked sputum recently on 2 separate occasions however this has not been the norm.   Objective:    Vitals:   01/25/23 1407 01/25/23 1408  BP: 124/80 124/80  Pulse: 76   Resp: 17   Height: 5' 6"$  (1.676 m)   Weight: 216 lb 6.4 oz (98.2 kg)   SpO2: 100% 100%  BMI (Calculated): 34.94    Physical Exam Vitals reviewed.  Constitutional:      General: She is not in acute distress.    Appearance: Normal appearance. She is obese. She is not ill-appearing.  HENT:     Head: Normocephalic and atraumatic.  Cardiovascular:     Rate and Rhythm: Normal rate and regular rhythm.     Pulses: Normal pulses.     Heart sounds: Normal heart sounds.  Pulmonary:     Effort: Pulmonary effort is normal. No respiratory distress.     Breath sounds: Normal breath sounds. No wheezing, rhonchi or rales.  Abdominal:     General: Bowel sounds are normal. There is no distension.     Palpations: Abdomen is soft.     Tenderness: There is no abdominal tenderness. There is no guarding.  Skin:    General: Skin is  warm and dry.  Neurological:     Mental Status: She is alert and oriented to person, place, and time.  Psychiatric:        Mood and Affect: Mood normal.        Behavior: Behavior normal.        Thought Content: Thought content normal.        Judgment: Judgment normal.   No results found for this or any previous visit (from the past 24 hour(s)).     The 10-year ASCVD risk score (Arnett DK, et al., 2019) is: 7.6%   Values used to calculate the score:     Age: 77 years     Sex: Female     Is Non-Hispanic African American: No     Diabetic: No     Tobacco smoker: Yes     Systolic Blood Pressure: [id number] mmHg     Is BP treated: Yes     HDL Cholesterol: 54 mg/dL      Total Cholesterol: 256 mg/dL   Assessment & Plan:   1. Essential hypertension Blood pressure stable.  Continue lisinopril 20 mg daily.  Checking CMP. - lisinopril (ZESTRIL) 20 MG tablet; Take 1 tablet (20 mg total) by mouth daily.  Dispense: 90 tablet; Refill: 1 - COMPLETE METABOLIC PANEL WITH GFR  2. Anxiety 3. Recurrent major depressive disorder, in partial remission ([place]) Managed by psychiatry.  4. Prediabetes Checking hemoglobin A1c. - Hemoglobin A1c  5. Hyperlipidemia, mixed Continue rosuvastatin 20 mg daily. - rosuvastatin (CRESTOR) 20 MG tablet; Take 1 tablet (20 mg total) by mouth daily.  Dispense: 90 tablet; Refill: 0  6. Nausea Unclear etiology.  Checking H. pylori breath test.  Possible relation to GERD or medication side effect since she does take Goody powders on a daily basis.  Start Protonix 40 mg daily.  Discontinue Goody powders.  Use Tylenol for fever/discomfort and try to avoid NSAIDs. - H. pylori breath test  7. Tobacco abuse counseling 8. COPD with chronic bronchitis and emphysema ([place]) Discussed recommendations for tobacco cessation.  Recommend utilizing different measures to keep nicotine patches in place if she has trouble getting them to stick.  Also consider using nicotine gum or lozenges.  Continue cutting back on quantity smoked per day.  Discussed the diagnosis of emphysema and the damage to lungs with continued smoking.  Plan for repeat chest CT in approximately 1 month for follow-up on nodules.  Return in about 4 weeks (around 02/22/2023) for nausea follow up.  ___________________________________________ Clearnce Sorrel, DNP, APRN, FNP-BC [facility] [place]

## 2023-01-26 LAB — COMPLETE METABOLIC PANEL WITH GFR
AG Ratio: 1.4 (calc) (ref 1.0–2.5)
ALT: 22 U/L (ref 6–29)
AST: 20 U/L (ref 10–35)
Albumin: 4.1 g/dL (ref 3.6–5.1)
Alkaline phosphatase (APISO): 84 U/L (ref 37–153)
BUN: 14 mg/dL (ref 7–25)
CO2: 28 mmol/L (ref 20–32)
Calcium: 9.8 mg/dL (ref 8.6–10.4)
Chloride: 106 mmol/L (ref 98–110)
Creat: 0.97 mg/dL (ref 0.50–1.03)
Globulin: 3 g/dL (calc) (ref 1.9–3.7)
Glucose, Bld: 84 mg/dL (ref 65–99)
Potassium: 5.4 mmol/L — ABNORMAL HIGH (ref 3.5–5.3)
Sodium: 143 mmol/L (ref 135–146)
Total Bilirubin: 0.3 mg/dL (ref 0.2–1.2)
Total Protein: 7.1 g/dL (ref 6.1–8.1)
eGFR: 69 mL/min/{1.73_m2} (ref >=60)

## 2023-01-26 LAB — HEMOGLOBIN A1C
Hgb A1c MFr Bld: 6.3 % of total Hgb — ABNORMAL HIGH (ref <5.7)
Mean Plasma Glucose: 134 mg/dL
eAG (mmol/L): 7.4 mmol/L

## 2023-01-26 MED ORDER — RYBELSUS 3 MG PO TABS: 1.0000 | ORAL_TABLET | Freq: Every day | ORAL | 0 refills | Status: DC

## 2023-01-26 NOTE — Addendum Note (Signed)
Addended bySamuel Bouche on: 01/26/2023 01:13 PM   Modules accepted: Orders

## 2023-02-01 ENCOUNTER — Telehealth: Payer: Self-pay

## 2023-02-01 ENCOUNTER — Other Ambulatory Visit: Payer: Self-pay

## 2023-02-01 MED ORDER — ONDANSETRON 8 MG PO TBDP: 8.0000 mg | ORAL_TABLET | Freq: Three times a day (TID) | ORAL | 3 refills | Status: DC | PRN

## 2023-02-01 NOTE — Telephone Encounter (Signed)
Zofran sent to the pharmacy on file.  Please pass along my apologies for the admission.

## 2023-02-01 NOTE — Telephone Encounter (Signed)
Initiated Prior authorization [id number] '3MG'$  tablets Via: Covermymeds Case/Key:[id number] Status: approved  as of 02/01/23 Reason:Authorization Expiration Date: February 02, 2024. Notified Pt via: Mychart

## 2023-02-01 NOTE — Telephone Encounter (Signed)
Patient called stating that she was seen 01/25/2023 and you were supposed to send in something for nausea but nothing was sent in.

## 2023-02-01 NOTE — Telephone Encounter (Signed)
Patient stated that she wasn't able to get her Rybelsus the pharmacy stated that they were waiting on [place] to approve it.   The patient also wanted to let you know that she started her smoking patch this week.  She said to tell you thank you.

## 2023-02-22 ENCOUNTER — Encounter: Payer: Self-pay | Admitting: Medical-Surgical

## 2023-02-22 ENCOUNTER — Ambulatory Visit (INDEPENDENT_AMBULATORY_CARE_PROVIDER_SITE_OTHER): Payer: 59 | Admitting: Medical-Surgical

## 2023-02-22 VITALS — BP 161/108 | HR 83 | Resp 20 | Ht 66.0 in | Wt 210.3 lb

## 2023-02-22 DIAGNOSIS — I10 Essential (primary) hypertension: Secondary | ICD-10-CM | POA: Diagnosis not present

## 2023-02-22 DIAGNOSIS — R110 Nausea: Secondary | ICD-10-CM | POA: Diagnosis not present

## 2023-02-22 DIAGNOSIS — Z23 Encounter for immunization: Secondary | ICD-10-CM

## 2023-02-22 DIAGNOSIS — Z1211 Encounter for screening for malignant neoplasm of colon: Secondary | ICD-10-CM

## 2023-02-22 DIAGNOSIS — R918 Other nonspecific abnormal finding of lung field: Secondary | ICD-10-CM | POA: Diagnosis not present

## 2023-02-22 MED ORDER — RYBELSUS 7 MG PO TABS: 7.0000 mg | ORAL_TABLET | Freq: Every day | ORAL | 1 refills | Status: DC

## 2023-02-22 NOTE — Progress Notes (Signed)
Established Patient Office Visit  Subjective   Patient ID: Jasmine Mejia, female   DOB: Mar 18, 1966 Age: 57 y.o. MRN: [id number]   Chief Complaint  Patient presents with   Follow-up   Nausea   HPI Pleasant 57 year old female presenting today for follow-up on nausea.  Approximately 4 weeks ago, she was prescribed Protonix 40 mg daily due to issues with frequent nausea.  She was also instructed to stop using Goody powders and avoid NSAIDs. Today, she reports that she has been taking the Protonix daily, tolerating well without side effects. The medication has helped greatly with the nausea. She occasionally has some breakthrough nausea but this is minimal and responds well to Zofran.   Started Rybelsus 3mg  daily and has tolerated it well. No side effects or concerns with the medication and feels that she is ready to move up in the dose.   HTN: her BP was elevated on arrival. Notes that she was rushed this morning and forgot to take her BP medication. Plans to take it as soon as she gets home.   Lung CT showed nodules when done back in December. Recommended follow up in 3-6 months. Wants to go ahead and get this done.    Objective:    Vitals:   02/22/23 1435  BP: (!) 161/108  Pulse: 83  Resp: 20  Height: 5\' 6"  (1.676 m)  Weight: 210 lb 4.8 oz (95.4 kg)  SpO2: 97%  BMI (Calculated): 33.96    Physical Exam Vitals reviewed.  Constitutional:      General: She is not in acute distress.    Appearance: Normal appearance. She is obese. She is not ill-appearing.  HENT:     Head: Normocephalic and atraumatic.  Cardiovascular:     Rate and Rhythm: Normal rate and regular rhythm.     Pulses: Normal pulses.     Heart sounds: Normal heart sounds.  Pulmonary:     Effort: Pulmonary effort is normal. No respiratory distress.     Breath sounds: Normal breath sounds. No wheezing, rhonchi or rales.  Skin:    General: Skin is warm and dry.  Neurological:     Mental Status: She is alert  and oriented to person, place, and time.  Psychiatric:        Mood and Affect: Mood normal.        Behavior: Behavior normal.        Thought Content: Thought content normal.        Judgment: Judgment normal.   No results found for this or any previous visit (from the past 24 hour(s)).     The 10-year ASCVD risk score (Arnett DK, et al., 2019) is: 12.5%   Values used to calculate the score:     Age: 66 years     Sex: Female     Is Non-Hispanic African American: No     Diabetic: No     Tobacco smoker: Yes     Systolic Blood Pressure: [id number] mmHg     Is BP treated: Yes     HDL Cholesterol: 54 mg/dL     Total Cholesterol: 256 mg/dL   Assessment & Plan:   1. Nausea Greatly improved. Continue Protonix 40mg  daily. Ok to use Zofran prn.   2. Lung nodules Repeating CT chest per radiology recommendations.  - CT Chest Wo Contrast; Future  3. Essential hypertension BP elevated. Advised patient to make sure to take BP medication when she gets home. Also advised to make sure  to take this daily to prevent spikes in BP.   4. Need for shingles vaccine Shingrix #2 given in office today.  - Zoster Recombinant (Shingrix )  5. Colon cancer screening Cologuard ordered.  - Cologuard  Return in about 2 months (around 04/24/2023) for prediabetes.  ___________________________________________ Clearnce Sorrel, DNP, APRN, FNP-BC [facility] [place]

## 2023-03-05 LAB — COLOGUARD: COLOGUARD: NEGATIVE

## 2023-03-09 ENCOUNTER — Ambulatory Visit (INDEPENDENT_AMBULATORY_CARE_PROVIDER_SITE_OTHER): Payer: 59

## 2023-03-09 DIAGNOSIS — R918 Other nonspecific abnormal finding of lung field: Secondary | ICD-10-CM | POA: Diagnosis not present

## 2023-03-11 ENCOUNTER — Encounter: Payer: Self-pay | Admitting: Medical-Surgical

## 2023-04-19 ENCOUNTER — Telehealth: Payer: Self-pay | Admitting: General Practice

## 2023-04-19 NOTE — Transitions of Care (Post Inpatient/ED Visit) (Signed)
04/19/2023  Name: ABBEE Mejia MRN: [id number] DOB: [birth year]  Today's TOC FU Call Status: Today's TOC FU Call Status:: Successful TOC FU Call Competed TOC FU Call Complete Date: 04/19/23  Transition Care Management Follow-up Telephone Call Date of Discharge: 04/17/23 Discharge Facility: Other ([facility]) Name of Other ([facility]) Discharge Facility: [facility] Type of Discharge: Emergency Department Reason for ED Visit: Cardiac Conditions Cardiac Conditions Diagnosis:  (hypertension) How have you been since you were released from the hospital?: Better Any questions or concerns?: No  Items Reviewed: Did you receive and understand the discharge instructions provided?: Yes Medications obtained,verified, and reconciled?: Yes (Medications Reviewed) Any new allergies since your discharge?: No Dietary orders reviewed?: NA Do you have support at home?: Yes  Medications Reviewed Today: Medications Reviewed Today     Reviewed by Modesto Charon, RN (Registered Nurse) on 04/19/23 at 1008  Med List Status: <None>   Medication Order Taking? Sig Documenting Provider Last Dose Status Informant  albuterol (VENTOLIN HFA) 108 (90 Base) MCG/ACT inhaler [id number] No Inhale 2 puffs into the lungs every 6 (six) hours as needed for wheezing. Charlott Holler, MD Taking Active   aspirin EC 325 MG tablet [id number] No Take 1 tablet (325 mg total) by mouth 2 (two) times daily after a meal. Take x 1 month post op to decrease risk of blood clots. Marshia Ly, PA-C Taking Active   gabapentin (NEURONTIN) 600 MG tablet [id number] No Take 800 mg by mouth 3 (three) times daily. [provider] Taking Active Self  gabapentin (NEURONTIN) 800 MG tablet [id number] No Take 800 mg by mouth 3 (three) times daily. [provider] Taking Active   lisinopril (ZESTRIL) 20 MG tablet [id number] No Take 1 tablet (20 mg total) by mouth daily. Christen Butter, NP Taking Active   methadone (DOLOPHINE) 10  MG/5ML solution [id number] No Take 40 mLs (80 mg total) by mouth daily. Christen Butter, NP Taking Active            Med Note Sherrie Mustache, Ronaldo Miyamoto A   Mon May 26, 2021  8:51 AM) Facility confirmed 90 mg daily  mirtazapine (REMERON) 30 MG tablet [id number] No Take 30 mg by mouth at bedtime. [provider] Taking Active Self  Multiple Vitamin (MULTIVITAMIN WITH MINERALS) TABS tablet [id number] No Take 1 tablet by mouth in the morning. [provider] Taking Active Self  ondansetron (ZOFRAN-ODT) 8 MG disintegrating tablet [id number] No Take 1 tablet (8 mg total) by mouth every 8 (eight) hours as needed for nausea. Christen Butter, NP Taking Active   pantoprazole (PROTONIX) 40 MG tablet [id number] No Take 1 tablet (40 mg total) by mouth daily. Christen Butter, NP Taking Active   rosuvastatin (CRESTOR) 20 MG tablet [id number] No Take 1 tablet (20 mg total) by mouth daily. Christen Butter, NP Taking Active   Semaglutide (RYBELSUS) 7 MG TABS [id number]  Take 1 tablet (7 mg total) by mouth daily. Christen Butter, NP  Active   Tiotropium Bromide-Olodaterol (STIOLTO RESPIMAT) 2.5-2.5 MCG/ACT AERS [id number] No Inhale 2 puffs into the lungs daily. Charlott Holler, MD Taking Active   triamcinolone cream (KENALOG) 0.5 % [id number] No Apply 1 application. topically 2 (two) times daily. To affected areas. Christen Butter, NP Taking Active   venlafaxine XR (EFFEXOR XR) 75 MG 24 hr capsule [id number] No Take 1 capsule (75 mg total) by mouth daily with breakfast. Christen Butter, NP Taking Active             Home Care and Equipment/Supplies: Were  Home Health Services Ordered?: NA Any new equipment or medical supplies ordered?: NA  Functional Questionnaire: Do you need assistance with bathing/showering or dressing?: No Do you need assistance with meal preparation?: No Do you need assistance with eating?: No Do you have difficulty maintaining continence: No Do you need assistance with getting out of bed/getting out of a  chair/moving?: No Do you have difficulty managing or taking your medications?: No  Follow up appointments reviewed: PCP Follow-up appointment confirmed?: Yes Date of PCP follow-up appointment?: 04/26/23 Follow-up Provider: Christen Butter, NP [facility] Follow-up appointment confirmed?: NA Do you need transportation to your follow-up appointment?: No Do you understand care options if your condition(s) worsen?: Yes-patient verbalized understanding    SIGNATURE Modesto Charon, RN BSN

## 2023-04-26 ENCOUNTER — Ambulatory Visit: Payer: 59 | Admitting: Medical-Surgical

## 2023-04-26 ENCOUNTER — Encounter: Payer: Self-pay | Admitting: Medical-Surgical

## 2023-04-26 VITALS — BP 133/82 | HR 77 | Resp 20 | Ht 66.0 in | Wt 210.8 lb

## 2023-04-26 DIAGNOSIS — E782 Mixed hyperlipidemia: Secondary | ICD-10-CM | POA: Diagnosis not present

## 2023-04-26 DIAGNOSIS — R7303 Prediabetes: Secondary | ICD-10-CM | POA: Diagnosis not present

## 2023-04-26 DIAGNOSIS — F419 Anxiety disorder, unspecified: Secondary | ICD-10-CM | POA: Diagnosis not present

## 2023-04-26 DIAGNOSIS — F3341 Major depressive disorder, recurrent, in partial remission: Secondary | ICD-10-CM

## 2023-04-26 DIAGNOSIS — R21 Rash and other nonspecific skin eruption: Secondary | ICD-10-CM

## 2023-04-26 DIAGNOSIS — I10 Essential (primary) hypertension: Secondary | ICD-10-CM

## 2023-04-26 LAB — POCT GLYCOSYLATED HEMOGLOBIN (HGB A1C)
HbA1c, POC (prediabetic range): 5.9 % (ref 5.7–6.4)
Hemoglobin A1C: 5.9 % — AB (ref 4.0–5.6)

## 2023-04-26 MED ORDER — RYBELSUS 7 MG PO TABS: 7.0000 mg | ORAL_TABLET | Freq: Every day | ORAL | 3 refills | Status: DC

## 2023-04-26 MED ORDER — LISINOPRIL 20 MG PO TABS
20.0000 mg | ORAL_TABLET | Freq: Every day | ORAL | 3 refills | Status: DC
Start: 2023-04-26 — End: 2023-10-28

## 2023-04-26 MED ORDER — CLOBETASOL PROPIONATE 0.05 % EX CREA: 1.0000 | TOPICAL_CREAM | Freq: Two times a day (BID) | CUTANEOUS | 0 refills | Status: AC

## 2023-04-26 MED ORDER — VENLAFAXINE HCL ER 75 MG PO CP24: 75.0000 mg | ORAL_CAPSULE | Freq: Every day | ORAL | 3 refills | Status: DC

## 2023-04-26 MED ORDER — ONDANSETRON 8 MG PO TBDP: 8.0000 mg | ORAL_TABLET | Freq: Three times a day (TID) | ORAL | 3 refills | Status: AC | PRN

## 2023-04-26 MED ORDER — PANTOPRAZOLE SODIUM 40 MG PO TBEC: 40.0000 mg | DELAYED_RELEASE_TABLET | Freq: Every day | ORAL | 3 refills | Status: AC

## 2023-04-26 MED ORDER — ROSUVASTATIN CALCIUM 20 MG PO TABS
20.0000 mg | ORAL_TABLET | Freq: Every day | ORAL | 3 refills | Status: DC
Start: 2023-04-26 — End: 2023-10-28

## 2023-04-26 NOTE — Progress Notes (Signed)
        Established patient visit  History, exam, impression, and plan:  1. Essential hypertension Pleasant 57 year old female presenting today for follow-up on hypertension.  Reports that she did have an episode where she visited the ER when her blood pressure was significantly high.  At the time she was also experiencing significant anxiety as well as a headache with nausea.  She was able to get her blood pressure back down and was discharged home without significant medication changes.  Today she reports she has been taking lisinopril 20 mg daily, tolerating well without side effects.  Is been monitoring her blood pressures at home and reports that they are at goal.  Following a low-sodium diet and trying to stay physically active as tolerated.  Denies CP, SOB, LE edema, HA, dizziness, vision changes, and syncopal episodes.  HRRR, S1/S2 normal.  Lungs CTA, respirations even and unlabored.  No peripheral edema.  Blood pressure at goal today.  Continue lisinopril 20 mg daily. - lisinopril (ZESTRIL) 20 MG tablet; Take 1 tablet (20 mg total) by mouth daily.  Dispense: 90 tablet; Refill: 3  2. Prediabetes History of prediabetes with last reading at 6.3% in February.  Started on Rybelsus 7 mg daily and has been tolerating that well without side effects.  Working to follow a [employer].  Recheck of hemoglobin A1c today at 5.9%.  Continue Rybelsus 7 mg daily. - POCT HgB A1C  3. Hyperlipidemia, mixed History of hyperlipidemia currently managed with rosuvastatin 20 mg daily.  Tolerating well without side effects.  Following a low-fat heart healthy diet.  Up-to-date on lipid panel.  Continue rosuvastatin as prescribed.  Plan for labs at next visit. - rosuvastatin (CRESTOR) 20 MG tablet; Take 1 tablet (20 mg total) by mouth daily.  Dispense: 90 tablet; Refill: 3  4. Anxiety 5. Recurrent major depressive disorder, in partial remission ([facility]) Has been taking Effexor 75 mg daily to manage anxiety/depression  and feels that it works well for her.  Tolerates the medication without side effects.  Denies SI/HI.  Mood, affect, thought pattern, and cognition normal today.  Continue Effexor as prescribed. - venlafaxine XR (EFFEXOR XR) 75 MG 24 hr capsule; Take 1 capsule (75 mg total) by mouth daily with breakfast.  Dispense: 90 capsule; Refill: 3  6. Rash of hand To have issues with a rash on the back of her right hand.  She has been using triamcinolone cream however does not seem to be tremendously helpful.  She has noticed some improvement since starting this but the rash is still present.  On evaluation, raised erythematous rash to the lateral dorsal hand affecting the knuckles and extending towards the wrist.  Discontinue triamcinolone.  Switching to clobetasol twice daily for up to 14 days.  Stressed the importance of avoiding excessive use and that she will need to take at least 2 weeks break between using this due to the potency of the steroid.  Patient verbalized understanding and is agreeable to the plan.  Procedures performed this visit: None.  Return in about 6 months (around 10/27/2023) for chronic disease follow up.  __________________________________ Thayer Ohm, DNP, APRN, FNP-BC Primary Care and Sports Medicine [facility] [place]

## 2023-05-19 ENCOUNTER — Telehealth: Payer: Self-pay | Admitting: General Practice

## 2023-05-19 NOTE — Transitions of Care (Post Inpatient/ED Visit) (Signed)
05/19/2023  Name: Jasmine Mejia MRN: [id number] DOB: [birth year]  Today's TOC FU Call Status: Today's TOC FU Call Status:: Successful TOC FU Call Competed TOC FU Call Complete Date: 05/19/23  Transition Care Management Follow-up Telephone Call Date of Discharge: 05/18/23 Discharge Facility: Other ([facility]) Name of Other ([facility]) Discharge Facility: [facility] Type of Discharge: Emergency Department Reason for ED Visit: Respiratory Respiratory Diagnosis: Pnuemonia (and bronchitis) How have you been since you were released from the hospital?: Same Any questions or concerns?: No  Items Reviewed: Did you receive and understand the discharge instructions provided?: Yes Medications obtained,verified, and reconciled?: Yes (Medications Reviewed) Any new allergies since your discharge?: No Dietary orders reviewed?: NA Do you have support at home?: Yes  Medications Reviewed Today: Medications Reviewed Today     Reviewed by Modesto Charon, RN (Registered Nurse) on 05/19/23 at [phone]  Med List Status: <None>   Medication Order Taking? Sig Documenting Provider Last Dose Status Informant  albuterol (VENTOLIN HFA) 108 (90 Base) MCG/ACT inhaler [id number]  Inhale 2 puffs into the lungs every 6 (six) hours as needed for wheezing. Charlott Holler, MD  Active   aspirin EC 325 MG tablet [id number]  Take 1 tablet (325 mg total) by mouth 2 (two) times daily after a meal. Take x 1 month post op to decrease risk of blood clots. Marshia Ly, PA-C  Active   benzonatate (TESSALON) 100 MG capsule [id number] Yes Take by mouth. [provider]  Active   clobetasol cream (TEMOVATE) 0.05 % [id number]  Apply 1 Application topically 2 (two) times daily. Christen Butter, NP  Active   doxycycline (VIBRAMYCIN) 100 MG capsule [id number] Yes Take by mouth. [provider]  Active   gabapentin (NEURONTIN) 800 MG tablet [id number]  Take 800 mg by mouth 3 (three) times daily. [provider]   Active   lisinopril (ZESTRIL) 20 MG tablet [id number]  Take 1 tablet (20 mg total) by mouth daily. Christen Butter, NP  Active   methadone (DOLOPHINE) 10 MG/5ML solution [id number]  Take 40 mLs (80 mg total) by mouth daily. Christen Butter, NP  Active            Med Note Sherrie Mustache, Ronaldo Miyamoto A   Mon May 26, 2021  8:51 AM) Facility confirmed 90 mg daily  mirtazapine (REMERON) 30 MG tablet [id number]  Take 30 mg by mouth at bedtime. [provider]  Active Self  Multiple Vitamin (MULTIVITAMIN WITH MINERALS) TABS tablet [id number]  Take 1 tablet by mouth in the morning. [provider]  Active Self  ondansetron (ZOFRAN-ODT) 8 MG disintegrating tablet [id number]  Take 1 tablet (8 mg total) by mouth every 8 (eight) hours as needed for nausea. Christen Butter, NP  Active   pantoprazole (PROTONIX) 40 MG tablet [id number]  Take 1 tablet (40 mg total) by mouth daily. Christen Butter, NP  Active   predniSONE (DELTASONE) 20 MG tablet [id number] Yes Take by mouth. [provider]  Active   rosuvastatin (CRESTOR) 20 MG tablet [id number]  Take 1 tablet (20 mg total) by mouth daily. Christen Butter, NP  Active   Semaglutide (RYBELSUS) 7 MG TABS [id number]  Take 1 tablet (7 mg total) by mouth daily. Christen Butter, NP  Active   Tiotropium Bromide-Olodaterol (STIOLTO RESPIMAT) 2.5-2.5 MCG/ACT AERS [id number]  Inhale 2 puffs into the lungs daily. Charlott Holler, MD  Active   venlafaxine XR (EFFEXOR XR) 75 MG 24 hr capsule [id number]  Take 1 capsule (75 mg total) by mouth daily  with breakfast. Christen Butter, NP  Active             Home Care and Equipment/Supplies: Were Home Health Services Ordered?: NA Any new equipment or medical supplies ordered?: NA  Functional Questionnaire: Do you need assistance with bathing/showering or dressing?: No Do you need assistance with meal preparation?: No Do you need assistance with eating?: No Do you have difficulty maintaining continence: No Do you need assistance with getting out of  bed/getting out of a chair/moving?: No Do you have difficulty managing or taking your medications?: No  Follow up appointments reviewed: PCP Follow-up appointment confirmed?: No MD Provider Line [phone] Given: No (Patient will call back as needed.) [facility]ist Hospital Follow-up appointment confirmed?: NA Do you need transportation to your follow-up appointment?: No Do you understand care options if your condition(s) worsen?: Yes-patient verbalized understanding    SIGNATURE Modesto Charon, RN BSN Nurse Health Advisor

## 2023-06-23 ENCOUNTER — Encounter: Payer: Self-pay | Admitting: Medical-Surgical

## 2023-07-21 ENCOUNTER — Telehealth: Payer: Self-pay

## 2023-07-21 NOTE — Telephone Encounter (Signed)
Jasmine Mejia reports a loss of consciousness. She states she didn't realize she passed out until she woke up in bed holding her work shirt. I advised her to go to the ED ASAP.

## 2023-09-01 NOTE — Therapy (Unsigned)
OUTPATIENT PHYSICAL THERAPY LOWER EXTREMITY EVALUATION   Patient Name: Jasmine Mejia MRN: [id number] DOB:1966-08-19, 57 y.o., female Today's Date: 09/01/2023  END OF SESSION:   Past Medical History:  Diagnosis Date   Anxiety    Arthritis    Asthma    Depression    Hypertension    Pneumonia 2016   Pre-diabetes    Past Surgical History:  Procedure Laterality Date   APPENDECTOMY     CHOLECYSTECTOMY     CHONDROPLASTY Right 10/09/2020   Procedure: CHONDROPLASTY;  Surgeon: Bjorn Pippin, MD;  Location: [facility];  Service: Orthopedics;  Laterality: Right;   KNEE ARTHROSCOPY WITH LATERAL MENISECTOMY Right 10/09/2020   Procedure: KNEE ARTHROSCOPY WITH LATERAL MENISECTOMY;  Surgeon: Bjorn Pippin, MD;  Location: [facility];  Service: Orthopedics;  Laterality: Right;   KNEE ARTHROSCOPY WITH MEDIAL MENISECTOMY Right 10/09/2020   Procedure: KNEE ARTHROSCOPY WITH MEDIAL MENISECTOMY;  Surgeon: Bjorn Pippin, MD;  Location: [facility];  Service: Orthopedics;  Laterality: Right;   KNEE ARTHROSCOPY WITH SUBCHONDROPLASTY Right 10/09/2020   Procedure: KNEE ARTHROSCOPY WITH SUBCHONDROPLASTY;  Surgeon: Bjorn Pippin, MD;  Location:  SURGERY CENTER;  Service: Orthopedics;  Laterality: Right;   TOTAL KNEE ARTHROPLASTY Right 05/30/2021   Procedure: TOTAL KNEE ARTHROPLASTY;  Surgeon: Jodi Geralds, MD;  Location: [facility];  Service: Orthopedics;  Laterality: Right;   TUBAL LIGATION     Patient Active Problem List   Diagnosis Date Noted   COPD with chronic bronchitis and emphysema (HCC) 01/25/2023   Effusion of knee joint right 12/09/2020   Primary osteoarthritis of right knee 08/23/2020   Prediabetes 08/23/2020   Essential hypertension 08/23/2020   Anxiety 08/23/2020   Depression 08/23/2020   Asthma 08/23/2020   Hyperlipidemia, mixed 08/23/2020   Post-op pain 05/26/2012   Ruptured appendicitis 05/18/2012    PCP: Christen Butter, NP  REFERRING  PROVIDER: Dr Jodi Geralds   REFERRING DIAG: L knee pain   THERAPY DIAG:  No diagnosis found.  Rationale for Evaluation and Treatment: Rehabilitation  ONSET DATE: 02/05/23  SUBJECTIVE:   SUBJECTIVE STATEMENT: Patient reports onset of L knee pain ~ 6 months ago with no known injury. She received a cortisone injection ~ 1 week ago which has helped the knee pain but she has has continued pain in the L hamstring. She can't straighten the knee all the way out.   PERTINENT HISTORY: R TKA 2021; arthritis; COPD; HTN; pre-diabetic; anxiety; depression PAIN:  Are you having pain? Yes: NPRS scale: 8-9/10 Pain location: L hamstrings and calf Pain description: aching  Aggravating factors: working and being up on the leg    Relieving factors: shot; sitting still not doing anything   PRECAUTIONS: None  RED FLAGS: None   WEIGHT BEARING RESTRICTIONS: No  FALLS:  Has patient fallen in last 6 months? 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 Household chores; yard work; shopping   PLOF: Independent  PATIENT GOALS: get the pain in the knee and HS gone; get the leg right   NEXT MD VISIT: Christen Butter, NP 10/28/23  OBJECTIVE:   DIAGNOSTIC FINDINGS: none for L knee in epic   PATIENT SURVEYS:  FOTO 31; goal 48   COGNITION:  Overall cognitive status: Within functional limits for tasks assessed     SENSATION: WFL's   EDEMA:  Mild to moderate edema L knee   MUSCLE LENGTH: Hamstrings: Right 60 deg; Left 45 deg   POSTURE: rounded shoulders, forward head, flexed trunk , and weight shift right  PALPATION: Muscular tightness in the L hamstrings and calf   LOWER EXTREMITY ROM:  Active ROM Right eval Left eval  Hip flexion  [facility] WFL  Hip extension 0 0  Hip abduction    Hip adduction    Hip internal rotation    Hip external rotation    Knee flexion 121 104  Knee extension 0 -17  Ankle dorsiflexion 10 0  Ankle plantarflexion    Ankle inversion    Ankle eversion     (Blank rows = not tested)  LOWER EXTREMITY MMT:  MMT Right eval Left eval  Hip flexion 5 4  Hip extension 4+ 4-  Hip abduction 5 4  Hip adduction    Hip internal rotation    Hip external rotation    Knee flexion 5 4  Knee extension 5 4+  Ankle dorsiflexion    Ankle plantarflexion    Ankle inversion    Ankle eversion     (Blank rows = not tested)  FUNCTIONAL TESTS:  5 times sit to stand: 25.23 seconds - use of bilat UE's wt shifted to R; crepitus L knee last 2 reps   GAIT: Distance walked: 40 ft Assistive device utilized: None Level of assistance: Complete Independence Comments: antalgic gait with L LE in hip and knee flexin; decreased wt bearing L LE in stance phase   [facility] Adult PT Treatment:                                                DATE: 09/02/23 Therapeutic Exercise: Supine  Prone Sitting  Standing  Manual Therapy: Trial of DN at next visit  Neuromuscular re-ed: Discussed importance of standing with knee straight as much as possible  Modalities: Cold pack L hamstrings x 10 min  Consider trial of TENS for pain management and muscle relaxation Self Care: Myofacial ball release HS in sitting Massage stick quads; HS; calf    PATIENT EDUCATION:  Education details: POC; HEP  Person educated: Patient Education method: Explanation, Demonstration, Tactile cues, Verbal cues, and Handouts Education comprehension: verbalized understanding, returned demonstration, verbal cues required, tactile cues required, and needs further education  HOME EXERCISE PROGRAM: Access Code: [email or web address] URL: [email or web address] Date: 09/02/2023 Prepared by: Corlis Leak  Program Notes massage stick  "The Stick"stretch  out strap - green strap 4 inch plastic ball  - - sportime all ball  Exercises - Hooklying Hamstring Stretch with Strap  - 2 x daily - 7 x weekly - 1 sets - 3 reps - 30 sec  hold - Supine Calf Stretch with Strap  - 2 x daily - 7 x weekly - 1 sets - 3 reps - 30 sec  hold - Supine Quad Set  - 2 x daily - 7 x weekly - 1 sets - 10 reps - 3 sec  hold - Prone Quadriceps Set  - 2 x daily - 7 x weekly - 1-2 sets - 10 reps - 5-10 sec  hold - Prone Gluteal Sets  - 2 x daily - 7 x weekly - 1 sets - 10  reps - 10 sec  hold - Seated Hamstring Stretch  - 2 x daily - 7 x weekly - 1 sets - 3 reps - 30 sec  hold - Standing Terminal Knee Extension at Wall with Ball  - 2 x daily - 7 x weekly - 1-2 sets - 10 reps - 5-10 sec  hold  Patient Education - Trigger Point Dry Needling  ASSESSMENT:  CLINICAL IMPRESSION: Patient is a 57 y.o. female who was seen today for physical therapy evaluation and treatment for L knee pain with no known injury. She had gradual onset of L knee and hamstring pain ~ 6 months ago. She received a cortisone injection for the L knee ~ 1 week ago with good improvement in the L knee pain. The L hamstring pain has continued. She also has some pain in the L calf. Patient presents with antalgic gait; significant limitations in L LE ROM; strength; functional activity tolerance. Patient will benefit from PT to address problems identified.   OBJECTIVE IMPAIRMENTS: Abnormal gait, decreased activity tolerance, decreased balance, decreased mobility, difficulty walking, decreased ROM, decreased strength, increased edema, increased fascial restrictions, impaired flexibility, postural dysfunction, and pain.   ACTIVITY LIMITATIONS: carrying, lifting, bending, sitting, standing, squatting, stairs, transfers, and locomotion level  PARTICIPATION LIMITATIONS: meal prep, cleaning, laundry, driving, shopping, community activity, occupation, and yard work  PERSONAL FACTORS: Behavior pattern, Education, Fitness,  Past/current experiences, and Time since onset of injury/illness/exacerbation are also affecting patient's functional outcome.   REHAB POTENTIAL: Good  CLINICAL DECISION MAKING: Stable/uncomplicated  EVALUATION COMPLEXITY: Low   GOALS: Goals reviewed with patient? Yes  SHORT TERM GOALS: Target date: 10/14/2023   Independent in initial HEP  Baseline: Goal status: INITIAL  2.  Increase AROM L knee to  0 deg extension and 115 deg flexion  Baseline:  Goal status: INITIAL   LONG TERM GOALS: Target date: 11/25/2023   Improve gait pattern with patient to demonstrate more normal gait pattern with good wt shift and wt bearing L LE in stance to toe off L LE Baseline:  Goal status: INITIAL  2.  Full pain free ROM L knee  Baseline:  Goal status: INITIAL  3.  5/5 strength L LE  Baseline:  Goal status: INITIAL  4.  Patient reports return to all normal functional activities  Baseline:  Goal status: INITIAL  5.  Independent in HEP, including aquatic program as indicated  Baseline:  Goal status: INITIAL  6.  Improve functional limitation score to 48 Baseline:  Goal status: INITIAL   PLAN:  PT FREQUENCY: 2x/week  PT DURATION: 12 weeks  PLANNED INTERVENTIONS: Therapeutic exercises, Therapeutic activity, Neuromuscular re-education, Balance training, Gait training, Patient/Family education, Self Care, Joint mobilization, Stair training, Aquatic Therapy, Dry Needling, Electrical stimulation, Spinal mobilization, Cryotherapy, Moist heat, Taping, Vasopneumatic device, Ultrasound, Ionotophoresis 4mg /ml Dexamethasone, Manual therapy, and Re-evaluation  PLAN FOR NEXT SESSION: review and progress with exercises; manual work, DN, modalities as indicated    [employer], PT 09/01/2023, 2:17 PM

## 2023-09-02 ENCOUNTER — Encounter: Payer: Self-pay | Admitting: Rehabilitative and Restorative Service Providers"

## 2023-09-02 ENCOUNTER — Other Ambulatory Visit: Payer: Self-pay

## 2023-09-02 ENCOUNTER — Ambulatory Visit: Payer: 59 | Attending: Orthopedic Surgery | Admitting: Rehabilitative and Restorative Service Providers"

## 2023-09-02 DIAGNOSIS — M25662 Stiffness of left knee, not elsewhere classified: Secondary | ICD-10-CM | POA: Diagnosis not present

## 2023-09-02 DIAGNOSIS — M6281 Muscle weakness (generalized): Secondary | ICD-10-CM | POA: Diagnosis not present

## 2023-09-02 DIAGNOSIS — G8929 Other chronic pain: Secondary | ICD-10-CM

## 2023-09-02 DIAGNOSIS — R29898 Other symptoms and signs involving the musculoskeletal system: Secondary | ICD-10-CM

## 2023-09-02 DIAGNOSIS — M25562 Pain in left knee: Secondary | ICD-10-CM | POA: Diagnosis present

## 2023-09-03 ENCOUNTER — Other Ambulatory Visit: Payer: Self-pay | Admitting: Medical-Surgical

## 2023-09-03 DIAGNOSIS — I10 Essential (primary) hypertension: Secondary | ICD-10-CM

## 2023-09-09 ENCOUNTER — Other Ambulatory Visit: Payer: Self-pay

## 2023-09-09 ENCOUNTER — Ambulatory Visit: Payer: 59 | Attending: Orthopedic Surgery

## 2023-09-09 DIAGNOSIS — G8929 Other chronic pain: Secondary | ICD-10-CM | POA: Diagnosis present

## 2023-09-09 DIAGNOSIS — M25662 Stiffness of left knee, not elsewhere classified: Secondary | ICD-10-CM | POA: Diagnosis present

## 2023-09-09 DIAGNOSIS — M25562 Pain in left knee: Secondary | ICD-10-CM | POA: Diagnosis present

## 2023-09-09 DIAGNOSIS — M6281 Muscle weakness (generalized): Secondary | ICD-10-CM | POA: Insufficient documentation

## 2023-09-09 DIAGNOSIS — M1712 Unilateral primary osteoarthritis, left knee: Secondary | ICD-10-CM | POA: Insufficient documentation

## 2023-09-09 DIAGNOSIS — R29898 Other symptoms and signs involving the musculoskeletal system: Secondary | ICD-10-CM | POA: Diagnosis present

## 2023-09-09 NOTE — Therapy (Signed)
OUTPATIENT PHYSICAL THERAPY LOWER EXTREMITY TREATMENT   Patient Name: Jasmine Mejia MRN: [id number] DOB:1966/08/01, 57 y.o., female Today's Date: 09/09/2023  END OF SESSION:  PT End of Session - 09/09/23 1621     Visit Number 2    Number of Visits 24    Date for PT Re-Evaluation 11/25/23    Authorization Type insurance info not available    PT Start Time 1538    PT Stop Time 1620    PT Time Calculation (min) 42 min    Activity Tolerance Patient tolerated treatment well    Behavior During Therapy WFL for tasks assessed/performed             Past Medical History:  Diagnosis Date   Anxiety    Arthritis    Asthma    Depression    Hypertension    Pneumonia 2016   Pre-diabetes    Past Surgical History:  Procedure Laterality Date   APPENDECTOMY     CHOLECYSTECTOMY     CHONDROPLASTY Right 10/09/2020   Procedure: CHONDROPLASTY;  Surgeon: Jasmine Pippin, MD;  Location: [facility];  Service: Orthopedics;  Laterality: Right;   KNEE ARTHROSCOPY WITH LATERAL MENISECTOMY Right 10/09/2020   Procedure: KNEE ARTHROSCOPY WITH LATERAL MENISECTOMY;  Surgeon: Jasmine Pippin, MD;  Location: [facility];  Service: Orthopedics;  Laterality: Right;   KNEE ARTHROSCOPY WITH MEDIAL MENISECTOMY Right 10/09/2020   Procedure: KNEE ARTHROSCOPY WITH MEDIAL MENISECTOMY;  Surgeon: Jasmine Pippin, MD;  Location: [facility];  Service: Orthopedics;  Laterality: Right;   KNEE ARTHROSCOPY WITH SUBCHONDROPLASTY Right 10/09/2020   Procedure: KNEE ARTHROSCOPY WITH SUBCHONDROPLASTY;  Surgeon: Jasmine Pippin, MD;  Location: [facility];  Service: Orthopedics;  Laterality: Right;   TOTAL KNEE ARTHROPLASTY Right 05/30/2021   Procedure: TOTAL KNEE ARTHROPLASTY;  Surgeon: Jasmine Geralds, MD;  Location: [facility];  Service: Orthopedics;  Laterality: Right;   TUBAL LIGATION     Patient Active Problem List   Diagnosis Date Noted   COPD with chronic bronchitis and  emphysema (HCC) 01/25/2023   Effusion of knee joint right 12/09/2020   Primary osteoarthritis of right knee 08/23/2020   Prediabetes 08/23/2020   Essential hypertension 08/23/2020   Anxiety 08/23/2020   Depression 08/23/2020   Asthma 08/23/2020   Hyperlipidemia, mixed 08/23/2020   Post-op pain 05/26/2012   Ruptured appendicitis 05/18/2012    PCP: Jasmine Butter, NP  REFERRING PROVIDER: Dr Jasmine Mejia   REFERRING DIAG: L knee pain   THERAPY DIAG:  Chronic pain of left knee  Other symptoms and signs involving the musculoskeletal system  Muscle weakness (generalized)  Stiffness of left knee, not elsewhere classified  Rationale for Evaluation and Treatment: Rehabilitation  ONSET DATE: 02/05/23  SUBJECTIVE:   SUBJECTIVE STATEMENT: [name] returned to the clinic stating she has been doing her stretches 2-3 times per day - twice on workdays and three times on the weekends. She has been having more discomfort in the posterior knee (hamstrings) than the L knee itself, which has felt much better since the cortisone injection three weeks ago.  Eval: Patient reports onset of L knee pain ~ 6 months ago with no known injury. She received a cortisone injection ~ 1 week ago which has helped the knee pain but she has has continued pain in the L hamstring. She can't straighten the knee all the way out.   PERTINENT HISTORY: R TKA 2021; arthritis; COPD; HTN; pre-diabetic; anxiety; depression PAIN:  Are you  having pain? Yes: NPRS scale: 8-9/10 Pain location: L hamstrings and calf Pain description: aching  Aggravating factors: working and being up on the leg    Relieving factors: shot; sitting still not doing anything   PRECAUTIONS: None  RED FLAGS: None   WEIGHT BEARING RESTRICTIONS: No  FALLS:  Has patient fallen in last 6 months? 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 Household chores; yard work; shopping   PLOF: Independent  PATIENT GOALS: get the pain in the knee and HS gone; get the leg right   NEXT MD VISIT: Jasmine Butter, NP 10/28/23  OBJECTIVE:   DIAGNOSTIC FINDINGS: none for L knee in epic   PATIENT SURVEYS:  FOTO 31; goal 48   COGNITION: Overall cognitive status: Within functional limits for tasks assessed     SENSATION: WFL's   EDEMA:  Mild to moderate edema L knee   MUSCLE LENGTH: Hamstrings: Right 60 deg; Left 45 deg   POSTURE: rounded shoulders, forward head, flexed trunk , and weight shift right  PALPATION: Muscular tightness in the L hamstrings and calf   LOWER EXTREMITY ROM:  Active ROM Right eval Left eval  Hip flexion [facility] WFL  Hip extension 0 0  Hip abduction    Hip adduction    Hip internal rotation    Hip external rotation    Knee flexion 121 104  Knee extension 0 -17  Ankle dorsiflexion 10 0  Ankle plantarflexion    Ankle inversion    Ankle eversion     (Blank rows = not tested)  LOWER EXTREMITY MMT:  MMT Right eval Left eval  Hip flexion 5 4  Hip extension 4+ 4-  Hip abduction 5 4  Hip adduction    Hip internal rotation    Hip external rotation    Knee flexion 5 4  Knee extension 5 4+  Ankle dorsiflexion    Ankle plantarflexion    Ankle inversion    Ankle eversion     (Blank rows = not tested)  FUNCTIONAL TESTS:  5 times sit to stand: 25.23 seconds - use of bilat UE's wt shifted to R; crepitus L knee last 2 reps   GAIT: Distance walked: 40 ft Assistive device utilized: None Level of assistance: Complete Independence Comments: antalgic gait with L LE in hip and knee flexin; decreased wt bearing L LE in stance phase   [facility] Adult PT Treatment:                                                DATE: 09/09/23 Therapeutic  Exercise: Manual L hamstring stretch, 90/90 position Manual L calf stretch, supine PROM L knee into extension, L foot on therapists shoulder, hands interlaced at distal femur Terminal knee extension, supine, 10 x 10 sec holds L hamstring stretch on table with ipsilateral pelvic rotation, 3 x 30 sec R foot elevated on step, L heel elevated L knee extension, x 10 reps Manual Therapy: AP/lateral glides of L femur, blocking L tibia, grade III Superior/medial glides of the L patella, grade III External rotation glides of the L tibia, grade  III  [facility] Adult PT Treatment:                                                DATE: 09/02/23 Therapeutic Exercise: Supine  Prone Sitting  Standing  Manual Therapy: Trial of DN at next visit  Neuromuscular re-ed: Discussed importance of standing with knee straight as much as possible  Modalities: Cold pack L hamstrings x 10 min  Consider trial of TENS for pain management and muscle relaxation Self Care: Myofacial ball release HS in sitting Massage stick quads; HS; calf  PATIENT EDUCATION:  Education details: POC; HEP  Person educated: Patient Education method: Explanation, Demonstration, Tactile cues, Verbal cues, and Handouts Education comprehension: verbalized understanding, returned demonstration, verbal cues required, tactile cues required, and needs further education  HOME EXERCISE PROGRAM: Access Code: [email or web address] URL: [email or web address] Date: 09/02/2023 Prepared by: Corlis Leak  Program Notes massage stick  "The Stick"stretch out strap - green strap 4 inch plastic ball  - - sportime all ball  Exercises - Hooklying Hamstring Stretch with Strap  - 2 x daily - 7 x weekly - 1 sets - 3 reps - 30 sec  hold - Supine Calf Stretch with Strap  - 2 x daily - 7 x weekly - 1 sets - 3 reps - 30 sec  hold - Supine Quad Set  - 2 x daily - 7 x weekly - 1 sets - 10 reps - 3 sec  hold - Prone Quadriceps Set  - 2 x daily - 7 x weekly - 1-2  sets - 10 reps - 5-10 sec  hold - Prone Gluteal Sets  - 2 x daily - 7 x weekly - 1 sets - 10 reps - 10 sec  hold - Seated Hamstring Stretch  - 2 x daily - 7 x weekly - 1 sets - 3 reps - 30 sec  hold - Standing Terminal Knee Extension at Wall with Ball  - 2 x daily - 7 x weekly - 1-2 sets - 10 reps - 5-10 sec  hold  Patient Education - Trigger Point Dry Needling  ASSESSMENT:  CLINICAL IMPRESSION: Continued to address L knee joint mobility and L knee soft tissue extensibility in today's session. Pain with L knee extension overpressures (anterior knee pain) decreased with treatment throughout the session, but was still present by the end of session. No particular intervention today seemed to improve L knee extension ROM more than the others. Finished with active knee extension exercises to move through increased range. Some soreness at the L knee at the end of session, patient stated she would ice at home. Physical therapy remains indicated.  Eval: Patient is a 57 y.o. female who was seen today for physical therapy evaluation and treatment for L knee pain with no known injury. She had gradual onset of L knee and hamstring pain ~ 6 months ago. She received a cortisone injection for the L knee ~ 1 week ago with good improvement in the L knee pain. The L hamstring pain has continued. She also has some pain in the L calf. Patient presents with antalgic gait; significant limitations in L LE ROM; strength; functional activity tolerance. Patient will benefit from PT to address problems identified.   OBJECTIVE IMPAIRMENTS: Abnormal gait, decreased activity tolerance, decreased balance, decreased mobility, difficulty walking, decreased ROM, decreased strength, increased edema, increased fascial restrictions,  impaired flexibility, postural dysfunction, and pain.   ACTIVITY LIMITATIONS: carrying, lifting, bending, sitting, standing, squatting, stairs, transfers, and locomotion level  PARTICIPATION LIMITATIONS:  meal prep, cleaning, laundry, driving, shopping, community activity, occupation, and yard work  PERSONAL FACTORS: Behavior pattern, Education, Fitness, Past/current experiences, and Time since onset of injury/illness/exacerbation are also affecting patient's functional outcome.   REHAB POTENTIAL: Good  CLINICAL DECISION MAKING: Stable/uncomplicated  EVALUATION COMPLEXITY: Low   GOALS: Goals reviewed with patient? Yes  SHORT TERM GOALS: Target date: 10/14/2023   Independent in initial HEP  Baseline: Goal status: INITIAL  2.  Increase AROM L knee to  0 deg extension and 115 deg flexion  Baseline:  Goal status: INITIAL   LONG TERM GOALS: Target date: 11/25/2023   Improve gait pattern with patient to demonstrate more normal gait pattern with good wt shift and wt bearing L LE in stance to toe off L LE Baseline:  Goal status: INITIAL  2.  Full pain free ROM L knee  Baseline:  Goal status: INITIAL  3.  5/5 strength L LE  Baseline:  Goal status: INITIAL  4.  Patient reports return to all normal functional activities  Baseline:  Goal status: INITIAL  5.  Independent in HEP, including aquatic program as indicated  Baseline:  Goal status: INITIAL  6.  Improve functional limitation score to 48 Baseline:  Goal status: INITIAL   PLAN:  PT FREQUENCY: 2x/week  PT DURATION: 12 weeks  PLANNED INTERVENTIONS: Therapeutic exercises, Therapeutic activity, Neuromuscular re-education, Balance training, Gait training, Patient/Family education, Self Care, Joint mobilization, Stair training, Aquatic Therapy, Dry Needling, Electrical stimulation, Spinal mobilization, Cryotherapy, Moist heat, Taping, Vasopneumatic device, Ultrasound, Ionotophoresis 4mg /ml Dexamethasone, Manual therapy, and Re-evaluation  PLAN FOR NEXT SESSION: review and progress with exercises; manual work, DN, modalities as indicated    Clelia Schaumann, PT 09/09/2023, 4:24 PM

## 2023-09-13 ENCOUNTER — Ambulatory Visit: Payer: 59 | Admitting: Physical Therapy

## 2023-09-16 ENCOUNTER — Ambulatory Visit: Payer: 59

## 2023-09-19 NOTE — Therapy (Signed)
OUTPATIENT PHYSICAL THERAPY LOWER EXTREMITY TREATMENT   Patient Name: Jasmine Mejia MRN: [id number] DOB:[birth year], 57 y.o., female Today's Date: 09/20/2023  END OF SESSION:  PT End of Session - 09/20/23 1537     Visit Number 3    Number of Visits 24    Date for PT Re-Evaluation 11/25/23    Authorization Type [employer] [id number]    PT Start Time 1537    PT Stop Time 1617    PT Time Calculation (min) 40 min    Activity Tolerance Patient tolerated treatment well    Behavior During Therapy WFL for tasks assessed/performed              Past Medical History:  Diagnosis Date   Anxiety    Arthritis    Asthma    Depression    Hypertension    Pneumonia 2016   Pre-diabetes    Past Surgical History:  Procedure Laterality Date   APPENDECTOMY     CHOLECYSTECTOMY     CHONDROPLASTY Right 10/09/2020   Procedure: CHONDROPLASTY;  Surgeon: Bjorn Pippin, MD;  Location: [facility];  Service: Orthopedics;  Laterality: Right;   KNEE ARTHROSCOPY WITH LATERAL MENISECTOMY Right 10/09/2020   Procedure: KNEE ARTHROSCOPY WITH LATERAL MENISECTOMY;  Surgeon: Bjorn Pippin, MD;  Location: [facility];  Service: Orthopedics;  Laterality: Right;   KNEE ARTHROSCOPY WITH MEDIAL MENISECTOMY Right 10/09/2020   Procedure: KNEE ARTHROSCOPY WITH MEDIAL MENISECTOMY;  Surgeon: Bjorn Pippin, MD;  Location: [facility];  Service: Orthopedics;  Laterality: Right;   KNEE ARTHROSCOPY WITH SUBCHONDROPLASTY Right 10/09/2020   Procedure: KNEE ARTHROSCOPY WITH SUBCHONDROPLASTY;  Surgeon: Bjorn Pippin, MD;  Location: [facility];  Service: Orthopedics;  Laterality: Right;   TOTAL KNEE ARTHROPLASTY Right 05/30/2021   Procedure: TOTAL KNEE ARTHROPLASTY;  Surgeon: Jodi Geralds, MD;  Location: [facility];  Service: Orthopedics;  Laterality: Right;   TUBAL LIGATION     Patient Active Problem List   Diagnosis Date Noted   COPD with chronic bronchitis and emphysema (HCC)  01/25/2023   Effusion of knee joint right 12/09/2020   Primary osteoarthritis of right knee 08/23/2020   Prediabetes 08/23/2020   Essential hypertension 08/23/2020   Anxiety 08/23/2020   Depression 08/23/2020   Asthma 08/23/2020   Hyperlipidemia, mixed 08/23/2020   Post-op pain 05/26/2012   Ruptured appendicitis 05/18/2012    PCP: Christen Butter, NP  REFERRING PROVIDER: Dr Jodi Geralds   REFERRING DIAG: L knee pain   THERAPY DIAG:  Chronic pain of left knee  Other symptoms and signs involving the musculoskeletal system  Muscle weakness (generalized)  Stiffness of left knee, not elsewhere classified  Rationale for Evaluation and Treatment: Rehabilitation  ONSET DATE: 02/05/23  SUBJECTIVE:   SUBJECTIVE STATEMENT: Pain is improving. One day I'm okay and the next day I'm not. It depends how much I'm up on it.  Eval: Patient reports onset of L knee pain ~ 6 months ago with no known injury. She received a cortisone injection ~ 1 week ago which has helped the knee pain but she has has continued pain in the L hamstring. She can't straighten the knee all the way out.   PERTINENT HISTORY: R TKA 2021; arthritis; COPD; HTN; pre-diabetic; anxiety; depression PAIN:  Are you having pain? Yes: NPRS scale: 6/10 Pain location: L hamstrings and calf Pain description: aching  Aggravating factors: working and being up on the leg    Relieving factors: shot; sitting still not  doing anything   PRECAUTIONS: None  RED FLAGS: None   WEIGHT BEARING RESTRICTIONS: No  FALLS:  Has patient fallen in last 6 months? 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 Household chores; yard work; shopping   PLOF: Independent  PATIENT GOALS: get  the pain in the knee and HS gone; get the leg right   NEXT MD VISIT: Christen Butter, NP 10/28/23  OBJECTIVE:   DIAGNOSTIC FINDINGS: none for L knee in epic   PATIENT SURVEYS:  FOTO 31; goal 48   COGNITION: Overall cognitive status: Within functional limits for tasks assessed     SENSATION: WFL's   EDEMA:  Mild to moderate edema L knee   MUSCLE LENGTH: Hamstrings: Right 60 deg; Left 45 deg   POSTURE: rounded shoulders, forward head, flexed trunk , and weight shift right  PALPATION: Muscular tightness in the L hamstrings and calf   LOWER EXTREMITY ROM:  Active ROM Right eval Left eval  Hip flexion [facility] WFL  Hip extension 0 0  Hip abduction    Hip adduction    Hip internal rotation    Hip external rotation    Knee flexion 121 104  Knee extension 0 -17  Ankle dorsiflexion 10 0  Ankle plantarflexion    Ankle inversion    Ankle eversion     (Blank rows = not tested)  LOWER EXTREMITY MMT:  MMT Right eval Left eval  Hip flexion 5 4  Hip extension 4+ 4-  Hip abduction 5 4  Hip adduction    Hip internal rotation    Hip external rotation    Knee flexion 5 4  Knee extension 5 4+  Ankle dorsiflexion    Ankle plantarflexion    Ankle inversion    Ankle eversion     (Blank rows = not tested)  FUNCTIONAL TESTS:  5 times sit to stand: 25.23 seconds - use of bilat UE's wt shifted to R; crepitus L knee last 2 reps   GAIT: Distance walked: 40 ft Assistive device utilized: None Level of assistance: Complete Independence Comments: antalgic gait with L LE in hip and knee flexin; decreased wt bearing L LE in stance phase     [facility] Adult PT Treatment:                                                DATE: 09/20/23 Therapeutic Exercise: Supine HS stretch with strap 2x30 sec Supine ITB stretch with strap 2x30 sec  Terminal knee extension, supine, 10 x 10 sec holds Manual Therapy:  Trigger Point Dry-Needling  Treatment instructions: Expect mild to moderate muscle  soreness. S/S of pneumothorax if dry needled over a lung field, and to seek immediate medical attention should they occur. Patient verbalized understanding of these instructions and education. Patient Consent Given: Yes Education handout provided: Yes Muscles treated: L distal HS and lateral gastroc; distal lateral quad Electrical stimulation performed: No Parameters: N/A Treatment response/outcome: Palpable Increase in Muscle Length  Skilled palpation and monitoring of soft tissues during DN.  STM to L lateral gastroc, hamstrings and distal lateral quad. Seated distraction L  knee joint Passive HS stretch x 1 min Medial glide of patella IASTM with roller to L ITB, quads and gastroc    [facility] Adult PT Treatment:                                                DATE: 09/09/23 Therapeutic Exercise: Manual L hamstring stretch, 90/90 position Manual L calf stretch, supine PROM L knee into extension, L foot on therapists shoulder, hands interlaced at distal femur Terminal knee extension, supine, 10 x 10 sec holds L hamstring stretch on table with ipsilateral pelvic rotation, 3 x 30 sec R foot elevated on step, L heel elevated L knee extension, x 10 reps Manual Therapy: AP/lateral glides of L femur, blocking L tibia, grade III Superior/medial glides of the L patella, grade III External rotation glides of the L tibia, grade III  [facility] Adult PT Treatment:                                                DATE: 09/02/23 Therapeutic Exercise: Supine  Prone Sitting  Standing  Manual Therapy: Trial of DN at next visit  Neuromuscular re-ed: Discussed importance of standing with knee straight as much as possible  Modalities: Cold pack L hamstrings x 10 min  Consider trial of TENS for pain management and muscle relaxation Self Care: Myofacial ball release HS in sitting Massage stick quads; HS; calf  PATIENT EDUCATION:  Education details: POC; HEP  Person educated: Patient Education method:  Explanation, Demonstration, Tactile cues, Verbal cues, and Handouts Education comprehension: verbalized understanding, returned demonstration, verbal cues required, tactile cues required, and needs further education  HOME EXERCISE PROGRAM: Access Code: [email or web address] URL: [email or web address] Date: 09/02/2023 Prepared by: Corlis Leak  Program Notes massage stick  "The Stick"stretch out strap - green strap 4 inch plastic ball  - - sportime all ball  Exercises - Hooklying Hamstring Stretch with Strap  - 2 x daily - 7 x weekly - 1 sets - 3 reps - 30 sec  hold - Supine Calf Stretch with Strap  - 2 x daily - 7 x weekly - 1 sets - 3 reps - 30 sec  hold - Supine Quad Set  - 2 x daily - 7 x weekly - 1 sets - 10 reps - 3 sec  hold - Prone Quadriceps Set  - 2 x daily - 7 x weekly - 1-2 sets - 10 reps - 5-10 sec  hold - Prone Gluteal Sets  - 2 x daily - 7 x weekly - 1 sets - 10 reps - 10 sec  hold - Seated Hamstring Stretch  - 2 x daily - 7 x weekly - 1 sets - 3 reps - 30 sec  hold - Standing Terminal Knee Extension at Wall with Ball  - 2 x daily - 7 x weekly - 1-2 sets - 10 reps - 5-10 sec  hold  Patient Education - Trigger Point Dry Needling  ASSESSMENT:  CLINICAL IMPRESSION: Ronja reports that her pain is improving but she is still limited with ambulation. She continues to lack TKE and has pain anteriorly at the tibial tubercle as well as in the distal HS. Initial trial of DN done to HS and gastroc  today with good elongation of tissues post manual. She has marked tightness in her RF, VI and VLO and would benefit from additional DN/MT here next visit. [place].  Eval: Patient is a 57 y.o. female who was seen today for physical therapy evaluation and treatment for L knee pain with no known injury. She had gradual onset of L knee and hamstring pain ~ 6 months ago. She received a cortisone injection for the L knee ~ 1 week ago with good improvement in the L knee pain. The L hamstring pain has  continued. She also has some pain in the L calf. Patient presents with antalgic gait; significant limitations in L LE ROM; strength; functional activity tolerance. Patient will benefit from PT to address problems identified.   OBJECTIVE IMPAIRMENTS: Abnormal gait, decreased activity tolerance, decreased balance, decreased mobility, difficulty walking, decreased ROM, decreased strength, increased edema, increased fascial restrictions, impaired flexibility, postural dysfunction, and pain.   ACTIVITY LIMITATIONS: carrying, lifting, bending, sitting, standing, squatting, stairs, transfers, and locomotion level  PARTICIPATION LIMITATIONS: meal prep, cleaning, laundry, driving, shopping, community activity, occupation, and yard work  PERSONAL FACTORS: Behavior pattern, Education, Fitness, Past/current experiences, and Time since onset of injury/illness/exacerbation are also affecting patient's functional outcome.   REHAB POTENTIAL: Good  CLINICAL DECISION MAKING: Stable/uncomplicated  EVALUATION COMPLEXITY: Low   GOALS: Goals reviewed with patient? Yes  SHORT TERM GOALS: Target date: 10/14/2023   Independent in initial HEP  Baseline: Goal status: INITIAL  2.  Increase AROM L knee to  0 deg extension and 115 deg flexion  Baseline:  Goal status: INITIAL   LONG TERM GOALS: Target date: 11/25/2023   Improve gait pattern with patient to demonstrate more normal gait pattern with good wt shift and wt bearing L LE in stance to toe off L LE Baseline:  Goal status: INITIAL  2.  Full pain free ROM L knee  Baseline:  Goal status: INITIAL  3.  5/5 strength L LE  Baseline:  Goal status: INITIAL  4.  Patient reports return to all normal functional activities  Baseline:  Goal status: INITIAL  5.  Independent in HEP, including aquatic program as indicated  Baseline:  Goal status: INITIAL  6.  Improve functional limitation score to 48 Baseline:  Goal status: INITIAL   PLAN:  PT  FREQUENCY: 2x/week  PT DURATION: 12 weeks  PLANNED INTERVENTIONS: Therapeutic exercises, Therapeutic activity, Neuromuscular re-education, Balance training, Gait training, Patient/Family education, Self Care, Joint mobilization, Stair training, Aquatic Therapy, Dry Needling, Electrical stimulation, Spinal mobilization, Cryotherapy, Moist heat, Taping, Vasopneumatic device, Ultrasound, Ionotophoresis 4mg /ml Dexamethasone, Manual therapy, and Re-evaluation  PLAN FOR NEXT SESSION: DN to quads, review and progress with exercises; manual work, DN, modalities as indicated    Solon Palm, PT  09/20/2023, 4:33 PM

## 2023-09-20 ENCOUNTER — Encounter: Payer: Self-pay | Admitting: Physical Therapy

## 2023-09-20 ENCOUNTER — Ambulatory Visit: Payer: 59 | Admitting: Physical Therapy

## 2023-09-20 DIAGNOSIS — M25662 Stiffness of left knee, not elsewhere classified: Secondary | ICD-10-CM

## 2023-09-20 DIAGNOSIS — G8929 Other chronic pain: Secondary | ICD-10-CM

## 2023-09-20 DIAGNOSIS — M25562 Pain in left knee: Secondary | ICD-10-CM | POA: Diagnosis not present

## 2023-09-20 DIAGNOSIS — R29898 Other symptoms and signs involving the musculoskeletal system: Secondary | ICD-10-CM

## 2023-09-20 DIAGNOSIS — M6281 Muscle weakness (generalized): Secondary | ICD-10-CM

## 2023-09-22 NOTE — Therapy (Signed)
OUTPATIENT PHYSICAL THERAPY LOWER EXTREMITY TREATMENT   Patient Name: Jasmine Mejia MRN: [id number] DOB:08/08/1966, 57 y.o., female Today's Date: 09/23/2023  END OF SESSION:  PT End of Session - 09/23/23 1537     Visit Number 4    Number of Visits 24    Authorization Type [employer] [id number]    PT Start Time 1537    PT Stop Time 1621    PT Time Calculation (min) 44 min    Activity Tolerance Patient tolerated treatment well    Behavior During Therapy WFL for tasks assessed/performed               Past Medical History:  Diagnosis Date   Anxiety    Arthritis    Asthma    Depression    Hypertension    Pneumonia 2016   Pre-diabetes    Past Surgical History:  Procedure Laterality Date   APPENDECTOMY     CHOLECYSTECTOMY     CHONDROPLASTY Right 10/09/2020   Procedure: CHONDROPLASTY;  Surgeon: Bjorn Pippin, MD;  Location: [facility];  Service: Orthopedics;  Laterality: Right;   KNEE ARTHROSCOPY WITH LATERAL MENISECTOMY Right 10/09/2020   Procedure: KNEE ARTHROSCOPY WITH LATERAL MENISECTOMY;  Surgeon: Bjorn Pippin, MD;  Location: [facility];  Service: Orthopedics;  Laterality: Right;   KNEE ARTHROSCOPY WITH MEDIAL MENISECTOMY Right 10/09/2020   Procedure: KNEE ARTHROSCOPY WITH MEDIAL MENISECTOMY;  Surgeon: Bjorn Pippin, MD;  Location: [facility];  Service: Orthopedics;  Laterality: Right;   KNEE ARTHROSCOPY WITH SUBCHONDROPLASTY Right 10/09/2020   Procedure: KNEE ARTHROSCOPY WITH SUBCHONDROPLASTY;  Surgeon: Bjorn Pippin, MD;  Location: [facility];  Service: Orthopedics;  Laterality: Right;   TOTAL KNEE ARTHROPLASTY Right 05/30/2021   Procedure: TOTAL KNEE ARTHROPLASTY;  Surgeon: Jodi Geralds, MD;  Location: [facility];  Service: Orthopedics;  Laterality: Right;   TUBAL LIGATION     Patient Active Problem List   Diagnosis Date Noted   COPD with chronic bronchitis and emphysema (HCC) 01/25/2023   Effusion of knee joint  right 12/09/2020   Primary osteoarthritis of right knee 08/23/2020   Prediabetes 08/23/2020   Essential hypertension 08/23/2020   Anxiety 08/23/2020   Depression 08/23/2020   Asthma 08/23/2020   Hyperlipidemia, mixed 08/23/2020   Post-op pain 05/26/2012   Ruptured appendicitis 05/18/2012    PCP: Christen Butter, NP  REFERRING PROVIDER: Dr Jodi Geralds   REFERRING DIAG: L knee pain   THERAPY DIAG:  Chronic pain of left knee  Other symptoms and signs involving the musculoskeletal system  Muscle weakness (generalized)  Stiffness of left knee, not elsewhere classified  Rationale for Evaluation and Treatment: Rehabilitation  ONSET DATE: 02/05/23  SUBJECTIVE:   SUBJECTIVE STATEMENT: My heel is really hurting me. It was killing me last night. Worked really hard yesterday. On my feet for 6-7 hours.   Eval: Patient reports onset of L knee pain ~ 6 months ago with no known injury. She received a cortisone injection ~ 1 week ago which has helped the knee pain but she has has continued pain in the L hamstring. She can't straighten the knee all the way out.   PERTINENT HISTORY: R TKA 2021; arthritis; COPD; HTN; pre-diabetic; anxiety; depression PAIN:  Are you having pain? Yes: NPRS scale: 6/10 Pain location: L hamstrings and calf Pain description: aching  Aggravating factors: working and being up on the leg    Relieving factors: shot; sitting still not doing anything   PRECAUTIONS:  None  RED FLAGS: None   WEIGHT BEARING RESTRICTIONS: No  FALLS:  Has patient fallen in last 6 months? 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 Household chores; yard work; shopping   PLOF: Independent  PATIENT GOALS: get the pain in the  knee and HS gone; get the leg right   NEXT MD VISIT: Christen Butter, NP 10/28/23  OBJECTIVE:   DIAGNOSTIC FINDINGS: none for L knee in epic   PATIENT SURVEYS:  FOTO 31; goal 48   COGNITION: Overall cognitive status: Within functional limits for tasks assessed     SENSATION: WFL's   EDEMA:  Mild to moderate edema L knee   MUSCLE LENGTH: Hamstrings: Right 60 deg; Left 45 deg   POSTURE: rounded shoulders, forward head, flexed trunk , and weight shift right  PALPATION: Muscular tightness in the L hamstrings and calf   LOWER EXTREMITY ROM:  Active ROM Right eval Left eval  Hip flexion [facility] WFL  Hip extension 0 0  Hip abduction    Hip adduction    Hip internal rotation    Hip external rotation    Knee flexion 121 104  Knee extension 0 -17  Ankle dorsiflexion 10 0  Ankle plantarflexion    Ankle inversion    Ankle eversion     (Blank rows = not tested)  LOWER EXTREMITY MMT:  MMT Right eval Left eval  Hip flexion 5 4  Hip extension 4+ 4-  Hip abduction 5 4  Hip adduction    Hip internal rotation    Hip external rotation    Knee flexion 5 4  Knee extension 5 4+  Ankle dorsiflexion    Ankle plantarflexion    Ankle inversion    Ankle eversion     (Blank rows = not tested)  FUNCTIONAL TESTS:  5 times sit to stand: 25.23 seconds - use of bilat UE's wt shifted to R; crepitus L knee last 2 reps   GAIT: Distance walked: 40 ft Assistive device utilized: None Level of assistance: Complete Independence Comments: antalgic gait with L LE in hip and knee flexin; decreased wt bearing L LE in stance phase   [facility] Adult PT Treatment:                                                DATE: 09/23/23 Therapeutic Exercise: Nustep L 5 x 2.5 min stopped due to pain Gastroc and soleus stretch L 2x30 sec  Eccentric calf raises B: 3 sec up, 3 sec hold, 3 sec down x 10 Supine HS stretch with strap 2x30 sec Supine ITB stretch with strap 2x30 sec  Prone hip ext x 20 Prone hip ABS  x 20 ea Manual Therapy: IASTM with roller to L gastroc/soleus and hamstrings   Modalities: Iontophoresis with 1.0 mL 4mg /ml Dexamethasone - 4-6 hour patch (20mA-min) to L achilles tendon (patch #1 of 6)   [facility] Adult PT Treatment:  DATE: 09/20/23 Therapeutic Exercise: Supine HS stretch with strap 2x30 sec Supine ITB stretch with strap 2x30 sec  Terminal knee extension, supine, 10 x 10 sec holds Manual Therapy:  Trigger Point Dry-Needling  Treatment instructions: Expect mild to moderate muscle soreness. S/S of pneumothorax if dry needled over a lung field, and to seek immediate medical attention should they occur. Patient verbalized understanding of these instructions and education. Patient Consent Given: Yes Education handout provided: Yes Muscles treated: L distal HS and lateral gastroc; distal lateral quad Electrical stimulation performed: No Parameters: N/A Treatment response/outcome: Palpable Increase in Muscle Length  Skilled palpation and monitoring of soft tissues during DN.  STM to L lateral gastroc, hamstrings and distal lateral quad. Seated distraction L knee joint Passive HS stretch x 1 min Medial glide of patella IASTM with roller to L ITB, quads and gastroc   PATIENT EDUCATION:  Education details: HEP update Person educated: Patient Education method: Explanation, Demonstration, Tactile cues, Verbal cues, and Handouts Education comprehension: verbalized understanding, returned demonstration, verbal cues required, tactile cues required, and needs further education  HOME EXERCISE PROGRAM: Access Code: [email or web address] URL: [email or web address] Date: 09/23/2023 Prepared by: Raynelle Fanning  Program Notes massage stick  "The Stick"stretch out strap - green strap 4 inch plastic ball  - - sportime all ball  Exercises - Hooklying Hamstring Stretch with Strap  - 2 x daily - 7 x weekly - 1 sets - 3 reps - 30 sec  hold - Supine  Calf Stretch with Strap  - 2 x daily - 7 x weekly - 1 sets - 3 reps - 30 sec  hold - Supine Quad Set  - 2 x daily - 7 x weekly - 1 sets - 10 reps - 3 sec  hold - Prone Quadriceps Set  - 2 x daily - 7 x weekly - 1-2 sets - 10 reps - 5-10 sec  hold - Prone Gluteal Sets  - 2 x daily - 7 x weekly - 1 sets - 10 reps - 10 sec  hold - Seated Hamstring Stretch  - 2 x daily - 7 x weekly - 1 sets - 3 reps - 30 sec  hold - Standing Terminal Knee Extension at Wall with Ball  - 2 x daily - 7 x weekly - 1-2 sets - 10 reps - 5-10 sec  hold - Standing Heel Raises  - 1 x daily - 4 x weekly - 3 sets - 10 reps - 3 sec hold  Patient Education - Trigger Point Dry Needling - iontophoresis  ASSESSMENT:  CLINICAL IMPRESSION: Silas reports less knee pain today and increased achilles tendon pain. Her gastroc/soleus is still tender, but no significant trigger points noted. Eccentric heel raises initiated. They did not increase her pain. Also started hip strengthening which can be progressed with resistance next visit. Gait was improved at end of session, but she demonstrated significant deviations walking from lobby due to heel pain. Robby continues to demonstrate potential for improvement and would benefit from continued skilled therapy to address impairments.    Eval: Patient is a 57 y.o. female who was seen today for physical therapy evaluation and treatment for L knee pain with no known injury. She had gradual onset of L knee and hamstring pain ~ 6 months ago. She received a cortisone injection for the L knee ~ 1 week ago with good improvement in the L knee pain. The L hamstring pain has continued. She also has some pain in the L calf. Patient presents  with antalgic gait; significant limitations in L LE ROM; strength; functional activity tolerance. Patient will benefit from PT to address problems identified.   OBJECTIVE IMPAIRMENTS: Abnormal gait, decreased activity tolerance, decreased balance, decreased mobility,  difficulty walking, decreased ROM, decreased strength, increased edema, increased fascial restrictions, impaired flexibility, postural dysfunction, and pain.   ACTIVITY LIMITATIONS: carrying, lifting, bending, sitting, standing, squatting, stairs, transfers, and locomotion level  PARTICIPATION LIMITATIONS: meal prep, cleaning, laundry, driving, shopping, community activity, occupation, and yard work  PERSONAL FACTORS: Behavior pattern, Education, Fitness, Past/current experiences, and Time since onset of injury/illness/exacerbation are also affecting patient's functional outcome.   REHAB POTENTIAL: Good  CLINICAL DECISION MAKING: Stable/uncomplicated  EVALUATION COMPLEXITY: Low   GOALS: Goals reviewed with patient? Yes  SHORT TERM GOALS: Target date: 10/14/2023   Independent in initial HEP  Baseline: Goal status: INITIAL  2.  Increase AROM L knee to  0 deg extension and 115 deg flexion  Baseline:  Goal status: INITIAL   LONG TERM GOALS: Target date: 11/25/2023   Improve gait pattern with patient to demonstrate more normal gait pattern with good wt shift and wt bearing L LE in stance to toe off L LE Baseline:  Goal status: INITIAL  2.  Full pain free ROM L knee  Baseline:  Goal status: INITIAL  3.  5/5 strength L LE  Baseline:  Goal status: INITIAL  4.  Patient reports return to all normal functional activities  Baseline:  Goal status: INITIAL  5.  Independent in HEP, including aquatic program as indicated  Baseline:  Goal status: INITIAL  6.  Improve functional limitation score to 48 Baseline:  Goal status: INITIAL   PLAN:  PT FREQUENCY: 2x/week  PT DURATION: 12 weeks  PLANNED INTERVENTIONS: Therapeutic exercises, Therapeutic activity, Neuromuscular re-education, Balance training, Gait training, Patient/Family education, Self Care, Joint mobilization, Stair training, Aquatic Therapy, Dry Needling, Electrical stimulation, Spinal mobilization, Cryotherapy,  Moist heat, Taping, Vasopneumatic device, Ultrasound, Ionotophoresis 4mg /ml Dexamethasone, Manual therapy, and Re-evaluation  PLAN FOR NEXT SESSION: Assess response to ionto #1, continue with eccentric heel raises, resisted hip strengthening. manual work, DN, modalities as indicated    Solon Palm, PT  09/23/2023, 4:32 PM

## 2023-09-23 ENCOUNTER — Ambulatory Visit: Payer: 59 | Admitting: Physical Therapy

## 2023-09-23 ENCOUNTER — Encounter: Payer: Self-pay | Admitting: Physical Therapy

## 2023-09-23 DIAGNOSIS — M25662 Stiffness of left knee, not elsewhere classified: Secondary | ICD-10-CM

## 2023-09-23 DIAGNOSIS — G8929 Other chronic pain: Secondary | ICD-10-CM

## 2023-09-23 DIAGNOSIS — M6281 Muscle weakness (generalized): Secondary | ICD-10-CM

## 2023-09-23 DIAGNOSIS — R29898 Other symptoms and signs involving the musculoskeletal system: Secondary | ICD-10-CM

## 2023-09-23 DIAGNOSIS — M25562 Pain in left knee: Secondary | ICD-10-CM | POA: Diagnosis not present

## 2023-09-27 ENCOUNTER — Ambulatory Visit: Payer: 59

## 2023-09-27 DIAGNOSIS — M25562 Pain in left knee: Secondary | ICD-10-CM | POA: Diagnosis not present

## 2023-09-27 DIAGNOSIS — G8929 Other chronic pain: Secondary | ICD-10-CM

## 2023-09-27 DIAGNOSIS — R29898 Other symptoms and signs involving the musculoskeletal system: Secondary | ICD-10-CM

## 2023-09-27 DIAGNOSIS — M25662 Stiffness of left knee, not elsewhere classified: Secondary | ICD-10-CM

## 2023-09-27 DIAGNOSIS — M6281 Muscle weakness (generalized): Secondary | ICD-10-CM

## 2023-09-27 NOTE — Therapy (Signed)
OUTPATIENT PHYSICAL THERAPY LOWER EXTREMITY TREATMENT   Patient Name: Jasmine Mejia MRN: [id number] DOB:1966/09/30, 57 y.o., female Today's Date: 09/27/2023  END OF SESSION:  PT End of Session - 09/27/23 1536     Visit Number 5    Number of Visits 24    Date for PT Re-Evaluation 11/25/23    Authorization Type Aetna [id number]    PT Start Time 1536    PT Stop Time 1616    PT Time Calculation (min) 40 min    Activity Tolerance Patient tolerated treatment well    Behavior During Therapy WFL for tasks assessed/performed            Past Medical History:  Diagnosis Date   Anxiety    Arthritis    Asthma    Depression    Hypertension    Pneumonia 2016   Pre-diabetes    Past Surgical History:  Procedure Laterality Date   APPENDECTOMY     CHOLECYSTECTOMY     CHONDROPLASTY Right 10/09/2020   Procedure: CHONDROPLASTY;  Surgeon: Bjorn Pippin, MD;  Location: [facility];  Service: Orthopedics;  Laterality: Right;   KNEE ARTHROSCOPY WITH LATERAL MENISECTOMY Right 10/09/2020   Procedure: KNEE ARTHROSCOPY WITH LATERAL MENISECTOMY;  Surgeon: Bjorn Pippin, MD;  Location: [facility];  Service: Orthopedics;  Laterality: Right;   KNEE ARTHROSCOPY WITH MEDIAL MENISECTOMY Right 10/09/2020   Procedure: KNEE ARTHROSCOPY WITH MEDIAL MENISECTOMY;  Surgeon: Bjorn Pippin, MD;  Location: [facility];  Service: Orthopedics;  Laterality: Right;   KNEE ARTHROSCOPY WITH SUBCHONDROPLASTY Right 10/09/2020   Procedure: KNEE ARTHROSCOPY WITH SUBCHONDROPLASTY;  Surgeon: Bjorn Pippin, MD;  Location: [facility];  Service: Orthopedics;  Laterality: Right;   TOTAL KNEE ARTHROPLASTY Right 05/30/2021   Procedure: TOTAL KNEE ARTHROPLASTY;  Surgeon: Jodi Geralds, MD;  Location: [facility];  Service: Orthopedics;  Laterality: Right;   TUBAL LIGATION     Patient Active Problem List   Diagnosis Date Noted   COPD with chronic bronchitis and emphysema (HCC)  01/25/2023   Effusion of knee joint right 12/09/2020   Primary osteoarthritis of right knee 08/23/2020   Prediabetes 08/23/2020   Essential hypertension 08/23/2020   Anxiety 08/23/2020   Depression 08/23/2020   Asthma 08/23/2020   Hyperlipidemia, mixed 08/23/2020   Post-op pain 05/26/2012   Ruptured appendicitis 05/18/2012    PCP: Christen Butter, NP  REFERRING PROVIDER: Dr Jodi Geralds   REFERRING DIAG: L knee pain   THERAPY DIAG:  Chronic pain of left knee  Other symptoms and signs involving the musculoskeletal system  Muscle weakness (generalized)  Stiffness of left knee, not elsewhere classified  Rationale for Evaluation and Treatment: Rehabilitation  ONSET DATE: 02/05/23  SUBJECTIVE:   SUBJECTIVE STATEMENT: Patient reports 5/10 pain that radiates from behind R knee down into heel; states she did not have to work today, which helps with less pain.   Eval: Patient reports onset of L knee pain ~ 6 months ago with no known injury. She received a cortisone injection ~ 1 week ago which has helped the knee pain but she has has continued pain in the L hamstring. She can't straighten the knee all the way out.   PERTINENT HISTORY: R TKA 2021; arthritis; COPD; HTN; pre-diabetic; anxiety; depression PAIN:  Are you having pain? Yes: NPRS scale: 6/10 Pain location: L hamstrings and calf Pain description: aching  Aggravating factors: working and being up on the leg    Relieving factors:  shot; sitting still not doing anything   PRECAUTIONS: None  RED FLAGS: None   WEIGHT BEARING RESTRICTIONS: No  FALLS:  Has patient fallen in last 6 months? 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 Household chores; yard work;  shopping   PLOF: Independent  PATIENT GOALS: get the pain in the knee and HS gone; get the leg right   NEXT MD VISIT: Christen Butter, NP 10/28/23  OBJECTIVE:   DIAGNOSTIC FINDINGS: none for L knee in epic   PATIENT SURVEYS:  FOTO 31; goal 48   COGNITION: Overall cognitive status: Within functional limits for tasks assessed     SENSATION: WFL's   EDEMA:  Mild to moderate edema L knee   MUSCLE LENGTH: Hamstrings: Right 60 deg; Left 45 deg   POSTURE: rounded shoulders, forward head, flexed trunk , and weight shift right  PALPATION: Muscular tightness in the L hamstrings and calf   LOWER EXTREMITY ROM:  Active ROM Right eval Left eval  Hip flexion [facility] WFL  Hip extension 0 0  Hip abduction    Hip adduction    Hip internal rotation    Hip external rotation    Knee flexion 121 104  Knee extension 0 -17  Ankle dorsiflexion 10 0  Ankle plantarflexion    Ankle inversion    Ankle eversion     (Blank rows = not tested)  LOWER EXTREMITY MMT:  MMT Right eval Left eval  Hip flexion 5 4  Hip extension 4+ 4-  Hip abduction 5 4  Hip adduction    Hip internal rotation    Hip external rotation    Knee flexion 5 4  Knee extension 5 4+  Ankle dorsiflexion    Ankle plantarflexion    Ankle inversion    Ankle eversion     (Blank rows = not tested)  FUNCTIONAL TESTS:  5 times sit to stand: 25.23 seconds - use of bilat UE's wt shifted to R; crepitus L knee last 2 reps   GAIT: Distance walked: 40 ft Assistive device utilized: None Level of assistance: Complete Independence Comments: antalgic gait with L LE in hip and knee flexin; decreased wt bearing L LE in stance phase    [facility] Adult PT Treatment:                                                DATE: 09/27/2023 Therapeutic Exercise: Nustep L5 x 5 min 1/2 long sitting gastroc stretch 2x30" (L) Standing soleus stretch 2x30" (L) Eccentric calf raises B: 3 sec up, 3 sec hold, 3 sec down x 10 Standing:  Hip ext  (elbow prop on counter) + YTB at ankles x20 L TKE (back against wall) +GTB 15x5" Resisted side stepping + RTB crossed at ankles Manual Therapy: IASTM (L) gastroc/soleus  Modalities: Iontophoresis with 1.0 mL 4mg /ml Dexamethasone - 4-6 hour patch (27mA-min) to L achilles tendon (patch #2 of 6)    [facility] Adult PT Treatment:  DATE: 09/23/23 Therapeutic Exercise: Nustep L 5 x 2.5 min stopped due to pain Gastroc and soleus stretch L 2x30 sec  Eccentric calf raises B: 3 sec up, 3 sec hold, 3 sec down x 10 Supine HS stretch with strap 2x30 sec Supine ITB stretch with strap 2x30 sec  Prone hip ext x 20 Prone hip ABS x 20 ea Manual Therapy: IASTM with roller to L gastroc/soleus and hamstrings   Modalities: Iontophoresis with 1.0 mL 4mg /ml Dexamethasone - 4-6 hour patch (43mA-min) to L achilles tendon (patch #1 of 6)   [facility] Adult PT Treatment:                                                DATE: 09/20/23 Therapeutic Exercise: Supine HS stretch with strap 2x30 sec Supine ITB stretch with strap 2x30 sec  Terminal knee extension, supine, 10 x 10 sec holds Manual Therapy:  Trigger Point Dry-Needling  Treatment instructions: Expect mild to moderate muscle soreness. S/S of pneumothorax if dry needled over a lung field, and to seek immediate medical attention should they occur. Patient verbalized understanding of these instructions and education. Patient Consent Given: Yes Education handout provided: Yes Muscles treated: L distal HS and lateral gastroc; distal lateral quad Electrical stimulation performed: No Parameters: N/A Treatment response/outcome: Palpable Increase in Muscle Length  Skilled palpation and monitoring of soft tissues during DN.  STM to L lateral gastroc, hamstrings and distal lateral quad. Seated distraction L knee joint Passive HS stretch x 1 min Medial glide of patella IASTM with roller to L ITB, quads and  gastroc   PATIENT EDUCATION:  Education details: HEP update Person educated: Patient Education method: Explanation, Demonstration, Tactile cues, Verbal cues, and Handouts Education comprehension: verbalized understanding, returned demonstration, verbal cues required, tactile cues required, and needs further education  HOME EXERCISE PROGRAM: Access Code: [email or web address] URL: [email or web address] Date: 09/23/2023 Prepared by: Raynelle Fanning  Program Notes massage stick  "The Stick"stretch out strap - green strap 4 inch plastic ball  - - sportime all ball  Exercises - Hooklying Hamstring Stretch with Strap  - 2 x daily - 7 x weekly - 1 sets - 3 reps - 30 sec  hold - Supine Calf Stretch with Strap  - 2 x daily - 7 x weekly - 1 sets - 3 reps - 30 sec  hold - Supine Quad Set  - 2 x daily - 7 x weekly - 1 sets - 10 reps - 3 sec  hold - Prone Quadriceps Set  - 2 x daily - 7 x weekly - 1-2 sets - 10 reps - 5-10 sec  hold - Prone Gluteal Sets  - 2 x daily - 7 x weekly - 1 sets - 10 reps - 10 sec  hold - Seated Hamstring Stretch  - 2 x daily - 7 x weekly - 1 sets - 3 reps - 30 sec  hold - Standing Terminal Knee Extension at Wall with Ball  - 2 x daily - 7 x weekly - 1-2 sets - 10 reps - 5-10 sec  hold - Standing Heel Raises  - 1 x daily - 4 x weekly - 3 sets - 10 reps - 3 sec hold  Patient Education - Trigger Point Dry Needling - iontophoresis  ASSESSMENT:  CLINICAL IMPRESSION: Progressed hip strengthening with added resistance during standing exercises. Greater tightness noted  at medial gastroc vs lateral during manual treatment; patient reported decreased discomfort when walking after treatment, however continues to exhibit antalgic gait.  Eval: Patient is a 57 y.o. female who was seen today for physical therapy evaluation and treatment for L knee pain with no known injury. She had gradual onset of L knee and hamstring pain ~ 6 months ago. She received a cortisone injection for the L knee  ~ 1 week ago with good improvement in the L knee pain. The L hamstring pain has continued. She also has some pain in the L calf. Patient presents with antalgic gait; significant limitations in L LE ROM; strength; functional activity tolerance. Patient will benefit from PT to address problems identified.   OBJECTIVE IMPAIRMENTS: Abnormal gait, decreased activity tolerance, decreased balance, decreased mobility, difficulty walking, decreased ROM, decreased strength, increased edema, increased fascial restrictions, impaired flexibility, postural dysfunction, and pain.   ACTIVITY LIMITATIONS: carrying, lifting, bending, sitting, standing, squatting, stairs, transfers, and locomotion level  PARTICIPATION LIMITATIONS: meal prep, cleaning, laundry, driving, shopping, community activity, occupation, and yard work  PERSONAL FACTORS: Behavior pattern, Education, Fitness, Past/current experiences, and Time since onset of injury/illness/exacerbation are also affecting patient's functional outcome.   REHAB POTENTIAL: Good  CLINICAL DECISION MAKING: Stable/uncomplicated  EVALUATION COMPLEXITY: Low   GOALS: Goals reviewed with patient? Yes  SHORT TERM GOALS: Target date: 10/14/2023  Independent in initial HEP  Baseline: Goal status: INITIAL  2.  Increase AROM L knee to  0 deg extension and 115 deg flexion  Baseline:  Goal status: INITIAL   LONG TERM GOALS: Target date: 11/25/2023  Improve gait pattern with patient to demonstrate more normal gait pattern with good wt shift and wt bearing L LE in stance to toe off L LE Baseline:  Goal status: INITIAL  2.  Full pain free ROM L knee  Baseline:  Goal status: INITIAL  3.  5/5 strength L LE  Baseline:  Goal status: INITIAL  4.  Patient reports return to all normal functional activities  Baseline:  Goal status: INITIAL  5.  Independent in HEP, including aquatic program as indicated  Baseline:  Goal status: INITIAL  6.  Improve functional  limitation score to 48 Baseline:  Goal status: INITIAL   PLAN:  PT FREQUENCY: 2x/week  PT DURATION: 12 weeks  PLANNED INTERVENTIONS: Therapeutic exercises, Therapeutic activity, Neuromuscular re-education, Balance training, Gait training, Patient/Family education, Self Care, Joint mobilization, Stair training, Aquatic Therapy, Dry Needling, Electrical stimulation, Spinal mobilization, Cryotherapy, Moist heat, Taping, Vasopneumatic device, Ultrasound, Ionotophoresis 4mg /ml Dexamethasone, Manual therapy, and Re-evaluation  PLAN FOR NEXT SESSION: Continue with eccentric heel raises, pogress resisted hip strengthening. Manual work, DN, modalities as indicated; continues ionto (6 patches)   Carlynn Herald, PTA  09/27/2023, 4:16 PM

## 2023-09-30 ENCOUNTER — Ambulatory Visit: Payer: 59

## 2023-09-30 DIAGNOSIS — M25662 Stiffness of left knee, not elsewhere classified: Secondary | ICD-10-CM

## 2023-09-30 DIAGNOSIS — M6281 Muscle weakness (generalized): Secondary | ICD-10-CM

## 2023-09-30 DIAGNOSIS — G8929 Other chronic pain: Secondary | ICD-10-CM

## 2023-09-30 DIAGNOSIS — R29898 Other symptoms and signs involving the musculoskeletal system: Secondary | ICD-10-CM

## 2023-09-30 DIAGNOSIS — M25562 Pain in left knee: Secondary | ICD-10-CM | POA: Diagnosis not present

## 2023-09-30 NOTE — Therapy (Signed)
OUTPATIENT PHYSICAL THERAPY LOWER EXTREMITY TREATMENT   Patient Name: Jasmine Mejia MRN: [id number] DOB:[birth year], 57 y.o., female Today's Date: 09/30/2023  END OF SESSION:  PT End of Session - 09/30/23 1537     Visit Number 6    Number of Visits 24    Date for PT Re-Evaluation 11/25/23    Authorization Type [id number]    PT Start Time 1538    PT Stop Time 1622    PT Time Calculation (min) 44 min    Activity Tolerance Patient tolerated treatment well    Behavior During Therapy WFL for tasks assessed/performed            Past Medical History:  Diagnosis Date   Anxiety    Arthritis    Asthma    Depression    Hypertension    Pneumonia 2016   Pre-diabetes    Past Surgical History:  Procedure Laterality Date   APPENDECTOMY     CHOLECYSTECTOMY     CHONDROPLASTY Right 10/09/2020   Procedure: CHONDROPLASTY;  Surgeon: Bjorn Pippin, MD;  Location: [facility];  Service: Orthopedics;  Laterality: Right;   KNEE ARTHROSCOPY WITH LATERAL MENISECTOMY Right 10/09/2020   Procedure: KNEE ARTHROSCOPY WITH LATERAL MENISECTOMY;  Surgeon: Bjorn Pippin, MD;  Location: [facility];  Service: Orthopedics;  Laterality: Right;   KNEE ARTHROSCOPY WITH MEDIAL MENISECTOMY Right 10/09/2020   Procedure: KNEE ARTHROSCOPY WITH MEDIAL MENISECTOMY;  Surgeon: Bjorn Pippin, MD;  Location: [facility];  Service: Orthopedics;  Laterality: Right;   KNEE ARTHROSCOPY WITH SUBCHONDROPLASTY Right 10/09/2020   Procedure: KNEE ARTHROSCOPY WITH SUBCHONDROPLASTY;  Surgeon: Bjorn Pippin, MD;  Location: [facility];  Service: Orthopedics;  Laterality: Right;   TOTAL KNEE ARTHROPLASTY Right 05/30/2021   Procedure: TOTAL KNEE ARTHROPLASTY;  Surgeon: Jodi Geralds, MD;  Location: [facility];  Service: Orthopedics;  Laterality: Right;   TUBAL LIGATION     Patient Active Problem List   Diagnosis Date Noted   COPD with chronic bronchitis and emphysema (HCC)  01/25/2023   Effusion of knee joint right 12/09/2020   Primary osteoarthritis of right knee 08/23/2020   Prediabetes 08/23/2020   Essential hypertension 08/23/2020   Anxiety 08/23/2020   Depression 08/23/2020   Asthma 08/23/2020   Hyperlipidemia, mixed 08/23/2020   Post-op pain 05/26/2012   Ruptured appendicitis 05/18/2012    PCP: Christen Butter, NP  REFERRING PROVIDER: Dr Jodi Geralds   REFERRING DIAG: L knee pain   THERAPY DIAG:  Chronic pain of left knee  Other symptoms and signs involving the musculoskeletal system  Muscle weakness (generalized)  Stiffness of left knee, not elsewhere classified  Rationale for Evaluation and Treatment: Rehabilitation  ONSET DATE: 02/05/23  SUBJECTIVE:   SUBJECTIVE STATEMENT: Patient reports the ionto patches have been helping with pain; states she was feeling better but then wore different sneakers to work and the back of her ankle flared up. Patient states she does her stretches during work breaks and at the end of the day. Patient states her knee has not been bothering her, states calf and ankle are her main complaints.  Eval: Patient reports onset of L knee pain ~ 6 months ago with no known injury. She received a cortisone injection ~ 1 week ago which has helped the knee pain but she has has continued pain in the L hamstring. She can't straighten the knee all the way out.   PERTINENT HISTORY: R TKA 2021; arthritis; COPD; HTN; pre-diabetic;  anxiety; depression PAIN:  Are you having pain? Yes: NPRS scale: 6/10 Pain location: L hamstrings and calf Pain description: aching  Aggravating factors: working and being up on the leg    Relieving factors: shot; sitting still not doing anything   PRECAUTIONS: None  RED FLAGS: None   WEIGHT BEARING RESTRICTIONS: No  FALLS:  Has patient fallen in last 6 months? 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 Household chores; yard work; shopping   PLOF: Independent  PATIENT GOALS: get the pain in the knee and HS gone; get the leg right   NEXT MD VISIT: Christen Butter, NP 10/28/23  OBJECTIVE:   DIAGNOSTIC FINDINGS: none for L knee in epic   PATIENT SURVEYS:  FOTO 31; goal 48   COGNITION: Overall cognitive status: Within functional limits for tasks assessed     SENSATION: WFL's   EDEMA:  Mild to moderate edema L knee   MUSCLE LENGTH: Hamstrings: Right 60 deg; Left 45 deg   POSTURE: rounded shoulders, forward head, flexed trunk , and weight shift right  PALPATION: Muscular tightness in the L hamstrings and calf   LOWER EXTREMITY ROM:  Active ROM Right eval Left eval  Hip flexion [facility] WFL  Hip extension 0 0  Hip abduction    Hip adduction    Hip internal rotation    Hip external rotation    Knee flexion 121 104  Knee extension 0 -17  Ankle dorsiflexion 10 0  Ankle plantarflexion    Ankle inversion    Ankle eversion     (Blank rows = not tested)  LOWER EXTREMITY MMT:  MMT Right eval Left eval  Hip flexion 5 4  Hip extension 4+ 4-  Hip abduction 5 4  Hip adduction    Hip internal rotation    Hip external rotation    Knee flexion 5 4  Knee extension 5 4+  Ankle dorsiflexion    Ankle plantarflexion    Ankle inversion    Ankle eversion     (Blank rows = not tested)  FUNCTIONAL TESTS:  5 times sit to stand: 25.23 seconds - use of bilat UE's wt shifted to R; crepitus L knee last 2 reps   GAIT: Distance walked: 40 ft Assistive device utilized: None Level of assistance: Complete Independence Comments: antalgic gait with L LE in hip and knee flexin; decreased wt bearing L LE in stance phase    [facility] Adult PT Treatment:                                                DATE:  09/30/2023 Therapeutic Exercise: Eccentric heel raises (up bilateral heels --> down L SLS) (L) gastroc stretch on 4" step 2x30" Soleus stretch on slant board 2x30" Resisted side stepping (counter) + GTB crossed at ankles x 5 passes Bent over hip extension + YTB crossed at ankle 2x10 (B) Standing HS curls + YTB crossed at ankle x12 (B) Prone quad set (L) 10x5" Prone quad stretch w/strap 3x30" Supine HS/ITB stretches w/strap 2x30" Seated gastroc/soleus stretches with strap 3x10" each Modalities: Iontophoresis with 1.0 mL  4mg /ml Dexamethasone - 4-6 hour patch (29mA-min) to L achilles tendon (patch #3 of 6)    [facility] Adult PT Treatment:                                                DATE: 09/27/2023 Therapeutic Exercise: Nustep L5 x 5 min 1/2 long sitting gastroc stretch 2x30" (L) Standing soleus stretch 2x30" (L) Eccentric calf raises B: 3 sec up, 3 sec hold, 3 sec down x 10 Standing:  Hip ext (elbow prop on counter) + YTB at ankles x20 L TKE (back against wall) +GTB 15x5" Resisted side stepping + RTB crossed at ankles Manual Therapy: IASTM (L) gastroc/soleus  Modalities: Iontophoresis with 1.0 mL 4mg /ml Dexamethasone - 4-6 hour patch (22mA-min) to L achilles tendon (patch #2 of 6)    [facility] Adult PT Treatment:                                                DATE: 09/20/23 Therapeutic Exercise: Supine HS stretch with strap 2x30 sec Supine ITB stretch with strap 2x30 sec  Terminal knee extension, supine, 10 x 10 sec holds Manual Therapy:  Trigger Point Dry-Needling  Treatment instructions: Expect mild to moderate muscle soreness. S/S of pneumothorax if dry needled over a lung field, and to seek immediate medical attention should they occur. Patient verbalized understanding of these instructions and education. Patient Consent Given: Yes Education handout provided: Yes Muscles treated: L distal HS and lateral gastroc; distal lateral quad Electrical stimulation performed:  No Parameters: N/A Treatment response/outcome: Palpable Increase in Muscle Length  Skilled palpation and monitoring of soft tissues during DN.  STM to L lateral gastroc, hamstrings and distal lateral quad. Seated distraction L knee joint Passive HS stretch x 1 min Medial glide of patella IASTM with roller to L ITB, quads and gastroc   PATIENT EDUCATION:  Education details: HEP update Person educated: Patient Education method: Explanation, Demonstration, Tactile cues, Verbal cues, and Handouts Education comprehension: verbalized understanding, returned demonstration, verbal cues required, tactile cues required, and needs further education  HOME EXERCISE PROGRAM: Access Code: [email or web address] URL: [email or web address] Date: 09/30/2023 Prepared by: Carlynn Herald  Program Notes massage stick  "The Stick"stretch out strap - green strap 4 inch plastic ball  - - sportime all ball  Exercises - Hooklying Hamstring Stretch with Strap  - 2 x daily - 7 x weekly - 1 sets - 3 reps - 30 sec  hold - Prone Quadriceps Set  - 2 x daily - 7 x weekly - 1-2 sets - 10 reps - 5-10 sec  hold - Seated Hamstring Stretch  - 2 x daily - 7 x weekly - 1 sets - 3 reps - 30 sec  hold - Standing Terminal Knee Extension at Wall with Ball  - 2 x daily - 7 x weekly - 1-2 sets - 10 reps - 5-10 sec  hold - Standing Heel Raises  - 1 x daily - 4 x weekly - 3 sets - 10 reps - 3 sec hold - Seated Gastroc Stretch with Strap  - 2 x daily - 7 x weekly - 1 sets - 3-5 reps - 20-30 sec hold - Seated Soleus Stretch with Strap  - 2 x  daily - 7 x weekly - 1 sets - 3-5 reps - 20-30 sec hold - Side Stepping with Resistance at Ankles and Counter Support  - 1 x daily - 7 x weekly - 3 sets - 10 reps  Patient Education - Trigger Point Dry Needling  ASSESSMENT:  CLINICAL IMPRESSION: Hip strengthening continued with added resistance. Patient provided with seated stretches to address gastroc/soleus tightness. Cueing improved  mechanics with eccentric heel raises.   Eval: Patient is a 57 y.o. female who was seen today for physical therapy evaluation and treatment for L knee pain with no known injury. She had gradual onset of L knee and hamstring pain ~ 6 months ago. She received a cortisone injection for the L knee ~ 1 week ago with good improvement in the L knee pain. The L hamstring pain has continued. She also has some pain in the L calf. Patient presents with antalgic gait; significant limitations in L LE ROM; strength; functional activity tolerance. Patient will benefit from PT to address problems identified.   OBJECTIVE IMPAIRMENTS: Abnormal gait, decreased activity tolerance, decreased balance, decreased mobility, difficulty walking, decreased ROM, decreased strength, increased edema, increased fascial restrictions, impaired flexibility, postural dysfunction, and pain.   ACTIVITY LIMITATIONS: carrying, lifting, bending, sitting, standing, squatting, stairs, transfers, and locomotion level  PARTICIPATION LIMITATIONS: meal prep, cleaning, laundry, driving, shopping, community activity, occupation, and yard work  PERSONAL FACTORS: Behavior pattern, Education, Fitness, Past/current experiences, and Time since onset of injury/illness/exacerbation are also affecting patient's functional outcome.   REHAB POTENTIAL: Good  CLINICAL DECISION MAKING: Stable/uncomplicated  EVALUATION COMPLEXITY: Low   GOALS: Goals reviewed with patient? Yes  SHORT TERM GOALS: Target date: 10/14/2023  Independent in initial HEP  Baseline: Goal status: INITIAL  2.  Increase AROM L knee to  0 deg extension and 115 deg flexion  Baseline:  Goal status: INITIAL   LONG TERM GOALS: Target date: 11/25/2023  Improve gait pattern with patient to demonstrate more normal gait pattern with good wt shift and wt bearing L LE in stance to toe off L LE Baseline:  Goal status: INITIAL  2.  Full pain free ROM L knee  Baseline:  Goal status:  INITIAL  3.  5/5 strength L LE  Baseline:  Goal status: INITIAL  4.  Patient reports return to all normal functional activities  Baseline:  Goal status: INITIAL  5.  Independent in HEP, including aquatic program as indicated  Baseline:  Goal status: INITIAL  6.  Improve functional limitation score to 48 Baseline:  Goal status: INITIAL   PLAN:  PT FREQUENCY: 2x/week  PT DURATION: 12 weeks  PLANNED INTERVENTIONS: Therapeutic exercises, Therapeutic activity, Neuromuscular re-education, Balance training, Gait training, Patient/Family education, Self Care, Joint mobilization, Stair training, Aquatic Therapy, Dry Needling, Electrical stimulation, Spinal mobilization, Cryotherapy, Moist heat, Taping, Vasopneumatic device, Ultrasound, Ionotophoresis 4mg /ml Dexamethasone, Manual therapy, and Re-evaluation  PLAN FOR NEXT SESSION: Continue with eccentric heel raises, pogress resisted hip strengthening. Manual work, DN, modalities as indicated; continue ionto (6 patches)   Carlynn Herald, PTA  09/30/2023, 4:25 PM

## 2023-10-05 ENCOUNTER — Ambulatory Visit: Payer: 59 | Admitting: Rehabilitative and Restorative Service Providers"

## 2023-10-07 ENCOUNTER — Ambulatory Visit: Payer: 59

## 2023-10-07 DIAGNOSIS — G8929 Other chronic pain: Secondary | ICD-10-CM

## 2023-10-07 DIAGNOSIS — M25562 Pain in left knee: Secondary | ICD-10-CM | POA: Diagnosis not present

## 2023-10-07 DIAGNOSIS — M25662 Stiffness of left knee, not elsewhere classified: Secondary | ICD-10-CM

## 2023-10-07 DIAGNOSIS — M6281 Muscle weakness (generalized): Secondary | ICD-10-CM

## 2023-10-07 DIAGNOSIS — R29898 Other symptoms and signs involving the musculoskeletal system: Secondary | ICD-10-CM

## 2023-10-07 NOTE — Therapy (Addendum)
 OUTPATIENT PHYSICAL THERAPY LOWER EXTREMITY TREATMENT PHYSICAL THERAPY DISCHARGE SUMMARY  Visits from Start of Care: 7  Current functional level related to goals / functional outcomes: See goals below   Remaining deficits: Status unknown   Education / Equipment: N/A   Patient agrees to discharge. Patient goals were partially met. Patient is being discharged due to not returning since the last visit.  Patient Name: Jasmine Mejia MRN: [id number] DOB:[birth year], 57 y.o., female Today's Date: 10/07/2023  END OF SESSION:  PT End of Session - 10/07/23 1621     Visit Number 7    Number of Visits 24    Date for PT Re-Evaluation 11/25/23    Authorization Type [employer] [id number]    PT Start Time 1621    PT Stop Time 1700    PT Time Calculation (min) 39 min    Activity Tolerance Patient tolerated treatment well    Behavior During Therapy WFL for tasks assessed/performed             Past Medical History:  Diagnosis Date   Anxiety    Arthritis    Asthma    Depression    Hypertension    Pneumonia 2016   Pre-diabetes    Past Surgical History:  Procedure Laterality Date   APPENDECTOMY     CHOLECYSTECTOMY     CHONDROPLASTY Right 10/09/2020   Procedure: CHONDROPLASTY;  Surgeon: Bjorn Pippin, MD;  Location: [facility];  Service: Orthopedics;  Laterality: Right;   KNEE ARTHROSCOPY WITH LATERAL MENISECTOMY Right 10/09/2020   Procedure: KNEE ARTHROSCOPY WITH LATERAL MENISECTOMY;  Surgeon: Bjorn Pippin, MD;  Location: [facility];  Service: Orthopedics;  Laterality: Right;   KNEE ARTHROSCOPY WITH MEDIAL MENISECTOMY Right 10/09/2020   Procedure: KNEE ARTHROSCOPY WITH MEDIAL MENISECTOMY;  Surgeon: Bjorn Pippin, MD;  Location: [facility];  Service: Orthopedics;  Laterality: Right;   KNEE ARTHROSCOPY WITH SUBCHONDROPLASTY Right 10/09/2020   Procedure: KNEE ARTHROSCOPY WITH SUBCHONDROPLASTY;  Surgeon: Bjorn Pippin, MD;  Location: [facility];  Service: Orthopedics;  Laterality: Right;   TOTAL KNEE ARTHROPLASTY Right 05/30/2021   Procedure: TOTAL KNEE ARTHROPLASTY;  Surgeon: Jodi Geralds, MD;  Location: [facility];  Service: Orthopedics;  Laterality: Right;   TUBAL LIGATION     Patient Active Problem List   Diagnosis Date Noted   COPD with chronic bronchitis and emphysema (HCC) 01/25/2023   Effusion of knee joint right 12/09/2020   Primary osteoarthritis of right knee 08/23/2020   Prediabetes 08/23/2020   Essential hypertension 08/23/2020   Anxiety 08/23/2020   Depression 08/23/2020   Asthma 08/23/2020   Hyperlipidemia, mixed 08/23/2020   Post-op pain 05/26/2012   Ruptured appendicitis 05/18/2012    PCP: Christen Butter, NP  REFERRING PROVIDER: Dr Jodi Geralds   REFERRING DIAG: L knee pain   THERAPY DIAG:  Chronic pain of left knee  Other symptoms and signs involving the musculoskeletal system  Muscle weakness (generalized)  Stiffness of left knee, not elsewhere classified  Rationale for Evaluation and Treatment: Rehabilitation  ONSET DATE: 02/05/23  SUBJECTIVE:   SUBJECTIVE STATEMENT: "I'm feeling alright. It's just the ankle is the only thing that is bothering me. The patch helps."  Eval: Patient reports onset of L knee pain ~ 6 months ago with no known injury. She received a cortisone injection ~ 1 week ago which has helped the knee pain but she has has continued pain in the L hamstring. She can't straighten the knee all  the way out.   PERTINENT HISTORY: R TKA 2021; arthritis; COPD; HTN; pre-diabetic; anxiety; depression PAIN:  Are you having pain? Yes: NPRS scale: 6/10 Pain location: Lt calf/achilles tendon  Pain description: aching  Aggravating factors: working and being up on the leg    Relieving factors: shot; sitting still not doing anything   PRECAUTIONS: None  RED FLAGS: None   WEIGHT BEARING RESTRICTIONS: No  FALLS:  Has patient fallen in last 6 months? 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 Household chores; yard work; shopping   PLOF: Independent  PATIENT GOALS: get the pain in the knee and HS gone; get the leg right   NEXT MD VISIT: Christen Butter, NP 10/28/23  OBJECTIVE:   DIAGNOSTIC FINDINGS: none for L knee in epic   PATIENT SURVEYS:  FOTO 31; goal 48   COGNITION: Overall cognitive status: Within functional limits for tasks assessed     SENSATION: WFL's   EDEMA:  Mild to moderate edema L knee   MUSCLE LENGTH: Hamstrings: Right 60 deg; Left 45 deg   POSTURE: rounded shoulders, forward head, flexed trunk , and weight shift right  PALPATION: Muscular tightness in the L hamstrings and calf   LOWER EXTREMITY ROM:  Active ROM Right eval Left eval 10/07/23 Left   Hip flexion [facility] WFL   Hip extension 0 0   Hip abduction     Hip adduction     Hip internal rotation     Hip external rotation     Knee flexion 121 104   Knee extension 0 -17 Lacking 14   Ankle dorsiflexion 10 0   Ankle plantarflexion     Ankle inversion     Ankle eversion      (Blank rows = not tested)  LOWER EXTREMITY MMT:  MMT Right eval Left eval  Hip flexion 5 4  Hip extension 4+ 4-  Hip abduction 5 4  Hip adduction    Hip internal rotation    Hip external rotation    Knee flexion 5 4  Knee extension 5 4+  Ankle dorsiflexion    Ankle plantarflexion    Ankle inversion    Ankle eversion     (Blank rows = not tested)  FUNCTIONAL TESTS:  5 times sit to stand: 25.23 seconds - use of bilat UE's wt shifted to R; crepitus L knee last 2 reps   GAIT: Distance walked: 40 ft Assistive device utilized: None Level of assistance: Complete Independence Comments: antalgic gait with L LE in  hip and knee flexin; decreased wt bearing L LE in stance phase   [facility] Adult PT Treatment:                                                DATE: 10/07/23 Therapeutic Exercise: NuStep level 5 x 5 minutes UE/LE  SLR 2 x 10  Sidelying hip abduction 2 x 10  Hip bridge 2 x 10  LAQ 2 x 10  TKE blue ball 2 x 10  Calf stretch on wedge x 60 seconds  Leg press 2 x 10 @ 45  lbs  Sustained heel prop with weighted cuff at knee to encourage knee extension x 5 minutes   Modalities: Iontophoresis with 1.0 mL 4mg /ml Dexamethasone - 4-6 hour patch (22mA-min) to L achilles tendon (patch #4 of 6)    [facility] Adult PT Treatment:                                                DATE: 09/30/2023 Therapeutic Exercise: Eccentric heel raises (up bilateral heels --> down L SLS) (L) gastroc stretch on 4" step 2x30" Soleus stretch on slant board 2x30" Resisted side stepping (counter) + GTB crossed at ankles x 5 passes Bent over hip extension + YTB crossed at ankle 2x10 (B) Standing HS curls + YTB crossed at ankle x12 (B) Prone quad set (L) 10x5" Prone quad stretch w/strap 3x30" Supine HS/ITB stretches w/strap 2x30" Seated gastroc/soleus stretches with strap 3x10" each Modalities: Iontophoresis with 1.0 mL 4mg /ml Dexamethasone - 4-6 hour patch (50mA-min) to L achilles tendon (patch #3 of 6)    [facility] Adult PT Treatment:                                                DATE: 09/27/2023 Therapeutic Exercise: Nustep L5 x 5 min 1/2 long sitting gastroc stretch 2x30" (L) Standing soleus stretch 2x30" (L) Eccentric calf raises B: 3 sec up, 3 sec hold, 3 sec down x 10 Standing:  Hip ext (elbow prop on counter) + YTB at ankles x20 L TKE (back against wall) +GTB 15x5" Resisted side stepping + RTB crossed at ankles Manual Therapy: IASTM (L) gastroc/soleus  Modalities: Iontophoresis with 1.0 mL 4mg /ml Dexamethasone - 4-6 hour patch (20mA-min) to L achilles tendon (patch #2 of 6)    PATIENT EDUCATION:  Education  details: HEP update Person educated: Patient Education method: Explanation, Demonstration, Tactile cues, Verbal cues, and Handouts Education comprehension: verbalized understanding, returned demonstration, verbal cues required, tactile cues required, and needs further education  HOME EXERCISE PROGRAM: Access Code: [email or web address] URL: [email or web address] Date: 10/07/2023 Prepared by: Letitia Libra  Exercises - Hooklying Hamstring Stretch with Strap  - 2 x daily - 7 x weekly - 1 sets - 3 reps - 30 sec  hold - Prone Quadriceps Set  - 2 x daily - 7 x weekly - 1-2 sets - 10 reps - 5-10 sec  hold - Seated Hamstring Stretch  - 2 x daily - 7 x weekly - 1 sets - 3 reps - 30 sec  hold - Standing Terminal Knee Extension at Wall with Ball  - 2 x daily - 7 x weekly - 1-2 sets - 10 reps - 5-10 sec  hold - Standing Heel Raises  - 1 x daily - 4 x weekly - 3 sets - 10 reps - 3 sec hold - Seated Gastroc Stretch with Strap  - 2 x daily - 7 x weekly - 1 sets - 3-5 reps - 20-30 sec hold - Seated Soleus Stretch with Strap  - 2 x daily - 7 x weekly - 1 sets - 3-5 reps - 20-30 sec hold - Side Stepping with Resistance at Ankles and Counter Support  - 1 x daily - 7 x weekly - 3 sets - 10 reps - Sidelying Hip  Abduction  - 1 x daily - 7 x weekly - 2 sets - 10 reps - Supine Active Straight Leg Raise  - 1 x daily - 7 x weekly - 2 sets - 10 reps - Supine Bridge  - 1 x daily - 7 x weekly - 2 sets - 10 reps - Seated Long Arc Quad  - 1 x daily - 7 x weekly - 2 sets - 10 reps  ASSESSMENT:  CLINICAL IMPRESSION: Focused on hip strengthening today to improve distal stability with good tolerance. Slight improvement noted in Lt knee extension AROM compared to initial evaluation, but remains limited. She tolerated progression of strengthening well reporting slight reduction in lower leg pain at conclusion of session. Utilized ionto again as patient feels this has been helpful in reducing her pain.   Eval: Patient is  a 57 y.o. female who was seen today for physical therapy evaluation and treatment for L knee pain with no known injury. She had gradual onset of L knee and hamstring pain ~ 6 months ago. She received a cortisone injection for the L knee ~ 1 week ago with good improvement in the L knee pain. The L hamstring pain has continued. She also has some pain in the L calf. Patient presents with antalgic gait; significant limitations in L LE ROM; strength; functional activity tolerance. Patient will benefit from PT to address problems identified.   OBJECTIVE IMPAIRMENTS: Abnormal gait, decreased activity tolerance, decreased balance, decreased mobility, difficulty walking, decreased ROM, decreased strength, increased edema, increased fascial restrictions, impaired flexibility, postural dysfunction, and pain.   ACTIVITY LIMITATIONS: carrying, lifting, bending, sitting, standing, squatting, stairs, transfers, and locomotion level  PARTICIPATION LIMITATIONS: meal prep, cleaning, laundry, driving, shopping, community activity, occupation, and yard work  PERSONAL FACTORS: Behavior pattern, Education, Fitness, Past/current experiences, and Time since onset of injury/illness/exacerbation are also affecting patient's functional outcome.   REHAB POTENTIAL: Good  CLINICAL DECISION MAKING: Stable/uncomplicated  EVALUATION COMPLEXITY: Low   GOALS: Goals reviewed with patient? Yes  SHORT TERM GOALS: Target date: 10/14/2023  Independent in initial HEP  Baseline: Goal status: MET  2.  Increase AROM L knee to  0 deg extension and 115 deg flexion  Baseline:  Goal status: progressing    LONG TERM GOALS: Target date: 11/25/2023  Improve gait pattern with patient to demonstrate more normal gait pattern with good wt shift and wt bearing L LE in stance to toe off L LE Baseline:  Goal status: INITIAL  2.  Full pain free ROM L knee  Baseline:  Goal status: INITIAL  3.  5/5 strength L LE  Baseline:  Goal  status: INITIAL  4.  Patient reports return to all normal functional activities  Baseline:  Goal status: INITIAL  5.  Independent in HEP, including aquatic program as indicated  Baseline:  Goal status: INITIAL  6.  Improve functional limitation score to 48 Baseline:  Goal status: INITIAL   PLAN:  PT FREQUENCY: 2x/week  PT DURATION: 12 weeks  PLANNED INTERVENTIONS: Therapeutic exercises, Therapeutic activity, Neuromuscular re-education, Balance training, Gait training, Patient/Family education, Self Care, Joint mobilization, Stair training, Aquatic Therapy, Dry Needling, Electrical stimulation, Spinal mobilization, Cryotherapy, Moist heat, Taping, Vasopneumatic device, Ultrasound, Ionotophoresis 4mg /ml Dexamethasone, Manual therapy, and Re-evaluation  PLAN FOR NEXT SESSION: n/a d/c   Letitia Libra, PT, DPT, ATC 10/07/23 5:01 PM  Letitia Libra, PT, DPT, ATC 02/29/24 1:19 PM

## 2023-10-20 ENCOUNTER — Ambulatory Visit: Payer: 59 | Attending: Orthopedic Surgery

## 2023-10-27 ENCOUNTER — Ambulatory Visit: Payer: 59 | Attending: Orthopedic Surgery

## 2023-10-27 DIAGNOSIS — R29898 Other symptoms and signs involving the musculoskeletal system: Secondary | ICD-10-CM | POA: Insufficient documentation

## 2023-10-27 DIAGNOSIS — M1712 Unilateral primary osteoarthritis, left knee: Secondary | ICD-10-CM | POA: Insufficient documentation

## 2023-10-27 DIAGNOSIS — M25662 Stiffness of left knee, not elsewhere classified: Secondary | ICD-10-CM | POA: Insufficient documentation

## 2023-10-27 DIAGNOSIS — M25562 Pain in left knee: Secondary | ICD-10-CM | POA: Insufficient documentation

## 2023-10-27 DIAGNOSIS — M6281 Muscle weakness (generalized): Secondary | ICD-10-CM | POA: Insufficient documentation

## 2023-10-27 DIAGNOSIS — G8929 Other chronic pain: Secondary | ICD-10-CM | POA: Insufficient documentation

## 2023-10-28 ENCOUNTER — Ambulatory Visit: Payer: 59 | Admitting: Medical-Surgical

## 2023-10-28 ENCOUNTER — Encounter: Payer: Self-pay | Admitting: Medical-Surgical

## 2023-10-28 VITALS — BP 127/84 | HR 70 | Resp 20 | Ht 66.0 in | Wt 207.1 lb

## 2023-10-28 DIAGNOSIS — I10 Essential (primary) hypertension: Secondary | ICD-10-CM | POA: Diagnosis not present

## 2023-10-28 DIAGNOSIS — E782 Mixed hyperlipidemia: Secondary | ICD-10-CM

## 2023-10-28 DIAGNOSIS — J4489 Other specified chronic obstructive pulmonary disease: Secondary | ICD-10-CM | POA: Diagnosis not present

## 2023-10-28 DIAGNOSIS — M7662 Achilles tendinitis, left leg: Secondary | ICD-10-CM

## 2023-10-28 DIAGNOSIS — F3341 Major depressive disorder, recurrent, in partial remission: Secondary | ICD-10-CM

## 2023-10-28 DIAGNOSIS — Z23 Encounter for immunization: Secondary | ICD-10-CM

## 2023-10-28 DIAGNOSIS — R7303 Prediabetes: Secondary | ICD-10-CM | POA: Diagnosis not present

## 2023-10-28 DIAGNOSIS — J439 Emphysema, unspecified: Secondary | ICD-10-CM

## 2023-10-28 LAB — POCT GLYCOSYLATED HEMOGLOBIN (HGB A1C)
HbA1c, POC (prediabetic range): 6 % (ref 5.7–6.4)
Hemoglobin A1C: 6 % — AB (ref 4.0–5.6)

## 2023-10-28 MED ORDER — LISINOPRIL 20 MG PO TABS: 20.0000 mg | ORAL_TABLET | Freq: Every day | ORAL | 3 refills | Status: AC

## 2023-10-28 MED ORDER — ROSUVASTATIN CALCIUM 20 MG PO TABS: 20.0000 mg | ORAL_TABLET | Freq: Every day | ORAL | 3 refills | Status: AC

## 2023-10-28 MED ORDER — RYBELSUS 7 MG PO TABS: 7.0000 mg | ORAL_TABLET | Freq: Every day | ORAL | 3 refills | Status: AC

## 2023-10-28 NOTE — Progress Notes (Signed)
Established patient visit  History, exam, impression, and plan:  1. Prediabetes Very pleasant 57 year old female presenting today for follow-up on prediabetes.  Her last hemoglobin A1c was at 5.9% approximately 6 months ago.  Has been taking Rybelsus 7 mg daily.  She has been working on eating healthier and has managed to lose greater than 20 pounds since her last visit here.  Has been trying to cut back on sodas, increasing water instead.  Has tried to avoid eating concentrated sweets and has cut back on carbohydrates.  POCT hemoglobin A1c today at 6.0%.  Very mild elevation of A1c but overall still well-controlled.  Continue Rybelsus 7 mg daily.  Continue working on dietary and lifestyle modifications with a goal for weight loss to a healthy weight. - POCT HgB A1C  2. Essential hypertension Currently taking lisinopril 20 mg daily, tolerating well without side effects.  Occasionally monitoring blood pressures.  No regular intentional exercise.  Denies concerning symptoms today.  Blood pressure is at goal.  Continue lisinopril as prescribed. - lisinopril (ZESTRIL) 20 MG tablet; Take 1 tablet (20 mg total) by mouth daily.  Dispense: 90 tablet; Refill: 3  3. Hyperlipidemia, mixed Taking Crestor 20 mg daily, tolerating well without side effects.  Up-to-date on lipid checks.  Continue Crestor as prescribed. - rosuvastatin (CRESTOR) 20 MG tablet; Take 1 tablet (20 mg total) by mouth daily.  Dispense: 90 tablet; Refill: 3  4. COPD with chronic bronchitis and emphysema (HCC) Continues to smoke but is aware of recommendations to quit.  No significant concerns with shortness of breath, cough, chest congestion, or orthopnea.  Lungs CTA.  Respirations even and unlabored.  Continue Stiolto and albuterol as prescribed.  5. Recurrent major depressive disorder, in partial remission (HCC) Taking Effexor 75 mg daily and feels that this is working well for her.  Since exercising, has noted that her mood  is much better.  Denies SI/HI.  Continue Effexor as prescribed.  6. Need for influenza vaccination Flu vaccine given in office today. - Flu vaccine trivalent PF, 6mos and older(Flulaval,Afluria,Fluarix,Fluzone)  7. Achilles tendinitis of left lower extremity Has a large erythematous, tender lump directly over the left Achilles tendon that is painful to manipulate.  She has been doing stretches at home but nothing seems to help.  Exam findings consistent with Achilles tendinitis.  Exercises for home therapy printed and provided with patient.  Recommend anti-inflammatory therapy and/or topical Voltaren gel.  Heel lifts provided to place in both shoes.  Consider bracing for comfort if on her feet for long periods of time.  If no improvement in 4-6 weeks, return for further evaluation with Dr. Benjamin Stain.   Review of Systems  Constitutional:  Negative for chills, fever and malaise/fatigue.  Respiratory:  Negative for cough, sputum production, shortness of breath and wheezing.   Cardiovascular:  Negative for chest pain, leg swelling and PND.  Psychiatric/Behavioral:  Positive for depression. Negative for suicidal ideas. The patient is nervous/anxious. The patient does not have insomnia.    Physical Exam Vitals reviewed.  Constitutional:      General: She is not in acute distress.    Appearance: Normal appearance. She is obese. She is not ill-appearing.  HENT:     Head: Normocephalic and atraumatic.  Cardiovascular:     Rate and Rhythm: Normal rate and regular rhythm.     Pulses: Normal pulses.     Heart sounds: Normal heart sounds. No murmur heard.    No friction  rub. No gallop.  Pulmonary:     Effort: Pulmonary effort is normal. No respiratory distress.     Breath sounds: Normal breath sounds. No wheezing.  Skin:    General: Skin is warm and dry.  Neurological:     Mental Status: She is alert and oriented to person, place, and time.  Psychiatric:        Mood and Affect: Mood normal.         Behavior: Behavior normal.        Thought Content: Thought content normal.        Judgment: Judgment normal.   Procedures performed this visit: None.  Return in about 6 months (around 04/26/2024) for chronic disease follow up.  __________________________________ Thayer Ohm, DNP, APRN, FNP-BC Primary Care and Sports Medicine [facility] [place]

## 2024-01-13 ENCOUNTER — Telehealth: Payer: 59 | Admitting: Medical-Surgical

## 2024-01-13 ENCOUNTER — Encounter: Payer: Self-pay | Admitting: Medical-Surgical

## 2024-01-13 DIAGNOSIS — J329 Chronic sinusitis, unspecified: Secondary | ICD-10-CM | POA: Diagnosis not present

## 2024-01-13 DIAGNOSIS — J40 Bronchitis, not specified as acute or chronic: Secondary | ICD-10-CM | POA: Diagnosis not present

## 2024-01-13 MED ORDER — METHYLPREDNISOLONE 4 MG PO TBPK: ORAL_TABLET | ORAL | 0 refills | Status: DC

## 2024-01-13 MED ORDER — PROMETHAZINE-DM 6.25-15 MG/5ML PO SYRP: 5.0000 mL | ORAL_SOLUTION | Freq: Four times a day (QID) | ORAL | 0 refills | Status: AC | PRN

## 2024-01-13 MED ORDER — AMOXICILLIN-POT CLAVULANATE 875-125 MG PO TABS
1.0000 | ORAL_TABLET | Freq: Two times a day (BID) | ORAL | 0 refills | Status: DC
Start: 2024-01-13 — End: 2024-04-27

## 2024-01-13 NOTE — Progress Notes (Signed)
 Virtual Visit via Telephone   I connected with  Jasmine Mejia  on 01/13/24 by telephone/telehealth and verified that I am speaking with the correct person using two identifiers.   I discussed the limitations, risks, security and privacy concerns of performing an evaluation and management service by telephone, including the higher likelihood of inaccurate diagnosis and treatment, and the availability of in person appointments.  We also discussed the likely need of an additional face to face encounter for complete and high quality delivery of care.  I also discussed with the patient that there may be a patient responsible charge related to this service. The patient expressed understanding and wishes to proceed.  Provider location is in medical facility. Patient location is at their home, different from provider location. People involved in care of the patient during this telehealth encounter were myself, my nurse/medical assistant, and my front office/scheduling team member.  CC: Sinus and chest congestion  HPI: Pleasant 58 year old female presenting via MyChart video visit with complaints of approximately 3 weeks of upper respiratory symptoms that started as a cold/viral infection but has persisted.  She has been using Alka-Seltzer cold, nasal sprays, and Mucinex with minimal relief of symptoms.  She recently had fever and chills along with sore throat however those have resolved.  Now she has significant sinus infection, maxillary and frontal facial pain, cough productive of greenish-yellow sputum, and chest congestion.  Having difficulty sleeping at night due to the cough.  Of note, she reports that the issue with her heel has not gotten any better.  She does have pads in her shoes to help raise her heel however it does not seem to help much.  She has a lump on the back of her heel that is very painful to touch.  She would like to have imaging done but would like to hold off on getting that worked  up until she starts to feel better with her respiratory symptoms.  She will let me know via MyChart.  Review of Systems: See HPI for pertinent positives and negatives.   Objective Findings:    General: Speaking full sentences, no audible heavy breathing.  Sounds alert and appropriately interactive.    Impression and Recommendations:    1. Sinobronchitis (Primary) After 3 weeks of upper respiratory symptoms that have persisted in the setting of COPD, plan to treat with Augmentin  twice daily x 10 days.  Adding Medrol  Dosepak.  Also adding Promethazine  DM for cough.  Advised her to use this mostly at night as it will be sedating.  If not getting better with the symptoms by the end of treatment, advised her to reach out and we may need to do an in person exam and get a chest x-ray for further evaluation. - amoxicillin -clavulanate (AUGMENTIN ) 875-125 MG tablet; Take 1 tablet by mouth 2 (two) times daily.  Dispense: 20 tablet; Refill: 0 - methylPREDNISolone  (MEDROL  DOSEPAK) 4 MG TBPK tablet; Take as directed.  Dispense: 21 tablet; Refill: 0 - promethazine -dextromethorphan (PROMETHAZINE -DM) 6.25-15 MG/5ML syrup; Take 5 mLs by mouth 4 (four) times daily as needed.  Dispense: 118 mL; Refill: 0  I discussed the above assessment and treatment plan with the patient. The patient was provided an opportunity to ask questions and all were answered. The patient agreed with the plan and demonstrated an understanding of the instructions.   The patient was advised to call back or seek an in-person evaluation if the symptoms worsen or if the condition fails to improve as anticipated.  Return if symptoms worsen or fail to improve. ___________________________________________ Jasmine Palin, DNP, APRN, FNP-BC Primary Care and Sports Medicine [facility] [place]

## 2024-03-07 ENCOUNTER — Telehealth: Payer: Self-pay

## 2024-03-07 DIAGNOSIS — Z122 Encounter for screening for malignant neoplasm of respiratory organs: Secondary | ICD-10-CM

## 2024-03-07 NOTE — Telephone Encounter (Signed)
 Order placed for lung cancer screening. Not due to follow up on nodules until 09/2024.

## 2024-03-07 NOTE — Addendum Note (Signed)
 Addended byChristen Butter on: 03/07/2024 12:33 PM   Modules accepted: Orders

## 2024-03-07 NOTE — Telephone Encounter (Signed)
 Copied from CRM [id number]. Topic: Clinical - Request for Lab/Test Order >> Mar 07, 2024 11:20 AM Jasmine Mejia wrote: Reason for CRM: Patient is wanting to schedule yearly chest CT, requests for orders to be placed for imaging. Please call to advise when to schedule

## 2024-03-08 ENCOUNTER — Ambulatory Visit

## 2024-03-08 DIAGNOSIS — F17210 Nicotine dependence, cigarettes, uncomplicated: Secondary | ICD-10-CM

## 2024-03-08 DIAGNOSIS — Z122 Encounter for screening for malignant neoplasm of respiratory organs: Secondary | ICD-10-CM

## 2024-03-08 NOTE — Telephone Encounter (Signed)
 Attempted call to patient. Left a voice mail message requesting a return call.

## 2024-03-09 NOTE — Telephone Encounter (Signed)
 Patient informed.

## 2024-04-12 ENCOUNTER — Encounter: Payer: Self-pay | Admitting: Medical-Surgical

## 2024-04-12 DIAGNOSIS — I2721 Secondary pulmonary arterial hypertension: Secondary | ICD-10-CM

## 2024-04-27 ENCOUNTER — Encounter: Payer: Self-pay | Admitting: Medical-Surgical

## 2024-04-27 ENCOUNTER — Ambulatory Visit (INDEPENDENT_AMBULATORY_CARE_PROVIDER_SITE_OTHER): Payer: 59 | Admitting: Medical-Surgical

## 2024-04-27 VITALS — BP 148/93 | HR 73 | Resp 20 | Ht 66.0 in | Wt 203.0 lb

## 2024-04-27 DIAGNOSIS — R7303 Prediabetes: Secondary | ICD-10-CM

## 2024-04-27 DIAGNOSIS — F419 Anxiety disorder, unspecified: Secondary | ICD-10-CM | POA: Diagnosis not present

## 2024-04-27 DIAGNOSIS — I10 Essential (primary) hypertension: Secondary | ICD-10-CM | POA: Diagnosis not present

## 2024-04-27 DIAGNOSIS — M25572 Pain in left ankle and joints of left foot: Secondary | ICD-10-CM | POA: Diagnosis not present

## 2024-04-27 DIAGNOSIS — G8929 Other chronic pain: Secondary | ICD-10-CM

## 2024-04-27 LAB — POCT GLYCOSYLATED HEMOGLOBIN (HGB A1C): Hemoglobin A1C: 5.7 % — AB (ref 4.0–5.6)

## 2024-04-27 NOTE — Progress Notes (Signed)
        Established patient visit  History, exam, impression, and plan:  1. Essential hypertension Pleasant 58 year old female presenting today with a history of HTN. She is prescribed Lisinopril  20mg  daily but admits that she lost her medication about a month ago. She had some extra pills that she had taken to work with her so has been using those but not taking them every day. Not regularly checking BP at home. Denies CP, SOB, palpitations, lower extremity edema, dizziness, headaches, or vision changes. Cardiopulmonary exam is normal. BP elevated on arrival and again on recheck. Checking labs today. Refilling Lisinopril  20mg  daily. Restart daily dosing. Monitor BP at home with a goal for less than 130/80 or less. If BP still elevated on daily dosing after 2 weeks, may need medication adjustment.  - CBC with Differential/Platelet - CMP14+EGFR - Lipid panel  2. Prediabetes (Primary) History of prediabetes with last check at 6.0% 6 months ago. Has been working on cutting out sweets and simple carbs. Exercise limited by orthopedic conditions and pain. Recheck of POCT A1c today at 5.7%, still prediabetic but improved. Continue to work on diet/exercise/weight control.  - POCT HgB A1C  3. Anxiety Currently taking Zoloft  200mg  daily, tolerating well without side effects. Feels the medication is working well although she does have some anxiety regarding her children since she found out their father has terminal cancer. She does not feel that she needs a change in medication or other intervention at this time as her anxiety is more situational. Denies SI/HI. Continue Zoloft  200mg  daily.   4. Chronic pain of left ankle Reports 7-8 months of left ankle pain posteriorly where there is a lump. The area is tender to touch and painful with flexion/extension of the left ankle.The lump is slightly mobile along the insertion of the achilles tendon. Report physical therapy knew about it and they put a patch on it  once. Has not been using anything on it since then. Reports it is painful to walk. Plan for nitroglycerine patch to the area q 24 hours. Placed in a walking boot. Plan to follow up with Dr. Elva Hamburger at her earliest convenience for further investigation and treatment.   Procedures performed this visit: None.  Return in about 6 months (around 10/28/2024) for chronic disease follow up.  __________________________________ Maryl Snook, DNP, APRN, FNP-BC Primary Care and Sports Medicine [facility] [place]

## 2024-04-28 LAB — CMP14+EGFR
ALT: 23 IU/L (ref 0–32)
AST: 22 IU/L (ref 0–40)
Albumin: 4.7 g/dL (ref 3.8–4.9)
Alkaline Phosphatase: 107 IU/L (ref 44–121)
BUN/Creatinine Ratio: 18 (ref 9–23)
BUN: 19 mg/dL (ref 6–24)
Bilirubin Total: 0.3 mg/dL (ref 0.0–1.2)
CO2: 22 mmol/L (ref 20–29)
Calcium: 9.6 mg/dL (ref 8.7–10.2)
Chloride: 103 mmol/L (ref 96–106)
Creatinine, Ser: 1.05 mg/dL — ABNORMAL HIGH (ref 0.57–1.00)
Globulin, Total: 2.5 g/dL (ref 1.5–4.5)
Glucose: 91 mg/dL (ref 70–99)
Potassium: 4 mmol/L (ref 3.5–5.2)
Sodium: 143 mmol/L (ref 134–144)
Total Protein: 7.2 g/dL (ref 6.0–8.5)
eGFR: 63 mL/min/{1.73_m2} (ref >59)

## 2024-04-28 LAB — CBC WITH DIFFERENTIAL/PLATELET
Basophils Absolute: 0 10*3/uL (ref 0.0–0.2)
Basos: 0 %
EOS (ABSOLUTE): 0.3 10*3/uL (ref 0.0–0.4)
Eos: 3 %
Hematocrit: 43.4 % (ref 34.0–46.6)
Hemoglobin: 14.5 g/dL (ref 11.1–15.9)
Immature Grans (Abs): 0 10*3/uL (ref 0.0–0.1)
Immature Granulocytes: 0 %
Lymphocytes Absolute: 3.5 10*3/uL — ABNORMAL HIGH (ref 0.7–3.1)
Lymphs: 36 %
MCH: 30.3 pg (ref 26.6–33.0)
MCHC: 33.4 g/dL (ref 31.5–35.7)
MCV: 91 fL (ref 79–97)
Monocytes Absolute: 0.5 10*3/uL (ref 0.1–0.9)
Monocytes: 5 %
Neutrophils Absolute: 5.5 10*3/uL (ref 1.4–7.0)
Neutrophils: 56 %
Platelets: 326 10*3/uL (ref 150–450)
RBC: 4.79 x10E6/uL (ref 3.77–5.28)
RDW: 12.6 % (ref 11.7–15.4)
WBC: 9.8 10*3/uL (ref 3.4–10.8)

## 2024-04-28 LAB — LIPID PANEL
Chol/HDL Ratio: 4.8 ratio — ABNORMAL HIGH (ref 0.0–4.4)
Cholesterol, Total: 276 mg/dL — ABNORMAL HIGH (ref 100–199)
HDL: 58 mg/dL (ref >39)
LDL Chol Calc (NIH): 184 mg/dL — ABNORMAL HIGH (ref 0–99)
Triglycerides: 181 mg/dL — ABNORMAL HIGH (ref 0–149)
VLDL Cholesterol Cal: 34 mg/dL (ref 5–40)

## 2024-04-28 LAB — SPECIMEN STATUS REPORT

## 2024-04-29 ENCOUNTER — Ambulatory Visit: Payer: Self-pay | Admitting: Medical-Surgical

## 2024-04-29 ENCOUNTER — Encounter: Payer: Self-pay | Admitting: Medical-Surgical

## 2024-04-29 MED ORDER — NITROGLYCERIN 0.2 MG/HR TD PT24
MEDICATED_PATCH | TRANSDERMAL | 11 refills | Status: DC
Start: 2024-04-29 — End: 2024-05-04

## 2024-05-04 ENCOUNTER — Ambulatory Visit (INDEPENDENT_AMBULATORY_CARE_PROVIDER_SITE_OTHER): Admitting: Sports Medicine

## 2024-05-04 DIAGNOSIS — M67874 Other specified disorders of tendon, left ankle and foot: Secondary | ICD-10-CM

## 2024-05-04 MED ORDER — NITROGLYCERIN 0.2 MG/HR TD PT24: MEDICATED_PATCH | TRANSDERMAL | 11 refills | Status: AC

## 2024-05-04 NOTE — Assessment & Plan Note (Signed)
 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 She has developed a painful nodule left Achilles for many months. She was seen by her PCP and placed in a boot, today on exam she has a visible and palpable tender mid Achilles nodule, negative Thompson's test. I explained anatomy and pathophysiology of Achilles tendinosis, we will add bilateral heel lifts 1 in the boot 1 in her contralateral shoe. Topical nitroglycerin , eccentric aggressive physical therapy, she understands this can take 8 weeks or longer to heal, return to see me in 6 weeks.

## 2024-05-04 NOTE — Progress Notes (Signed)
    Procedures performed today:    None.  Independent interpretation of notes and tests performed by another provider:   None.  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 She has developed a painful nodule left Achilles for many months. She was seen by her PCP and placed in a boot, today on exam she has a visible and palpable tender mid Achilles nodule, negative Thompson's test. I explained anatomy and pathophysiology of Achilles tendinosis, we will add bilateral heel lifts 1 in the boot 1 in her contralateral shoe. Topical nitroglycerin, eccentric aggressive physical therapy, she understands this can take 8 weeks or longer to heal, return to see me in 6 weeks.    ____________________________________________ Joselyn Nicely. Sandy Crumb, M.D., ABFM., CAQSM., AME. Primary Care and Sports Medicine [facility]  Adjunct Professor of [facility] Medicine  [employer]  Restaurant manager, [employer]

## 2024-06-06 ENCOUNTER — Ambulatory Visit: Attending: Sports Medicine | Admitting: Physical Therapy

## 2024-06-19 ENCOUNTER — Ambulatory Visit: Admitting: Sports Medicine

## 2024-06-26 ENCOUNTER — Ambulatory Visit: Admitting: Sports Medicine

## 2024-06-28 ENCOUNTER — Ambulatory Visit: Admitting: Pulmonary Disease

## 2024-07-04 ENCOUNTER — Ambulatory Visit: Admitting: Sports Medicine

## 2024-07-12 ENCOUNTER — Ambulatory Visit (INDEPENDENT_AMBULATORY_CARE_PROVIDER_SITE_OTHER): Admitting: Sports Medicine

## 2024-07-12 ENCOUNTER — Ambulatory Visit (INDEPENDENT_AMBULATORY_CARE_PROVIDER_SITE_OTHER)

## 2024-07-12 ENCOUNTER — Other Ambulatory Visit (INDEPENDENT_AMBULATORY_CARE_PROVIDER_SITE_OTHER)

## 2024-07-12 DIAGNOSIS — Z0189 Encounter for other specified special examinations: Secondary | ICD-10-CM | POA: Diagnosis not present

## 2024-07-12 DIAGNOSIS — M1712 Unilateral primary osteoarthritis, left knee: Secondary | ICD-10-CM

## 2024-07-12 DIAGNOSIS — M67874 Other specified disorders of tendon, left ankle and foot: Secondary | ICD-10-CM

## 2024-07-12 NOTE — Assessment & Plan Note (Signed)
 Pleasant 58 year old female, she has a history of a right knee arthroplasty, she is having increasing pain at the left knee, medial joint line, no mechanical symptoms, at this point she desires an injection, happy to do this today, return to see me in 6 weeks. Like some updated x-rays today.

## 2024-07-12 NOTE — Assessment & Plan Note (Signed)
 Improved considerably with eccentric physical therapy, topical nitroglycerin , we will continue the course for now.

## 2024-07-12 NOTE — Progress Notes (Signed)
    Procedures performed today:    Procedure: Real-time Ultrasound Guided injection of the left knee Device: Samsung HS60  Verbal informed consent obtained.  Time-out conducted.  Noted no overlying erythema, induration, or other signs of local infection.  Skin prepped in a sterile fashion.  Local anesthesia: Topical Ethyl chloride.  With sterile technique and under real time ultrasound guidance: Effusion noted, 1 cc Kenalog  40, 2 cc lidocaine , 2 cc bupivacaine  injected easily Completed without difficulty  Advised to call if fevers/chills, erythema, induration, drainage, or persistent bleeding.  Images permanently stored and available for review in PACS.  Impression: Technically successful ultrasound guided injection.  Independent interpretation of notes and tests performed by another provider:   None.  Brief History, Exam, Impression, and Recommendations:    Primary osteoarthritis of left knee status post right knee arthroplasty Pleasant 58 year old female, she has a history of a right knee arthroplasty, she is having increasing pain at the left knee, medial joint line, no mechanical symptoms, at this point she desires an injection, happy to do this today, return to see me in 6 weeks. Like some updated x-rays today.  Achilles tendinosis of left ankle Improved considerably with eccentric physical therapy, topical nitroglycerin , we will continue the course for now.    ____________________________________________ Debby PARAS. Curtis, M.D., ABFM., CAQSM., AME. Primary Care and Sports Medicine [facility]  Adjunct Professor of [facility] Medicine  [employer]  Restaurant manager, fast [employer]

## 2024-07-13 MED ORDER — TRIAMCINOLONE ACETONIDE 40 MG/ML IJ SUSP
40.0000 mg | Freq: Once | INTRAMUSCULAR | Status: AC
  Administered 2024-07-13: 40 mg via INTRA_ARTICULAR

## 2024-07-13 NOTE — Addendum Note (Signed)
 Addended by: OLEY CHIQUITA CROME on: 07/13/2024 09:16 AM   Modules accepted: Orders

## 2024-08-01 ENCOUNTER — Encounter: Payer: Self-pay | Admitting: Medical-Surgical

## 2024-08-03 MED ORDER — CYCLOBENZAPRINE HCL 10 MG PO TABS: 5.0000 mg | ORAL_TABLET | Freq: Three times a day (TID) | ORAL | 0 refills | Status: AC | PRN

## 2024-08-08 ENCOUNTER — Encounter: Payer: Self-pay | Admitting: Sports Medicine

## 2024-08-18 ENCOUNTER — Ambulatory Visit

## 2024-08-19 ENCOUNTER — Other Ambulatory Visit: Payer: Self-pay | Admitting: Medical-Surgical

## 2024-08-19 DIAGNOSIS — I10 Essential (primary) hypertension: Secondary | ICD-10-CM

## 2024-08-21 ENCOUNTER — Ambulatory Visit: Admitting: Sports Medicine

## 2024-08-23 ENCOUNTER — Ambulatory Visit: Admitting: Pulmonary Disease

## 2024-10-17 ENCOUNTER — Ambulatory Visit: Admitting: Pulmonary Disease

## 2024-10-17 ENCOUNTER — Encounter: Payer: Self-pay | Admitting: Pulmonary Disease

## 2025-01-01 ENCOUNTER — Encounter: Admitting: Pulmonary Disease
